# Patient Record
Sex: Male | Born: 1995 | Race: Black or African American | Hispanic: No | Marital: Single | Smoking: Current every day smoker
Health system: Southern US, Community
[De-identification: ages and names within clinical notes are randomized; demographics above are authoritative.]

## PROBLEM LIST (undated history)

## (undated) DIAGNOSIS — F419 Anxiety disorder, unspecified: Secondary | ICD-10-CM

## (undated) DIAGNOSIS — F209 Schizophrenia, unspecified: Secondary | ICD-10-CM

## (undated) HISTORY — PX: WRIST SURGERY: SHX841

## (undated) HISTORY — PX: MANDIBLE SURGERY: SHX707

---
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
## 2013-12-01 ENCOUNTER — Encounter (HOSPITAL_COMMUNITY): Payer: Self-pay | Admitting: Emergency Medicine

## 2013-12-01 ENCOUNTER — Emergency Department (HOSPITAL_COMMUNITY)
Admission: EM | Admit: 2013-12-01 | Discharge: 2013-12-02 | Disposition: A | Discharge status: Other Acute Inpatient Hospital | Payer: BC Managed Care – PPO | Attending: Emergency Medicine | Admitting: Emergency Medicine

## 2013-12-01 DIAGNOSIS — R4689 Other symptoms and signs involving appearance and behavior: Secondary | ICD-10-CM

## 2013-12-01 DIAGNOSIS — F919 Conduct disorder, unspecified: Secondary | ICD-10-CM | POA: Insufficient documentation

## 2013-12-01 DIAGNOSIS — F1210 Cannabis abuse, uncomplicated: Secondary | ICD-10-CM | POA: Insufficient documentation

## 2013-12-01 DIAGNOSIS — F1290 Cannabis use, unspecified, uncomplicated: Secondary | ICD-10-CM

## 2013-12-01 DIAGNOSIS — R41840 Attention and concentration deficit: Secondary | ICD-10-CM | POA: Insufficient documentation

## 2013-12-01 LAB — COMPREHENSIVE METABOLIC PANEL
ALBUMIN: 4.9 g/dL (ref 3.5–5.2)
ALT: 16 U/L (ref 0–53)
AST: 18 U/L (ref 0–37)
Alkaline Phosphatase: 80 U/L (ref 39–117)
BUN: 10 mg/dL (ref 6–23)
CO2: 25 meq/L (ref 19–32)
CREATININE: 1 mg/dL (ref 0.50–1.35)
Calcium: 10 mg/dL (ref 8.4–10.5)
Chloride: 98 mEq/L (ref 96–112)
GFR calc Af Amer: >90 mL/min (ref >90)
Glucose, Bld: 122 mg/dL — ABNORMAL HIGH (ref 70–99)
Potassium: 3.3 mEq/L — ABNORMAL LOW (ref 3.7–5.3)
SODIUM: 138 meq/L (ref 137–147)
Total Bilirubin: 0.9 mg/dL (ref 0.3–1.2)
Total Protein: 8.1 g/dL (ref 6.0–8.3)

## 2013-12-01 LAB — CBC
HCT: 44.3 % (ref 39.0–52.0)
Hemoglobin: 16.2 g/dL (ref 13.0–17.0)
MCH: 30.2 pg (ref 26.0–34.0)
MCHC: 36.6 g/dL — ABNORMAL HIGH (ref 30.0–36.0)
MCV: 82.5 fL (ref 78.0–100.0)
PLATELETS: 240 10*3/uL (ref 150–400)
RBC: 5.37 MIL/uL (ref 4.22–5.81)
RDW: 12 % (ref 11.5–15.5)
WBC: 8 10*3/uL (ref 4.0–10.5)

## 2013-12-01 LAB — RAPID URINE DRUG SCREEN, HOSP PERFORMED
AMPHETAMINES: NOT DETECTED
BENZODIAZEPINES: NOT DETECTED
Barbiturates: NOT DETECTED
Cocaine: NOT DETECTED
Opiates: NOT DETECTED
TETRAHYDROCANNABINOL: POSITIVE — AB

## 2013-12-01 LAB — ACETAMINOPHEN LEVEL: Acetaminophen (Tylenol), Serum: <15 ug/mL (ref 10–30)

## 2013-12-01 LAB — SALICYLATE LEVEL

## 2013-12-01 LAB — ETHANOL: Alcohol, Ethyl (B): <11 mg/dL (ref 0–11)

## 2013-12-01 MED ORDER — ZOLPIDEM TARTRATE 5 MG PO TABS
5.0000 mg | ORAL_TABLET | Freq: Every evening | ORAL | Status: DC | PRN
  Administered 2013-12-01: 5 mg via ORAL
  Filled 2013-12-01: qty 1

## 2013-12-01 MED ORDER — LORAZEPAM 1 MG PO TABS
1.0000 mg | ORAL_TABLET | Freq: Three times a day (TID) | ORAL | Status: DC | PRN
  Administered 2013-12-01 – 2013-12-02 (×2): 1 mg via ORAL
  Filled 2013-12-01 (×3): qty 1

## 2013-12-01 MED ORDER — ALUM & MAG HYDROXIDE-SIMETH 200-200-20 MG/5ML PO SUSP: 30.0000 mL | ORAL | Status: DC | PRN

## 2013-12-01 MED ORDER — ONDANSETRON HCL 4 MG PO TABS: 4.0000 mg | ORAL_TABLET | Freq: Three times a day (TID) | ORAL | Status: DC | PRN

## 2013-12-01 MED ORDER — NICOTINE 21 MG/24HR TD PT24: 21.0000 mg | MEDICATED_PATCH | Freq: Every day | TRANSDERMAL | Status: DC

## 2013-12-01 MED ORDER — IBUPROFEN 200 MG PO TABS: 600.0000 mg | ORAL_TABLET | Freq: Three times a day (TID) | ORAL | Status: DC | PRN

## 2013-12-01 MED ORDER — ACETAMINOPHEN 325 MG PO TABS: 650.0000 mg | ORAL_TABLET | ORAL | Status: DC | PRN

## 2013-12-01 MED ORDER — POTASSIUM CHLORIDE CRYS ER 20 MEQ PO TBCR
40.0000 meq | EXTENDED_RELEASE_TABLET | Freq: Once | ORAL | Status: AC
  Administered 2013-12-01: 40 meq via ORAL
  Filled 2013-12-01: qty 2

## 2013-12-01 NOTE — BH Assessment (Signed)
2200 consulted with Vincent SamFran Hobson, NP and the Patient will be observed overnight and re-assessed in the morning.

## 2013-12-01 NOTE — ED Notes (Signed)
[phone] (Mother) [phone] (Father) to be called if pt transferred

## 2013-12-01 NOTE — BH Assessment (Signed)
Tele assessment scheduled for 2135 tonight.

## 2013-12-01 NOTE — ED Provider Notes (Signed)
CSN: [id number]     Arrival date & time 12/01/13  1922 History  This chart was scribed for non-physician practitioner Antony Madura, PA-C working with Richardean Canal, MD by Dorothey Baseman, ED Scribe. This patient was seen in room [facility] and the patient's care was started at 8:24 PM.    Chief Complaint  Patient presents with  . Medical Clearance   The history is provided by the patient and a parent (mother and father). No language interpreter was used.   HPI Comments: Vincent [name] is a 18 y.o. male who presents to the Emergency Department requesting medical clearance for dysphoric mood, including "thinking and doing random things," "like I'm having too many thoughts and have no control" onset earlier today. Patient also states that he will "stare at the sky" and patient is frequently looking around the room with a dazed appearance and often does not remember questions that were just asked. Patient is unable to specify if these symptoms came on suddenly or gradually, but denies any facilitating factors (i.e. physical trauma, extreme stress), although is father reports that the patient has experienced some recent increased stress secondary to adjusting to college life. His father states that his family noticed these behaviors after church and asked if he was okay, then the patient replied that he wanted to go the hospital. Patient states that these symptoms are new for him, which his parents confirm. Patient admits to marijuana use, but denies knowingly consuming alcohol or other drugs. He denies suicidal or homicidal ideations. Patient has no other pertinent medical history.   History reviewed. No pertinent past medical history. Past Surgical History  Procedure Laterality Date  . Mandible surgery     History reviewed. No pertinent family history. History  Substance Use Topics  . Smoking status: Never Smoker   . Smokeless tobacco: Never Used  . Alcohol Use: No    Review of Systems   Psychiatric/Behavioral: Positive for dysphoric mood and decreased concentration.  All other systems reviewed and are negative.    Allergies  Review of patient's allergies indicates no known allergies.  Home Medications   Current Outpatient Rx  Name  Route  Sig  Dispense  Refill  . Pseudoeph-Doxylamine-DM-APAP (NYQUIL PO)   Oral   Take 30 mLs by mouth every 4 (four) hours as needed (to get high).          Triage Vitals: Pulse 103  Temp(Src) 98.2 F (36.8 C) (Oral)  Resp 16  Ht 6\' 2"  (1.88 m)  Wt 180 lb (81.647 kg)  BMI 23.10 kg/m2  SpO2 99%  Physical Exam  Nursing note and vitals reviewed. Constitutional: He is oriented to person, place, and time. He appears well-developed and well-nourished. No distress.  HENT:  Head: Normocephalic and atraumatic.  Eyes: Conjunctivae and EOM are normal. No scleral icterus.  Neck: Normal range of motion.  Pulmonary/Chest: Effort normal. No respiratory distress.  Musculoskeletal: Normal range of motion.  Neurological: He is alert and oriented to person, place, and time.  GCS 15. Patient answers questions appropriately and follows simple commands. Moves extremities without ataxia. Patient is oriented to person, place, and time, but slow to respond.   Skin: Skin is warm and dry. No rash noted. He is not diaphoretic. No erythema. No pallor.  Psychiatric: His speech is delayed and tangential. He is slowed. He expresses no homicidal and no suicidal ideation. He is inattentive.    ED Course  Procedures (including critical care time)  DIAGNOSTIC STUDIES: Oxygen  Saturation is 99% on room air, normal by my interpretation.    COORDINATION OF CARE: 8:37 PM- Ordered blood labs and UA. Patient will consult to TTS. Will order Zofran, Nicoderm, Mylanta, Ambien, Tylenol, ibuprofen, and Ativan. Discussed treatment plan with patient and family at bedside and both verbalized agreement.    Labs Review Labs Reviewed  CBC - Abnormal; Notable for the  following:    MCHC 36.6 (*)    All other components within normal limits  COMPREHENSIVE METABOLIC PANEL - Abnormal; Notable for the following:    Potassium 3.3 (*)    Glucose, Bld 122 (*)    All other components within normal limits  SALICYLATE LEVEL - Abnormal; Notable for the following:    Salicylate Lvl <2.0 (*)    All other components within normal limits  URINE RAPID DRUG SCREEN (HOSP PERFORMED) - Abnormal; Notable for the following:    Tetrahydrocannabinol POSITIVE (*)    All other components within normal limits  ACETAMINOPHEN LEVEL  ETHANOL   Imaging Review No results found.   EKG Interpretation None      MDM   Final diagnoses:  Behavioral change  Marijuana use   18 year old male with a history of marijuana use presents to the emergency department with his parents after he started to behave unusually. Father states that patient is constantly staring into space and complaining of rushing thoughts. Patient denies SI/HI as well as hallucinations. He endorses smoking marijuana this afternoon, but mother states that this is not new for him and he has been smoking for approximately one year or to help with underlying anxiety. Patient evaluated by behavioral health who recommend observation until the morning followed by psychiatric eval in AM. Patient medically cleared. Temp psych hold orders placed.    I personally performed the services described in this documentation, which was scribed in my presence. The recorded information has been reviewed and is accurate.      Antony MaduraKelly Cynthea Zachman, PA-C 12/01/13 2256

## 2013-12-01 NOTE — ED Notes (Signed)
Per pt's mother, pt has been "talking out of his head and began crying when he was asked what was going on." Pt asked questions about SI/HI or AVH and will shake his head no, but stated "I don't feel safe where I am, but I don't know why." Pt states that today is Sunday, but is not able to state the month or year. Pt oriented to person and place. Pt calm and cooperative at this time. Denies any further needs at this time.

## 2013-12-01 NOTE — ED Notes (Signed)
Patient states that he is here because he just could not control his thoughts or his body; "all sorts of weird things were happening."

## 2013-12-01 NOTE — BH Assessment (Signed)
Tele Assessment Note   Vincent [name] is an 18 y.o. male.  The Patient reported he came to the ED because he was experiencing racing thoughts , AH, and VH.  He reported the racing thoughts were about God and the Devil.  The Patient reported hearing familiar voices but was unable to make out what they were saying.  He reported seeing angel wings in the sky and a Renato Gailsastor with spirits around him.  The Patient reported never experiencing an episode like this in the past.  The Patient denied current or past SI or HI.  The Patient reported smoking one Marijuana blunt after 1730 today.  He reported smoking a blunt daily since the age of 14 years.  He denied any other type of drug use.  The Patient reported experiencing sleep difficulty most nights when he is awakened by dreams and have difficulty getting back to sleep.  The Patient is employed and works at night.  He reported planning to enroll in college.  The Patient lives with his Mother and Sister.  The Patient permitted his Father and Mother to be present during the assessment and they provided collateral information.  The Patient's Mother reported he started acting "weird" in 2014.  She reported [place] had [place] but did returned home  in December 2014 because"he did not feel right."  The Mother reported the Patient had told her about difficulty sleeping at night due to anxiety.  The Patient's Father reported he was behaving normally this morning when they went to church and was fine when he dropped him off at the Mother's house around 1730.  The Father reported the Patient is progressively becoming more coherent than when he first assisted the Mother to transport him to the ED around 1800.  The Patient reported he needs to be currently in the hospital because he is not feeling normal.       Axis I: Psychotic Disorder NOS Axis II: Deferred Axis IV: problems with primary support group Axis V: 21-30 behavior considerably  influenced by delusions or hallucinations OR serious impairment in judgment, communication OR inability to function in almost all areas  Past Medical History: History reviewed. No pertinent past medical history.  Past Surgical History  Procedure Laterality Date  . Mandible surgery      Family History: History reviewed. No pertinent family history.  Social History:  reports that he has never smoked. He has never used smokeless tobacco. He reports that he does not drink alcohol or use illicit drugs.  Additional Social History:     CIWA: CIWA-Ar Pulse Rate: 103 COWS:    Allergies: No Known Allergies  Home Medications:  (Not in a hospital admission)  OB/GYN Status:  No LMP for male patient.  General Assessment Data Location of Assessment: [facility] ACT Assessment: Yes Is this a Tele or Face-to-Face Assessment?: Tele Assessment Is this an Initial Assessment or a Re-assessment for this encounter?: Initial Assessment Living Arrangements:  (live with Parents) Can pt return to current living arrangement?: Yes Admission Status: Voluntary Is patient capable of signing voluntary admission?: Yes Transfer from: Home Referral Source: Self/Family/Friend  Medical Screening Exam [facility] Walk-in ONLY) Medical Exam completed: Yes  [facility] Living Arrangements:  (live with Parents) Name of Psychiatrist: None Name of Therapist: None  Education Status Is patient currently in school?: No Current Grade: N/A Highest grade of school patient has completed: N/A Name of school: N/A Contact person: N/A  Risk to self Suicidal Ideation: No Suicidal Intent: No Is patient at risk for suicide?: No Suicidal Plan?: No Access to Means: No What has been your use of drugs/alcohol within the last 12 months?: smoked Marijuana today Previous Attempts/Gestures: No How many times?: 0 Other Self Harm Risks: N/A Triggers for Past Attempts: None known Intentional Self Injurious  Behavior: None Family Suicide History: Unknown Recent stressful life event(s):  (attending college away from home) Persecutory voices/beliefs?: No Depression: No Depression Symptoms:  (None) Substance abuse history and/or treatment for substance abuse?: Yes Suicide prevention information given to non-admitted patients: Not applicable  Risk to Others Homicidal Ideation: No Thoughts of Harm to Others: No Current Homicidal Intent: No Current Homicidal Plan: No Access to Homicidal Means: No Identified Victim: N/A History of harm to others?: No Assessment of Violence: None Noted Violent Behavior Description: N/A Does patient have access to weapons?: No Criminal Charges Pending?: No Does patient have a court date: No  Psychosis Hallucinations: Auditory;Visual Delusions: None noted  Mental Status Report Appear/Hygiene:  (clean and neat) Eye Contact: Poor Motor Activity: Restlessness Speech: Other (Comment) (mostly logical and coherent) Level of Consciousness: Alert Mood: Anxious;Apprehensive Affect: Other (Comment) (periodically distracted) Anxiety Level: Moderate Thought Processes: Coherent (most of the time) Judgement: Impaired Orientation: Person;Place;Time Obsessive Compulsive Thoughts/Behaviors: None  Cognitive Functioning Concentration: Decreased Memory: Recent Impaired IQ: Average Insight: Poor Impulse Control: Poor Appetite: Fair Weight Loss: 0 Weight Gain: 0 Sleep: Decreased Total Hours of Sleep: 6 Vegetative Symptoms: None  ADLScreening [facility] Assessment Services) Patient's cognitive ability adequate to safely complete daily activities?: Yes Patient able to express need for assistance with ADLs?: Yes Independently performs ADLs?: Yes (appropriate for developmental age)  Prior Inpatient Therapy Prior Inpatient Therapy: No Prior Therapy Dates: N/A Prior Therapy Facilty/Provider(s): N/A Reason for Treatment: N/A  Prior Outpatient Therapy Prior  Outpatient Therapy: No Prior Therapy Dates: N/A Prior Therapy Facilty/Provider(s): N/QA Reason for Treatment: N/A  ADL Screening (condition at time of admission) Patient's cognitive ability adequate to safely complete daily activities?: Yes Is the patient deaf or have difficulty hearing?: No Does the patient have difficulty seeing, even when wearing glasses/contacts?: No Does the patient have difficulty concentrating, remembering, or making decisions?: No Patient able to express need for assistance with ADLs?: Yes Does the patient have difficulty dressing or bathing?: No Independently performs ADLs?: Yes (appropriate for developmental age) Does the patient have difficulty walking or climbing stairs?: No Weakness of Legs: None Weakness of Arms/Hands: None  Home Assistive Devices/Equipment Home Assistive Devices/Equipment: None    Abuse/Neglect Assessment (Assessment to be complete while patient is alone) Physical Abuse: Denies Verbal Abuse: Denies Sexual Abuse: Denies Exploitation of patient/patient's resources: Denies Self-Neglect: Denies Values / Beliefs Cultural Requests During Hospitalization: None Spiritual Requests During Hospitalization: None        Additional Information 1:1 In Past 12 Months?: No CIRT Risk: No Elopement Risk: No Does patient have medical clearance?: Yes     Disposition:  Disposition Initial Assessment Completed for this Encounter: Yes Disposition of Patient: Other dispositions (overnight observation and re-assessment in the A.M.) Other disposition(s): Other (Comment) (None)  Dey-Johnson,Happy Begeman 12/01/2013 10:15 PM

## 2013-12-02 ENCOUNTER — Encounter (HOSPITAL_COMMUNITY): Payer: Self-pay | Admitting: *Deleted

## 2013-12-02 ENCOUNTER — Inpatient Hospital Stay (HOSPITAL_COMMUNITY)
Admission: AD | Admit: 2013-12-02 | Discharge: 2013-12-06 | DRG: 885 | Disposition: A | Discharge status: Home / Self Care | Payer: BC Managed Care – PPO | Source: Intra-hospital | Attending: Psychiatry | Admitting: Psychiatry

## 2013-12-02 DIAGNOSIS — F1220 Cannabis dependence, uncomplicated: Secondary | ICD-10-CM | POA: Diagnosis present

## 2013-12-02 DIAGNOSIS — F39 Unspecified mood [affective] disorder: Secondary | ICD-10-CM | POA: Diagnosis present

## 2013-12-02 DIAGNOSIS — F29 Unspecified psychosis not due to a substance or known physiological condition: Secondary | ICD-10-CM

## 2013-12-02 DIAGNOSIS — F12250 Cannabis dependence with psychotic disorder with delusions: Secondary | ICD-10-CM | POA: Diagnosis present

## 2013-12-02 DIAGNOSIS — F22 Delusional disorders: Secondary | ICD-10-CM | POA: Diagnosis present

## 2013-12-02 HISTORY — DX: Anxiety disorder, unspecified: F41.9

## 2013-12-02 MED ORDER — NICOTINE 21 MG/24HR TD PT24
21.0000 mg | MEDICATED_PATCH | Freq: Every day | TRANSDERMAL | Status: DC
  Filled 2013-12-02 (×3): qty 1

## 2013-12-02 MED ORDER — ACETAMINOPHEN 325 MG PO TABS: 650.0000 mg | ORAL_TABLET | ORAL | Status: DC | PRN

## 2013-12-02 MED ORDER — IBUPROFEN 200 MG PO TABS: 600.0000 mg | ORAL_TABLET | Freq: Three times a day (TID) | ORAL | Status: DC | PRN

## 2013-12-02 MED ORDER — POTASSIUM CHLORIDE CRYS ER 20 MEQ PO TBCR
10.0000 meq | EXTENDED_RELEASE_TABLET | Freq: Every day | ORAL | Status: DC
  Administered 2013-12-02: 10 meq via ORAL
  Filled 2013-12-02: qty 0.5

## 2013-12-02 MED ORDER — LORAZEPAM 1 MG PO TABS
1.0000 mg | ORAL_TABLET | Freq: Three times a day (TID) | ORAL | Status: DC | PRN
  Administered 2013-12-03 – 2013-12-06 (×4): 1 mg via ORAL
  Filled 2013-12-02 (×5): qty 1

## 2013-12-02 MED ORDER — ALUM & MAG HYDROXIDE-SIMETH 200-200-20 MG/5ML PO SUSP: 30.0000 mL | ORAL | Status: DC | PRN

## 2013-12-02 MED ORDER — ZOLPIDEM TARTRATE 5 MG PO TABS
5.0000 mg | ORAL_TABLET | Freq: Every evening | ORAL | Status: DC | PRN
  Administered 2013-12-02: 5 mg via ORAL
  Filled 2013-12-02: qty 1

## 2013-12-02 MED ORDER — MAGNESIUM HYDROXIDE 400 MG/5ML PO SUSP: 30.0000 mL | Freq: Every day | ORAL | Status: DC | PRN

## 2013-12-02 MED ORDER — ONDANSETRON HCL 4 MG PO TABS: 4.0000 mg | ORAL_TABLET | Freq: Three times a day (TID) | ORAL | Status: DC | PRN

## 2013-12-02 MED ORDER — POTASSIUM CHLORIDE CRYS ER 10 MEQ PO TBCR
10.0000 meq | EXTENDED_RELEASE_TABLET | Freq: Every day | ORAL | Status: DC
  Administered 2013-12-03 – 2013-12-06 (×4): 10 meq via ORAL
  Filled 2013-12-02 (×7): qty 1

## 2013-12-02 NOTE — Tx Team (Signed)
Initial Interdisciplinary Treatment Plan  PATIENT STRENGTHS: (choose at least two) Average or above average intelligence Capable of independent living Communication skills Supportive family/friends Work skills  PATIENT STRESSORS: Educational concerns Substance abuse   PROBLEM LIST: Problem List/Patient Goals Date to be addressed Date deferred Reason deferred Estimated date of resolution  Substance abuse (THC) 12/02/13     Altered thought processes 12/02/13                                                DISCHARGE CRITERIA:  Ability to meet basic life and health needs Adequate post-discharge living arrangements Reduction of life-threatening or endangering symptoms to within safe limits Verbal commitment to aftercare and medication compliance  PRELIMINARY DISCHARGE PLAN: Return to previous living arrangement Return to previous work or school arrangements  PATIENT/FAMIILY INVOLVEMENT: This treatment plan has been presented to and reviewed with the patient, Vincent [name].  The patient and family have been given the opportunity to ask questions and make suggestions.  Vincent [name], Vincent [name] 12/02/2013, 6:25 PM

## 2013-12-02 NOTE — Progress Notes (Signed)
Patient ID: Vincent [name], male   DOB: 04/26/1996, 18 y.o.   MRN: [id number] Nursing admission note:  This is first psyche admission for this 18 yo male.  When he came into the ED, he reported racing thoughts about God and the Devil.  Patient stated that he was hearing voices recently, however, is negative today.  Patient states, "there are people I can't trust."  He stated, "people think I'm nonchalant at times and I frustrate others."  When asked if he had visual hallucinations, he states, "sometimes I have a breakthrough and I see a small rainbow with the color faces.  I took it as a signal or a clarification."  Patient reports smoking week "about 50 times a week."  He states that he has been smoking since the age of 18.  His last use was 3/15.  Patient is employed and works at [employer].  Patient lives with his mother and sister in [place].  He states that his dad and mom have joint custody.  Patient's mother stated he has been acting "weird."  Patient reports increased anxiety; states his sleep has been well.  He denies any depressive symptoms.  Patient has not prior medical history.  He denies any other drug or alcohol use.  Patient was oriented to room and unit.

## 2013-12-02 NOTE — Consult Note (Signed)
[place] [facility] Consult   Reason for Consult:  Psychosis or confusion Referring Physician:  ED  Vincent [name] is an 18 y.o. male. Total Time spent with patient: 30 minutes  Assessment: AXIS I:  Psychosis unspecified. Rule out substance induced psychosis AXIS II:  Deferred AXIS III:  History reviewed. No pertinent past medical history. AXIS IV:  other psychosocial or environmental problems AXIS V:  31-40 impairment in reality testing  Plan:  Recommend psychiatric Inpatient admission when medically cleared.  Subjective:   Vincent [name] is a 18 y.o. male patient admitted with being dazed and random things are happening to me.  HPI:  Vincent [name] is an 18 y.o. male. Presented initially with the following The Patient reported he came to the ED because he was experiencing racing thoughts , AH, and VH. He reported the racing thoughts were about God and the Devil. The Patient reported hearing familiar voices but was unable to make out what they were saying. He reported seeing angel wings in the sky and a Doristine Bosworth with spirits around him. The Patient reported never experiencing an episode like this in the past. The Patient denied current or past SI or HI. The Patient reported smoking one Marijuana blunt after 1730 today. He reported smoking a blunt daily since the age of 19 years. He denied any other type of drug use. The Patient reported experiencing sleep difficulty most nights when he is awakened by dreams and have difficulty getting back to sleep. The Patient is employed and works at night. He reported planning to enroll in college. On evaluation today still appears vague, endorsed being controlled by things. Endorsed visual and auditory hallucinations but not specific of what they are saying. Still believes TV may send him messages.  Has no prior psych history but endorsed using recent marijuana use.   HPI Elements:   Location:  psychosis. Quality:   moderate. Severity:  new.  Past Psychiatric History: History reviewed. No pertinent past medical history.  reports that he has never smoked. He has never used smokeless tobacco. He reports that he does not drink alcohol or use illicit drugs. History reviewed. No pertinent family history. Family History Substance Abuse: Yes, Describe: (smokes a Marijuana blunt daily) Family Supports: Yes, List: (Father, Mother, Sister) Living Arrangements:  (live with Parents) Can pt return to current living arrangement?: Yes Abuse/Neglect [facility]) Physical Abuse: Denies Verbal Abuse: Denies Sexual Abuse: Denies Allergies:  No Known Allergies  ACT Assessment Complete:  Yes:    Educational Status    Risk to Self: Risk to self Suicidal Ideation: No Suicidal Intent: No Is patient at risk for suicide?: No Suicidal Plan?: No Access to Means: No What has been your use of drugs/alcohol within the last 12 months?: smoked Marijuana today Previous Attempts/Gestures: No How many times?: 0 Other Self Harm Risks: N/A Triggers for Past Attempts: None known Intentional Self Injurious Behavior: None Family Suicide History: Unknown Recent stressful life event(s):  (attending college away from home) Persecutory voices/beliefs?: No Depression: No Depression Symptoms:  (None) Substance abuse history and/or treatment for substance abuse?: Yes Suicide prevention information given to non-admitted patients: Not applicable  Risk to Others: Risk to Others Homicidal Ideation: No Thoughts of Harm to Others: No Current Homicidal Intent: No Current Homicidal Plan: No Access to Homicidal Means: No Identified Victim: N/A History of harm to others?: No Assessment of Violence: None Noted Violent Behavior Description: N/A Does patient have access to weapons?: No Criminal Charges Pending?:  No Does patient have a court date: No  Abuse: Abuse/Neglect Assessment (Assessment to be complete while patient is alone) Physical  Abuse: Denies Verbal Abuse: Denies Sexual Abuse: Denies Exploitation of patient/patient's resources: Denies Self-Neglect: Denies  Prior Inpatient Therapy: Prior Inpatient Therapy Prior Inpatient Therapy: No Prior Therapy Dates: N/A Prior Therapy Facilty/Provider(s): N/A Reason for Treatment: N/A  Prior Outpatient Therapy: Prior Outpatient Therapy Prior Outpatient Therapy: No Prior Therapy Dates: N/A Prior Therapy Facilty/Provider(s): N/QA Reason for Treatment: N/A  Additional Information: Additional Information 1:1 In Past 12 Months?: No CIRT Risk: No Elopement Risk: No Does patient have medical clearance?: Yes                  Objective: Blood pressure 145/87, pulse 58, temperature 98 F (36.7 C), temperature source Oral, resp. rate 20, height 6' 2" (1.88 m), weight 81.647 kg (180 lb), SpO2 100.00%.Body mass index is 23.1 kg/(m^2). Results for orders placed during the hospital encounter of 12/01/13 (from the past 72 hour(s))  ACETAMINOPHEN LEVEL     Status: None   Collection Time    12/01/13  8:19 PM      Result Value Ref Range   Acetaminophen (Tylenol), Serum <15.0  10 - 30 ug/mL   Comment:            THERAPEUTIC CONCENTRATIONS VARY     SIGNIFICANTLY. A RANGE OF 10-30     ug/mL MAY BE AN EFFECTIVE     CONCENTRATION FOR MANY PATIENTS.     HOWEVER, SOME ARE BEST TREATED     AT CONCENTRATIONS OUTSIDE THIS     RANGE.     ACETAMINOPHEN CONCENTRATIONS     >150 ug/mL AT 4 HOURS AFTER     INGESTION AND >50 ug/mL AT 12     HOURS AFTER INGESTION ARE     OFTEN ASSOCIATED WITH TOXIC     REACTIONS.  CBC     Status: Abnormal   Collection Time    12/01/13  8:19 PM      Result Value Ref Range   WBC 8.0  4.0 - 10.5 K/uL   RBC 5.37  4.22 - 5.81 MIL/uL   Hemoglobin 16.2  13.0 - 17.0 g/dL   HCT 44.3  39.0 - 52.0 %   MCV 82.5  78.0 - 100.0 fL   MCH 30.2  26.0 - 34.0 pg   MCHC 36.6 (*) 30.0 - 36.0 g/dL   RDW 12.0  11.5 - 15.5 %   Platelets 240  150 - 400 K/uL   COMPREHENSIVE METABOLIC PANEL     Status: Abnormal   Collection Time    12/01/13  8:19 PM      Result Value Ref Range   Sodium 138  137 - 147 mEq/L   Potassium 3.3 (*) 3.7 - 5.3 mEq/L   Chloride 98  96 - 112 mEq/L   CO2 25  19 - 32 mEq/L   Glucose, Bld 122 (*) 70 - 99 mg/dL   BUN 10  6 - 23 mg/dL   Creatinine, Ser 1.00  0.50 - 1.35 mg/dL   Calcium 10.0  8.4 - 10.5 mg/dL   Total Protein 8.1  6.0 - 8.3 g/dL   Albumin 4.9  3.5 - 5.2 g/dL   AST 18  0 - 37 U/L   ALT 16  0 - 53 U/L   Alkaline Phosphatase 80  39 - 117 U/L   Total Bilirubin 0.9  0.3 - 1.2 mg/dL   GFR calc non  Af Amer >90  >90 mL/min   GFR calc Af Amer >90  >90 mL/min   Comment: (NOTE)     The eGFR has been calculated using the CKD EPI equation.     This calculation has not been validated in all clinical situations.     eGFR's persistently <90 mL/min signify possible Chronic Kidney     Disease.  ETHANOL     Status: None   Collection Time    12/01/13  8:19 PM      Result Value Ref Range   Alcohol, Ethyl (B) <11  0 - 11 mg/dL   Comment:            LOWEST DETECTABLE LIMIT FOR     SERUM ALCOHOL IS 11 mg/dL     FOR MEDICAL PURPOSES ONLY  SALICYLATE LEVEL     Status: Abnormal   Collection Time    12/01/13  8:19 PM      Result Value Ref Range   Salicylate Lvl <5.8 (*) 2.8 - 20.0 mg/dL  URINE RAPID DRUG SCREEN (HOSP PERFORMED)     Status: Abnormal   Collection Time    12/01/13  8:21 PM      Result Value Ref Range   Opiates NONE DETECTED  NONE DETECTED   Cocaine NONE DETECTED  NONE DETECTED   Benzodiazepines NONE DETECTED  NONE DETECTED   Amphetamines NONE DETECTED  NONE DETECTED   Tetrahydrocannabinol POSITIVE (*) NONE DETECTED   Barbiturates NONE DETECTED  NONE DETECTED   Comment:            DRUG SCREEN FOR MEDICAL PURPOSES     ONLY.  IF CONFIRMATION IS NEEDED     FOR ANY PURPOSE, NOTIFY LAB     WITHIN 5 DAYS.                LOWEST DETECTABLE LIMITS     FOR URINE DRUG SCREEN     Drug Class       Cutoff  (ng/mL)     Amphetamine      1000     Barbiturate      200     Benzodiazepine   850     Tricyclics       277     Opiates          300     Cocaine          300     THC              50   Labs are reviewed and are pertinent for marijuana.  Current Facility-Administered Medications  Medication Dose Route Frequency Provider Last Rate Last Dose  . acetaminophen (TYLENOL) tablet 650 mg  650 mg Oral Q4H PRN Antonietta Breach, PA-C      . alum & mag hydroxide-simeth (MAALOX/MYLANTA) 200-200-20 MG/5ML suspension 30 mL  30 mL Oral PRN Antonietta Breach, PA-C      . ibuprofen (ADVIL,MOTRIN) tablet 600 mg  600 mg Oral Q8H PRN Antonietta Breach, PA-C      . LORazepam (ATIVAN) tablet 1 mg  1 mg Oral Q8H PRN Antonietta Breach, PA-C   1 mg at 12/01/13 2236  . nicotine (NICODERM CQ - dosed in mg/24 hours) patch 21 mg  21 mg Transdermal Daily Antonietta Breach, PA-C      . ondansetron [facility]) tablet 4 mg  4 mg Oral Q8H PRN Antonietta Breach, PA-C      . potassium chloride SA (K-DUR,KLOR-CON) CR tablet 10  mEq  10 mEq Oral Daily Waylan Boga, NP   10 mEq at 12/02/13 1100  . zolpidem (AMBIEN) tablet 5 mg  5 mg Oral QHS PRN Antonietta Breach, PA-C   5 mg at 12/01/13 2236   Current Outpatient Prescriptions  Medication Sig Dispense Refill  . Pseudoeph-Doxylamine-DM-APAP (NYQUIL PO) Take 30 mLs by mouth every 4 (four) hours as needed (to get high).        Psychiatric Specialty Exam:     Blood pressure 145/87, pulse 58, temperature 98 F (36.7 C), temperature source Oral, resp. rate 20, height 6' 2" (1.88 m), weight 81.647 kg (180 lb), SpO2 100.00%.Body mass index is 23.1 kg/(m^2).  General Appearance: Guarded  Eye Contact::  Poor  Speech:  Slow  Volume:  Decreased  Mood:  Dysphoric  Affect:  Restricted  Thought Process:  Disorganized  Orientation:  Full (Time, Place, and Person)  Thought Content:  Hallucinations: Auditory and Paranoid Ideation  Suicidal Thoughts:  No  Homicidal Thoughts:  No  Memory:  Recent;   Fair  Judgement:  Poor   Insight:  Shallow  Psychomotor Activity:  Decreased  Concentration:  Fair  Recall:  [place]: Fair  Akathisia:  Negative  Handed:  Right  AIMS (if indicated):     Assets:  Desire for Improvement Financial Resources/Insurance Resilience Social Support Vocational/Educational  Sleep:      Musculoskeletal: Strength & Muscle Tone: within normal limits Gait & Station: normal Patient leans: N/A  Treatment Plan Summary: Daily contact with patient to assess and evaluate symptoms and progress in treatment Medication management Admit to Inpatient unit for stabilization.  Rule out substance induced psychosis. If psychosis doesn't clear consider anti-psychotic. Family history negative for schizophrenia.  De Nurse,  MD 12/02/2013 11:50 AM

## 2013-12-02 NOTE — BH Assessment (Signed)
[facility]22[facility]; [facility]. Carney Bernatherine C Ronney [employer], LCSW

## 2013-12-02 NOTE — ED Notes (Signed)
[facility] here for transport.

## 2013-12-02 NOTE — Progress Notes (Signed)
[facility] Group Notes:  (Nursing/MHT/Case Management/Adjunct)  Date:  12/02/2013  Time:  8:00 p.m.   Type of Therapy:  Psychoeducational Skills  Participation Level:  None  Participation Quality:  Inattentive  Affect:  Depressed  Cognitive:  Lacking  Insight:  None  Engagement in Group:  None  Modes of Intervention:  Education  Summary of Progress/Problems: The patient attended group this evening,but refused to share.   Hazle CocaGOODMAN, Jhada Risk S 12/02/2013, 10:48 PM

## 2013-12-03 ENCOUNTER — Encounter (HOSPITAL_COMMUNITY): Payer: Self-pay | Admitting: Psychiatry

## 2013-12-03 DIAGNOSIS — F12250 Cannabis dependence with psychotic disorder with delusions: Secondary | ICD-10-CM | POA: Diagnosis present

## 2013-12-03 DIAGNOSIS — F1220 Cannabis dependence, uncomplicated: Secondary | ICD-10-CM | POA: Diagnosis present

## 2013-12-03 DIAGNOSIS — F19950 Other psychoactive substance use, unspecified with psychoactive substance-induced psychotic disorder with delusions: Secondary | ICD-10-CM

## 2013-12-03 MED ORDER — OLANZAPINE 10 MG PO TBDP
ORAL_TABLET | ORAL | Status: AC
  Administered 2013-12-03: 10 mg via ORAL
  Filled 2013-12-03: qty 1

## 2013-12-03 MED ORDER — RISPERIDONE 1 MG PO TABS
1.0000 mg | ORAL_TABLET | Freq: Every day | ORAL | Status: DC
  Administered 2013-12-03: 1 mg via ORAL
  Filled 2013-12-03 (×2): qty 1

## 2013-12-03 MED ORDER — TRAZODONE HCL 50 MG PO TABS
50.0000 mg | ORAL_TABLET | Freq: Every evening | ORAL | Status: DC | PRN
  Administered 2013-12-04 – 2013-12-05 (×2): 50 mg via ORAL
  Filled 2013-12-03 (×2): qty 1
  Filled 2013-12-03: qty 14

## 2013-12-03 MED ORDER — OLANZAPINE 10 MG PO TBDP
10.0000 mg | ORAL_TABLET | Freq: Three times a day (TID) | ORAL | Status: DC | PRN
  Administered 2013-12-03 – 2013-12-04 (×3): 10 mg via ORAL
  Filled 2013-12-03: qty 1
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
## 2013-12-03 NOTE — [facility] Group Notes (Signed)
[facility] LCSW Group Therapy  12/03/2013 , 2:45 PM   Type of Therapy:  Group Therapy  Participation Level: Invited  Chose not to attend  Summary of Progress/Problems: Today's group focused on the term Diagnosis.  Participants were asked to define the term, and then pronounce whether it is a negative, positive or neutral term.  Daryel Geraldorth, Kendarrius B 12/03/2013 , 2:45 PM

## 2013-12-03 NOTE — Treatment Plan (Signed)
Pt signed a 72 hour request for discharge on 3/17 at 1830 that urging of his cousin and friend.

## 2013-12-03 NOTE — [facility] Group Notes (Signed)
[facility] Group Notes:  (Nursing/MHT/Case Management/Adjunct)  Date:  12/03/2013  Time: 0900 am  Type of Therapy:  Psychoeducational Skills  Participation Level:  Did Not Attend   Cranford MonBeaudry, Leeza Heiner Evans 12/03/2013, 1:25 PM

## 2013-12-03 NOTE — [facility] Suicide Risk Assessment (Signed)
   Nursing information obtained from:    Demographic factors:    Current Mental Status:    Loss Factors:    Historical Factors:    Risk Reduction Factors:    Total Time spent with patient: 30 minutes  CLINICAL FACTORS:   Severe Anxiety and/or Agitation Alcohol/Substance Abuse/Dependencies Currently Psychotic  Psychiatric Specialty Exam: Physical Exam  Psychiatric: His speech is normal. His mood appears anxious. He is agitated and actively hallucinating. Thought content is paranoid and delusional. Cognition and memory are normal. He expresses impulsivity. He exhibits a depressed mood.    Review of Systems  Constitutional: Negative.   HENT: Negative.   Eyes: Negative.   Respiratory: Negative.   Cardiovascular: Negative.   Gastrointestinal: Negative.   Genitourinary: Negative.   Musculoskeletal: Negative.   Skin: Negative.   Neurological: Negative.   Endo/Heme/Allergies: Negative.   Psychiatric/Behavioral: Positive for depression, hallucinations and substance abuse. The patient is nervous/anxious and has insomnia.     Blood pressure 147/99, pulse 102, temperature 98 F (36.7 C), temperature source Oral, resp. rate 18, height 6\' 2"  (1.88 m), weight 73.029 kg (161 lb).Body mass index is 20.66 kg/(m^2).  General Appearance: Fairly Groomed  Patent attorneyye Contact::  Good  Speech:  Clear and Coherent and Normal Rate  Volume:  Normal  Mood:  Anxious and Irritable  Affect:  Tearful  Thought Process:  Goal Directed  Orientation:  Full (Time, Place, and Person)  Thought Content:  Delusions and Hallucinations: Auditory  Suicidal Thoughts:  No  Homicidal Thoughts:  No  Memory:  Immediate;   Fair Recent;   Fair Remote;   Fair  Judgement:  Impaired  Insight:  Shallow  Psychomotor Activity:  Increased  Concentration:  Fair  Recall:  [employer]  Fund of Knowledge:Good  Language: Good  Akathisia:  No  Handed:  Right  AIMS (if indicated):     Assets:  Communication Skills Desire for  Improvement Physical Health  Sleep:  Number of Hours: 6.25   Musculoskeletal: Strength & Muscle Tone: within normal limits Gait & Station: normal Patient leans: N/A  COGNITIVE FEATURES THAT CONTRIBUTE TO RISK:  Closed-mindedness Polarized thinking    SUICIDE RISK:   Minimal: No identifiable suicidal ideation.  Patients presenting with no risk factors but with morbid ruminations; may be classified as minimal risk based on the severity of the depressive symptoms  PLAN OF CARE:1. Admit for crisis management and stabilization. 2. Medication management to reduce current symptoms to base line and improve the     patient's overall level of functioning 3. Treat health problems as indicated. 4. Develop treatment plan to decrease risk of relapse upon discharge and the need for     readmission. 5. Psycho-social education regarding relapse prevention and self care. 6. Health care follow up as needed for medical problems. 7. Restart home medications where appropriate.   I certify that inpatient services furnished can reasonably be expected to improve the patient's condition.  Shandria Clinch,MD 12/03/2013, 9:57 AM

## 2013-12-03 NOTE — Progress Notes (Signed)
Patient ID: Vincent [name], male   DOB: 04/23/1996, 18 y.o.   MRN: [id number]  D: Writer attempted to assess the pt but found it difficult. Pt appeared to have IS and admitted to hearing voices. Pt was very paranoid, stopping in mid sentence at times to stare down the hall, and would then comment as if he was being spoken to.  Pt eventually looked at the writer and told the Clinical research associatewriter that he "doesn't trust anybody".  Writer informed pt that all staff could be trusted. Pt then asked about "people like him, wearing what he wears" pointing to his scrubs. Writer assured pt that he is safe in the hosp.  A:  Support and encouragement was offered. 15 min checks continued for safety.  R: Pt remains safe.
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
## 2013-12-03 NOTE — Progress Notes (Signed)
Patient ID: Vincent [name], male   DOB: 02/09/1996, 18 y.o.   MRN: [id number] Patient's mother called regarding status of her son.  She expressed her concern over his bizarre behavior.  She states that her son received a "football scholarship to [employer]."  She admits that she was aware of her son's use of marijuana stating, "we have an open relationship."  She told staff that he has never acted like this before.  Mother was assured that she is now on the consent list and she may receive information about her care.  Mother is vested in her son's treatment.  Informed mother that he has received medication for his anxiety/agitation and is resting quietly at the moment.
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
## 2013-12-03 NOTE — Progress Notes (Signed)
Patient ID: Caryl PinaRodney D Roberts Knight, male   DOB: 09/09/1996, 18 y.o.   MRN: [id number] D: patient was observed in the day room exhibiting agitated behavior.  He was actively sobbing and was having a difficult time calming himself.  Patient was given 1 mg ativan and staff talked with him extensively.  Patient then went to his room and MHT came to get nurse because patient was cursing and punching the wall.  He had also been observed wrapping his bedspread around his head and talking to himself.  Patient was extremely agitated.  He is able to redirected at this time.  Patient was given 10 mg zyprexa within minutes of the ativan 1 mg.  Patient is currently resting quietly.

## 2013-12-03 NOTE — Progress Notes (Signed)
Patient ID: Vincent [name], male   DOB: [birth year], 18 y.o.   MRN: [id number] D: patient met with treatment team to discuss his reason for being here.  Patient had a difficult time putting his words together and processing his thoughts.  He admitted to hearing voices and became very tearful during the interview.  He denies any SI/HI.  He did not attend group this morning, electing to stay in bed.  Patient exhibits paranoia and suspiciousness.  He admitted to "not trusting people."  He appears to be responding to internal stimuli.  He has flat, tearful affect. A: continue to monitor medication management and MD orders.  Safety checks completed every 15 minutes per protocol.  R: patient is receptive to staff with minimal interaction.

## 2013-12-03 NOTE — Tx Team (Signed)
  Interdisciplinary Treatment Plan Update   Date Reviewed:  12/03/2013  Time Reviewed:  8:14 AM  Progress in Treatment:   Attending groups: No Participating in groups:No Taking medication as prescribed: Yes  Tolerating medication: Yes Family/Significant other contact made: No  Patient understands diagnosis: Yes AEB asking for help with psychosis, anxiety Discussing patient identified problems/goals with staff: Yes See initial care plan Medical problems stabilized or resolved: Yes Denies suicidal/homicidal ideation: Yes  In tx team Patient has not harmed self or others: Yes  For review of initial/current patient goals, please see plan of care.  Estimated Length of Stay:  4-5 days  Reason for Continuation of Hospitalization: Anxiety Hallucinations Medication stabilization  New Problems/Goals identified:  N/A  Discharge Plan or Barriers:   return home, follow up outp  Additional Comments:  This is first psych admission for this 18 yo male. When he came into the ED, he reported racing thoughts about God and the Devil. Patient stated that he was hearing voices recently, however, is negative today. Patient states, "there are people I can't trust." He stated, "people think I'm nonchalant at times and I frustrate others." When asked if he had visual hallucinations, he states, "sometimes I have a breakthrough and I see a small rainbow with the color faces. I took it as a signal or a clarification." Patient reports smoking week "about 50 times a week." He states that he has been smoking since the age of 18. His last use was 3/15. Patient is employed and works at [employer].   Attendees:  Signature: Thedore MinsMojeed Akintayo, MD 12/03/2013 8:14 AM   Signature: Richelle Itood Jeaneen Cala, LCSW 12/03/2013 8:14 AM  Signature: Fransisca KaufmannLaura Davis, NP 12/03/2013 8:14 AM  Signature: Joslyn Devonaroline Beaudry, RN 12/03/2013 8:14 AM  Signature: Liborio NixonPatrice White, RN 12/03/2013 8:14 AM  Signature:  12/03/2013 8:14 AM  Signature:   12/03/2013 8:14 AM   Signature:    Signature:    Signature:    Signature:    Signature:    Signature:      Scribe for Treatment Team:   Richelle Itood Nelda Luckey, LCSW  12/03/2013 8:14 AM

## 2013-12-04 DIAGNOSIS — F39 Unspecified mood [affective] disorder: Secondary | ICD-10-CM

## 2013-12-04 MED ORDER — RISPERIDONE 2 MG PO TABS
2.0000 mg | ORAL_TABLET | Freq: Every day | ORAL | Status: DC
  Administered 2013-12-04 – 2013-12-05 (×2): 2 mg via ORAL
  Filled 2013-12-04 (×2): qty 1
  Filled 2013-12-04: qty 14
  Filled 2013-12-04: qty 1

## 2013-12-04 MED ORDER — CLONIDINE HCL 0.1 MG PO TABS: 0.1000 mg | ORAL_TABLET | Freq: Three times a day (TID) | ORAL | Status: DC | PRN

## 2013-12-04 NOTE — Progress Notes (Signed)
Patient ID: Caryl PinaRodney D Roberts Knight, male   DOB: 07/19/1996, 18 y.o.   MRN: [id number]   D: Pt stayed in bed during the shift. Writer went into room and woke pt to adm his hs meds. When asked about his day pt stated it was "ok".  After Investment banker, corporateassessment writer asked pt if he had any questions or concerns. Pt denied having either. Writer encouraged pt to go to the dayroom and get his hs snacks. Pt stated he would go, but it was later reported that pt did not leave the room.   A:  Support and encouragement was offered. 15 min checks continued for safety.  R: Pt remains safe.

## 2013-12-04 NOTE — Progress Notes (Signed)
Psychoeducational Group Note  Date:  12/04/2013 Time:  2000  Group Topic/Focus:  Wrap-Up Group:   The focus of this group is to help patients review their daily goal of treatment and discuss progress on daily workbooks.  Participation Level: Did Not Attend  Participation Quality:  Not Applicable  Affect:  Not Applicable  Cognitive:  Not Applicable  Insight:  Not Applicable  Engagement in Group: Not Applicable  Additional Comments:    Flonnie HailstoneCOOKE, Bridget  R 12/04/2013, 10:15 PM

## 2013-12-04 NOTE — Progress Notes (Signed)
Patient ID: Vincent [name], male   DOB: 02/12/1996, 18 y.o.   MRN: [id number] D: patient has been observed in the hallway jumping up and down talking to himself.  He told staff, "I have to talk to my dad."  Patient is restless and is refusing any prn medications.  Patient told staff, "I would like to at least listen to some music."  MD approved the use of the hospital's radio and patient was pleased.  Patient stated, "I'm pretty sure that I've been here as a little kid.  I think I came here when I broke my jaw."  He remains with disorganized thinking and tangental speech.  Patient was exhibiting such agitation that staff kept him back on the unit from recreation.  He was informed that if he remained on the unit and he could show that he could control himself, he would be allowed to go to afternoon recreation. He denies any SI/HI/AVH.  He does appear to be responding to internal stimuli.  He has been observed talking to himself.  Mother called and expressed her concern that patient may have taken a drug that has contributed to his psychosis.  A: continue to monitor medication management and MD orders.  Patient's behavior monitored and medicate patient as needed.  R: patient is able to be redirected.

## 2013-12-04 NOTE — Progress Notes (Signed)
Patient ID: Vincent [name], male   DOB: 06-11-1996, 18 y.o.   MRN: [id number] D: Patient stated his day has been up and down but has stayed tamed with the radio. Pt stayed mostly  in his room but interacts well with his roommate. Pt endorses auditory hullucination without command. Pt denies suicidal /homicidal ideation intent and plan. Pt denies any needs or concerns. Cooperative with assessment. No acute distressed noted at this time.   A: Met with pt 1:1. Medications administered as prescribed. Writer encouraged pt to discuss feelings. Pt encouraged to come to staff with any questions or concerns.   R: Patient is safe on the unit. He is complaint with medications and denies any adverse reaction. Continue current POC.

## 2013-12-04 NOTE — [facility] Group Notes (Signed)
[facility]  12/04/2013  1:26 PM  Type of Therapy:  [facility] [employer] Presentation   Participation Level:  Minimal  Participation Quality:  Inattentive  Affect:  Flat  Cognitive:  Disorganized  Insight:  Distracting  Engagement in Therapy:  Distracting  Modes of Intervention:  Discussion, Education and Socialization   Summary of Progress/Problems:  [name] from [employer] came to present his recovery story and play the guitar.  [name] had a towel wrapped around his head and neck.  He seems distracted.  He gazed at the walls and played with his hair and fingers.  Kept on flicking his fingers near his ears.  Left in the last 20 minutes of group and did not return back.    [name]   12/04/2013  1:26 PM

## 2013-12-04 NOTE — Progress Notes (Signed)
[facility] MD Progress Note  12/04/2013 10:41 AM Vincent [name]  MRN:  [id number] Subjective: "I am still hearing voices and getting really agitated.'' Objective: Patient seen and chart reviewed. He continues to endorse auditory hallucination, hearing strange voices in his head, feeling paranoid, saying he  can't trust anyone. He has been getting easily agitated and irritable. According to the staff, he attempted to elope from the unit yesterday with his friends who paid him a visit. He denies suicidal thoughts, intent or plan. Patient thinks he smoked a bad "weed". 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 None reported    Axis IV: other psychosocial or environmental problems and problems related to social environment  ADL's:  Intact  Sleep: Fair  Appetite:  Fair  Suicidal Ideation: denies  Homicidal Ideation: denies  AEB (as evidenced by):  Psychiatric Specialty Exam: Physical Exam  Psychiatric: His mood appears anxious. His affect is angry and labile. His speech is rapid and/or pressured. He is aggressive. Thought content is paranoid. Cognition and memory are normal. He expresses impulsivity.    Review of Systems  Constitutional: Negative.   HENT: Negative.   Eyes: Negative.   Respiratory: Negative.   Cardiovascular: Negative.   Gastrointestinal: Negative.   Genitourinary: Negative.   Musculoskeletal: Negative.   Skin: Negative.   Neurological:  Negative.   Endo/Heme/Allergies: Negative.   Psychiatric/Behavioral: Positive for hallucinations and substance abuse. The patient is nervous/anxious.     Blood pressure 169/101, pulse 84, temperature 97.5 F (36.4 C), temperature source Oral, resp. rate 20, height 6\' 2"  (1.88 m), weight 73.029 kg (161 lb).Body mass index is 20.66 kg/(m^2).  General Appearance: Fairly Groomed  Patent attorney::  Minimal  Speech:  Clear and Coherent  Volume:  Increased  Mood:  Angry and Irritable  Affect:  Labile  Thought Process:  Disorganized  Orientation:  Full (Time, Place, and Person)  Thought Content:  Delusions and Hallucinations: Auditory  Suicidal Thoughts:  No  Homicidal Thoughts:  No  Memory:  Immediate;   Fair Recent;   Fair Remote;   Fair  Judgement:  Poor  Insight:  Lacking  Psychomotor Activity:  Increased  Concentration:  Fair  Recall:  [employer] of Knowledge:Fair  Language: Good  Akathisia:  No  Handed:  Right  AIMS (if indicated):     Assets:  Communication Skills Desire for Improvement Physical Health  Sleep:  Number of Hours: 6.75   Musculoskeletal: Strength & Muscle Tone: within normal limits Gait & Station: normal Patient leans: N/A  Current Medications: Current Facility-Administered Medications  Medication Dose Route Frequency Provider Last Rate Last Dose  . acetaminophen (TYLENOL) tablet 650 mg  650 mg Oral Q4H PRN Nanine Means, NP      . alum & mag hydroxide-simeth (MAALOX/MYLANTA) 200-200-20 MG/5ML suspension 30 mL  30 mL Oral PRN Nanine Means, NP      . ibuprofen (ADVIL,MOTRIN) tablet 600 mg  600 mg Oral Q8H PRN Nanine Means, NP      .  LORazepam (ATIVAN) tablet 1 mg  1 mg Oral Q8H PRN Nanine MeansJamison Lord, NP   1 mg at 12/03/13 1434  . magnesium hydroxide (MILK OF MAGNESIA) suspension 30 mL  30 mL Oral Daily PRN Nanine MeansJamison Lord, NP      . nicotine (NICODERM CQ - dosed in mg/24 hours) patch 21 mg  21 mg Transdermal Daily Nanine MeansJamison Lord, NP      . OLANZapine zydis (ZYPREXA)  disintegrating tablet 10 mg  10 mg Oral Q8H PRN Fransisca KaufmannLaura Davis, NP   10 mg at 12/03/13 1454  . ondansetron (ZOFRAN) tablet 4 mg  4 mg Oral Q8H PRN Nanine MeansJamison Lord, NP      . potassium chloride (K-DUR,KLOR-CON) CR tablet 10 mEq  10 mEq Oral Daily Nanine MeansJamison Lord, NP   10 mEq at 12/04/13 0820  . risperiDONE (RISPERDAL) tablet 2 mg  2 mg Oral QHS Dene Nazir      . traZODone (DESYREL) tablet 50 mg  50 mg Oral QHS PRN Ericberto Padget        Lab Results: No results found for this or any previous visit (from the past 48 hour(s)).  Physical Findings: AIMS: Facial and Oral Movements Muscles of Facial Expression: None, normal Lips and Perioral Area: None, normal Jaw: None, normal Tongue: None, normal,Extremity Movements Upper (arms, wrists, hands, fingers): None, normal Lower (legs, knees, ankles, toes): None, normal, Trunk Movements Neck, shoulders, hips: None, normal, Overall Severity Severity of abnormal movements (highest score from questions above): None, normal Incapacitation due to abnormal movements: None, normal Patient's awareness of abnormal movements (rate only patient's report): No Awareness, Dental Status Current problems with teeth and/or dentures?: No Does patient usually wear dentures?: No  CIWA:    COWS:     Treatment Plan Summary: Daily contact with patient to assess and evaluate symptoms and progress in treatment Medication management  Plan:1. Admit for crisis management and stabilization. 2. Medication management to reduce current symptoms to base line and improve the patient's overall level of functioning: -Increase Risperdal to 2mg  po Qhs for mood/delusions/psychosis. 3. Treat health problems as indicated. 4. Develop treatment plan to decrease risk of relapse upon discharge and the need for   readmission. 5. Psycho-social education regarding relapse prevention and self care. 6. Health care follow up as needed for medical problems. 7. Restart home medications where  appropriate.   Medical Decision Making Problem Points:  Established problem, worsening (2), Review of last therapy session (1) and Review of psycho-social stressors (1) Data Points:  Order Aims Assessment (2) Review or order clinical lab tests (1) Review of medication regiment & side effects (2) Review of new medications or change in dosage (2)  I certify that inpatient services furnished can reasonably be expected to improve the patient's condition.   Thedore MinsAkintayo, [place] Mokry, MD 12/04/2013, 10:41 AM

## 2013-12-04 NOTE — ED Provider Notes (Signed)
Medical screening examination/treatment/procedure(s) were performed by non-physician practitioner and as supervising physician I was immediately available for consultation/collaboration.   EKG Interpretation None        David H Yao, MD 12/04/13 1624 

## 2013-12-04 NOTE — [facility] Group Notes (Signed)
Adult Psychoeducational Group Note  Date:  12/04/2013 Time:  1:41 PM  Group Topic/Focus:  Managing Feelings:   The focus of this group is to identify what feelings patients have difficulty handling and develop a plan to handle them in a healthier way upon discharge.  Participation Level:  Did Not Attend   Stephani PoliceBlackmon, Kayliah Tindol P 12/04/2013, 1:41 PM

## 2013-12-04 NOTE — [facility] Group Notes (Signed)
[facility]   12/04/2013 11:40 AM  Participation Quality:  Did not attend    Cook [name], Vincent B

## 2013-12-04 NOTE — Progress Notes (Signed)
NUTRITION ASSESSMENT  Pt identified as at risk on the Malnutrition Screen Tool  INTERVENTION: 1. Educated patient on the importance of nutrition and encouraged intake of food and beverages. 2. Discussed weight goals. 3. Supplements: none at this time.  NUTRITION DIAGNOSIS: Unintentional weight loss related to sub-optimal intake as evidenced by pt report.   Goal: Pt to meet >/= 90% of their estimated nutrition needs.  Monitor:  PO intake  Assessment:  Patient admitted with psychosis.  Per RN, patient is eating well but not appropriate to see based on paranoia. Thin but weight WNL based on BMI.  18 y.o. male  Height: Ht Readings from Last 1 Encounters:  12/02/13 6\' 2"  (1.88 m) (94%*, Z = 1.60)   * Growth percentiles are based on CDC 2-20 Years data.    Weight: Wt Readings from Last 1 Encounters:  12/02/13 161 lb (73.029 kg) (62%*, Z = 0.29)   * Growth percentiles are based on CDC 2-20 Years data.    Weight Hx: Wt Readings from Last 10 Encounters:  12/02/13 161 lb (73.029 kg) (62%*, Z = 0.29)  12/01/13 180 lb (81.647 kg) (82%*, Z = 0.92)   * Growth percentiles are based on CDC 2-20 Years data.    BMI:  Body mass index is 20.66 kg/(m^2). Pt meets criteria for normal weight based on current BMI.  Estimated Nutritional Needs: Kcal: 25-30 kcal/kg Protein: > 1 gram protein/kg Fluid: 1 ml/kcal  Diet Order: General Pt is also offered choice of unit snacks mid-morning and mid-afternoon.  Pt is eating as desired.   Lab results and medications reviewed.   Oran ReinLaura Duante Arocho, RD, LDN Clinical Inpatient Dietitian Pager:  [phone] Weekend and after hours pager:  [phone]

## 2013-12-05 LAB — BASIC METABOLIC PANEL
BUN: 14 mg/dL (ref 6–23)
CALCIUM: 9.6 mg/dL (ref 8.4–10.5)
CO2: 29 mEq/L (ref 19–32)
Chloride: 100 mEq/L (ref 96–112)
Creatinine, Ser: 1.27 mg/dL (ref 0.50–1.35)
GFR calc Af Amer: >90 mL/min (ref >90)
GFR, EST NON AFRICAN AMERICAN: 81 mL/min — AB (ref >90)
GLUCOSE: 89 mg/dL (ref 70–99)
POTASSIUM: 3.9 meq/L (ref 3.7–5.3)
Sodium: 140 mEq/L (ref 137–147)

## 2013-12-05 NOTE — [facility] Suicide Risk Assessment (Signed)
[facility] INPATIENT:  Family/Significant Other Suicide Prevention Education  Suicide Prevention Education:  Education Completed; No one has been identified by the patient as the family member/significant other with whom the patient will be residing, and identified as the person(s) who will aid the patient in the event of a mental health crisis (suicidal ideations/suicide attempt).  With written consent from the patient, the family member/significant other has been provided the following suicide prevention education, prior to the and/or following the discharge of the patient.  The suicide prevention education provided includes the following:  Suicide risk factors  Suicide prevention and interventions  National Suicide Hotline telephone number  [facility] assessment telephone number  [facility] Emergency Assistance 911  [facility] and/or Residential Mobile Crisis Unit telephone number  Request made of family/significant other to:  Remove weapons (e.g., guns, rifles, knives), all items previously/currently identified as safety concern.    Remove drugs/medications (over-the-counter, prescriptions, illicit drugs), all items previously/currently identified as a safety concern.  The family member/significant other verbalizes understanding of the suicide prevention education information provided.  The family member/significant other agrees to remove the items of safety concern listed above. The patient did not endorse SI at the time of admission, nor did the patient c/o SI during the stay here.  SPE not required.   [name] Geraldorth, Vincent B 12/05/2013, 3:22 PM

## 2013-12-05 NOTE — Progress Notes (Signed)
Patient ID: Vincent [name], male   DOB: 12-Jan-1996, 18 y.o.   MRN: [id number] D: Patient stated his day was good. Pt was observed tapping his chest and when asked why he said that was a way to keep his anxiety down. Pt has been out of his room tonight engaging with peers in the dayroom. Pt endorses auditory hullucination without command. Pt denies suicidal /homicidal ideation intent and plan. Pt denies any needs or concerns. Cooperative with assessment. No acute distressed noted at this time.   A: Met with pt 1:1. Medications administered as prescribed. Writer encouraged pt to discuss feelings. Pt encouraged to come to staff with any questions or concerns.   R: Patient is safe on the unit. He is complaint with medications and denies any adverse reaction. Continue current POC.

## 2013-12-05 NOTE — [facility] Group Notes (Signed)
[facility] Group Notes:  (Counselor/Nursing/MHT/Case Management/Adjunct)  12/05/2013 1:15PM  Type of Therapy:  Group Therapy  Participation Level:  Did not attend   Summary of Progress/Problems: The topic for group was balance in life.  Pt participated in the discussion about when their life was in balance and out of balance and how this feels.  Pt discussed ways to get back in balance and short term goals they can work on to get where they want to be.    Daryel Geraldorth, Jese B 12/05/2013 1:31 PM

## 2013-12-05 NOTE — Tx Team (Signed)
  Interdisciplinary Treatment Plan Update   Date Reviewed:  12/05/2013  Time Reviewed:  3:25 PM  Progress in Treatment:   Attending groups: Yes Participating in groups: Yes Taking medication as prescribed: Yes  Tolerating medication: Yes Family/Significant other contact made: Yes  Patient understands diagnosis: Yes  Discussing patient identified problems/goals with staff: Yes Medical problems stabilized or resolved: Yes Denies suicidal/homicidal ideation: Yes Patient has not harmed self or others: Yes  For review of initial/current patient goals, please see plan of care.  Estimated Length of Stay:  Likely d/c tomorrow  Reason for Continuation of Hospitalization:   New Problems/Goals identified:  N/A  Discharge Plan or Barriers:   return home, follow up outpt  Additional Comments:  Attendees:  Signature: Thedore MinsMojeed Akintayo, MD 12/05/2013 3:25 PM   Signature: Richelle Itood Latasia Silberstein, LCSW 12/05/2013 3:25 PM  Signature: Fransisca KaufmannLaura Davis, NP 12/05/2013 3:25 PM  Signature: Joslyn Devonaroline Beaudry, RN 12/05/2013 3:25 PM  Signature: Liborio NixonPatrice White, RN 12/05/2013 3:25 PM  Signature:  12/05/2013 3:25 PM  Signature:   12/05/2013 3:25 PM  Signature:    Signature:    Signature:    Signature:    Signature:    Signature:      Scribe for Treatment Team:   Richelle Itood Latresa Gasser, LCSW  12/05/2013 3:25 PM

## 2013-12-05 NOTE — Progress Notes (Signed)
D:  Per pt self inventory pt reports sleeping fair, appetite improving, energy level normal, ability to pay attention good, rates depression at a 5 out of 10 and hopelessness at a 7 out of 10, denies SI/HI/AVH, calm and cooperative, flat/irritable during interaction, pt wants to be discharged, signed a 72 hour request for discharge on 3/17, pt doesn't appear to be responding to any internal stimuli at this time.      A:  Emotional support provided, Encouraged pt to continue with treatment plan and attend all group activities, q15 min checks maintained for safety.  R:  Pt is calm and cooperative, not going to groups, isolating in his room, during conversation with Clinical research associatewriter, pt showed good insight stating that being here has given him time to think about his life, states that he wants to get back into school and start working again, get his life back on track, states that he has a very good relationship with his mother and that he is able to talk with her about anything going on in his life, mother called and was concerned about the patient being discharged tomorrow because she said that on 3/17 the patient said that he "wouldn't be living much longer" and seemed to be fidgety and not acting himself, pt was not fidgety during interaction with Clinical research associatewriter today and was able to carry on a logical coherent conversation with the writer, pt was insistent that he did not feel as though he needed to be here and was hoping to leave today.

## 2013-12-05 NOTE — Progress Notes (Signed)
Patient ID: Vincent [name], male   DOB: 1995/12/19, 18 y.o.   MRN: [id number] [facility] MD Progress Note  12/05/2013 10:41 AM Vincent [name]  MRN:  [id number] Subjective: Patient states "I still feel restless. I am not hearing voices as much. Listening to the radio has helped me chill out."   Objective:  Patient reports decreased symptoms of auditory hallucinations, anxiety, and agitation. The patient has been making slow but steady progress. [name] has been listening to the radio to help him calm down. He has continued to have episodes of agitation according to nursing staff. Yesterday patient was expressing delusional in the hallway as he was speaking about coming to this hospital when he broke his jaw as a little kid. Patient stayed back from recreation due to concern for elopement. Vincent [name] has been observed talking to himself on the unit. He appears to minimize his symptoms to Provider. He continues to feel that the marijuana he was smoking could be contributing to his symptoms. Patient is complaint with his scheduled medications.   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 None reported    Axis IV: other psychosocial or environmental problems and problems related to social environment  ADL's:  Intact  Sleep: Fair  Appetite:  Fair  Suicidal Ideation: denies  Homicidal Ideation: denies  AEB (as evidenced by):  Psychiatric Specialty Exam: Physical  Exam  Psychiatric: His speech is normal. His mood appears anxious. His affect is labile. Thought content is paranoid. Cognition and memory are normal. He expresses impulsivity. He is inattentive.    Review of Systems  Constitutional: Negative.   HENT: Negative.   Eyes: Negative.   Respiratory: Negative.   Cardiovascular: Negative.   Gastrointestinal: Negative.   Genitourinary: Negative.   Musculoskeletal: Negative.   Skin: Negative.   Neurological: Negative.   Endo/Heme/Allergies: Negative.   Psychiatric/Behavioral: Positive for hallucinations and substance abuse. Negative for depression, suicidal ideas and memory loss. The patient is nervous/anxious. The patient does not have insomnia.     Blood pressure 117/80, pulse 130, temperature 98.4 F (36.9 C), temperature source Oral, resp. rate 20, height _0  (1.88 m), weight 73.029 kg (161 lb).Body mass index is 20.66 kg/(m^2).  General Appearance: Fairly Groomed  Engineer, water::  Fair  Speech:  Clear and Coherent  Volume:  Normal  Mood:  Irritable  Affect:  Labile  Thought Process:  Coherent  Orientation:  Full (Time, Place, and Person)  Thought Content:  Delusions and Hallucinations: Auditory  Suicidal Thoughts:  No  Homicidal Thoughts:  No  Memory:  Immediate;   Fair Recent;   Fair Remote;   Fair  Judgement:  Poor  Insight:  Lacking  Psychomotor Activity:  Increased  Concentration:  Fair  Recall:  [employer] of Knowledge:Fair  Language: Good  Akathisia:  No  Handed:  Right  AIMS (if indicated):     Assets:  Communication Skills Desire for Improvement Physical Health  Sleep:  Number of  Hours: 5.5   Musculoskeletal: Strength & Muscle Tone: within normal limits Gait & Station: normal Patient leans: N/A  Current Medications: Current Facility-Administered Medications  Medication Dose Route Frequency Provider Last Rate Last Dose  . acetaminophen (TYLENOL) tablet 650 mg  650 mg Oral Q4H PRN Waylan Boga, NP      . alum  & mag hydroxide-simeth (MAALOX/MYLANTA) 200-200-20 MG/5ML suspension 30 mL  30 mL Oral PRN Waylan Boga, NP      . cloNIDine (CATAPRES) tablet 0.1 mg  0.1 mg Oral Q8H PRN Elmarie Shiley, NP      . ibuprofen (ADVIL,MOTRIN) tablet 600 mg  600 mg Oral Q8H PRN Waylan Boga, NP      . LORazepam (ATIVAN) tablet 1 mg  1 mg Oral Q8H PRN Waylan Boga, NP   1 mg at 12/05/13 0758  . magnesium hydroxide (MILK OF MAGNESIA) suspension 30 mL  30 mL Oral Daily PRN Waylan Boga, NP      . OLANZapine zydis (ZYPREXA) disintegrating tablet 10 mg  10 mg Oral Q8H PRN Elmarie Shiley, NP   10 mg at 12/04/13 1325  . ondansetron (ZOFRAN) tablet 4 mg  4 mg Oral Q8H PRN Waylan Boga, NP      . potassium chloride (K-DUR,KLOR-CON) CR tablet 10 mEq  10 mEq Oral Daily Waylan Boga, NP   10 mEq at 12/05/13 0757  . risperiDONE (RISPERDAL) tablet 2 mg  2 mg Oral QHS Berdine Rasmusson   2 mg at 12/04/13 2133  . traZODone (DESYREL) tablet 50 mg  50 mg Oral QHS PRN Gurjot Brisco   50 mg at 12/04/13 2134    Lab Results:  Results for orders placed during the hospital encounter of 12/02/13 (from the past 48 hour(s))  BASIC METABOLIC PANEL     Status: Abnormal   Collection Time    12/05/13  6:10 AM      Result Value Ref Range   Sodium 140  137 - 147 mEq/L   Potassium 3.9  3.7 - 5.3 mEq/L   Chloride 100  96 - 112 mEq/L   CO2 29  19 - 32 mEq/L   Glucose, Bld 89  70 - 99 mg/dL   BUN 14  6 - 23 mg/dL   Creatinine, Ser 1.27  0.50 - 1.35 mg/dL   Calcium 9.6  8.4 - 10.5 mg/dL   GFR calc non Af Amer 81 (*) >90 mL/min   GFR calc Af Amer >90  >90 mL/min   Comment: (NOTE)     The eGFR has been calculated using the CKD EPI equation.     This calculation has not been validated in all clinical situations.     eGFR's persistently <90 mL/min signify possible Chronic Kidney     Disease.     Performed at [facility]    Physical Findings: AIMS: Facial and Oral Movements Muscles of Facial Expression: None, normal Lips and  Perioral Area: None, normal Jaw: None, normal Tongue: None, normal,Extremity Movements Upper (arms, wrists, hands, fingers): None, normal Lower (legs, knees, ankles, toes): None, normal, Trunk Movements Neck, shoulders, hips: None, normal, Overall Severity Severity of abnormal movements (highest score from questions above): None, normal Incapacitation due to abnormal movements: None, normal Patient's awareness of abnormal movements (rate only patient's report): No Awareness, Dental Status Current problems with teeth and/or dentures?: No Does patient usually wear dentures?: No  CIWA:    COWS:     Treatment Plan Summary: Daily contact with patient to assess and  evaluate symptoms and progress in treatment Medication management  Plan:1. Continue crisis management and stabilization. 2. Medication management to reduce current symptoms to base line and improve the patient's overall level of functioning: -Continue Risperdal to 67m po Qhs for mood/delusions/psychosis. 3. Treat health problems as indicated. 4. Develop treatment plan to decrease risk of relapse upon discharge and the need for readmission. 5. Psycho-social education regarding relapse prevention and self care. 6. Health care follow up as needed for medical problems. Follow of hypokalemia on chemistry panel WNL of 3.9. Monitoring elevated blood pressures. Continue clonidine 0.1 mg every eight hours prn blood pressure greater than 140/90.  7. Restart home medications where appropriate. 8. Anticipate discharge home tomorrow.   Medical Decision Making Problem Points:  Established problem, stable/improving (2), Review of last therapy session (1) and Review of psycho-social stressors (1) Data Points:  Order Aims Assessment (2) Review or order clinical lab tests (1) Review of medication regiment & side effects (2) Review of new medications or change in dosage (2)  I certify that inpatient services furnished can reasonably be expected  to improve the patient's condition.   DElmarie Shiley NP-C 12/05/2013, 10:41 AM Patient seen, evaluated and I agree with notes by Nurse Practitioner. MCorena Pilgrim MD

## 2013-12-05 NOTE — [facility] Group Notes (Signed)
Adult Psychoeducational Group Note  Date:  12/05/2013 Time:  0900am  Group Topic/Focus:  Orientation:   The focus of this group is to educate the patient on the purpose and policies of crisis stabilization and provide a format to answer questions about their admission.  The group details unit policies and expectations of patients while admitted.  Participation Level:  Did Not Attend  Alfonse Sprucehorne, Elyshia Kumagai Brooke 12/05/2013, 12:55 PM

## 2013-12-06 DIAGNOSIS — F39 Unspecified mood [affective] disorder: Secondary | ICD-10-CM | POA: Diagnosis present

## 2013-12-06 MED ORDER — RISPERIDONE 2 MG PO TABS: 2.0000 mg | ORAL_TABLET | Freq: Every day | ORAL | Status: DC

## 2013-12-06 MED ORDER — TRAZODONE HCL 50 MG PO TABS: 50.0000 mg | ORAL_TABLET | Freq: Every evening | ORAL | Status: DC | PRN

## 2013-12-06 NOTE — Progress Notes (Signed)
Patient ID: [name], male   DOB: 01/06/1996, 18 y.o.   MRN: [id number] Patient denies si/hi/avh. Pt verbalizes understanding of discharge instructions, prescriptions and follow up appts. Pt had no belongings in a locker and got his sample medications. Pt received letters to give to his employer and to his school. Pt was walked to the lobby and was transported by family (mother).

## 2013-12-06 NOTE — [facility] Suicide Risk Assessment (Addendum)
   Demographic Factors:  Male, Adolescent or young adult and college student  Total Time spent with patient: 20 minutes  Psychiatric Specialty Exam: Physical Exam  Psychiatric: He has a normal mood and affect. His speech is normal and behavior is normal. Thought content normal. Cognition and memory are normal. He expresses impulsivity.    Review of Systems  Constitutional: Negative.   HENT: Negative.   Eyes: Negative.   Respiratory: Negative.   Cardiovascular: Negative.   Gastrointestinal: Negative.   Genitourinary: Negative.   Musculoskeletal: Negative.   Skin: Negative.   Neurological: Negative.   Endo/Heme/Allergies: Negative.   Psychiatric/Behavioral: Negative.     Blood pressure 126/85, pulse 108, temperature 97.7 F (36.5 C), temperature source Oral, resp. rate 16, height 6\' 2"  (1.88 m), weight 73.029 kg (161 lb).Body mass index is 20.66 kg/(m^2).  General Appearance: Fairly Groomed  Patent attorneyye Contact::  Fair  Speech:  Clear and Coherent and Normal Rate  Volume:  Normal  Mood:  Euthymic  Affect:  Appropriate and Congruent  Thought Process:  Negative  Orientation:  Full (Time, Place, and Person)  Thought Content:  Negative  Suicidal Thoughts:  No  Homicidal Thoughts:  No  Memory:  Immediate;   Fair Recent;   Fair Remote;   Fair  Judgement:  Fair  Insight:  Fair  Psychomotor Activity:  Normal  Concentration:  Fair  Recall:  [employer]Fair  Fund of Knowledge:Fair  Language: Good  Akathisia:  No  Handed:  Right  AIMS (if indicated):     Assets:  Communication Skills Desire for Improvement Physical Health  Sleep:  Number of Hours: 6.75    Musculoskeletal: Strength & Muscle Tone: within normal limits Gait & Station: normal Patient leans: N/A   Mental Status Per Nursing Assessment::   On Admission:     Current Mental Status by Physician: patient denies suicide ideation, intent or plan  Loss Factors: Financial problems/change in socioeconomic status  Historical  Factors: Impulsivity  Risk Reduction Factors:   Sense of responsibility to family and Positive social support  Continued Clinical Symptoms:  Alcohol/Substance Abuse/Dependencies  Cognitive Features That Contribute To Risk:  Closed-mindedness    Suicide Risk:  Minimal: No identifiable suicidal ideation.  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 None reported    AXIS IV:  other psychosocial or environmental problems and problems related to social environment AXIS V:  61-70 mild symptoms  Plan Of Care/Follow-up recommendations:  Activity:  as tolerated Diet:  healthy Tests:  routine Other:  patient to keep his after care appointment  Is patient on multiple antipsychotic therapies at discharge:  No   Has Patient had three or more failed trials of antipsychotic monotherapy by history:  No  Recommended Plan for Multiple Antipsychotic Therapies: NA    Thedore MinsAkintayo, Tanai Bouler, MD 12/06/2013, 9:40 AM

## 2013-12-06 NOTE — Discharge Summary (Signed)
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 His speech is normal. Thought content normal. Cognition and memory are normal. He expresses impulsivity.    Review of Systems  Constitutional: Negative.   HENT: Negative.   Eyes: Negative.   Respiratory: Negative.   Cardiovascular: Negative.   Gastrointestinal: Negative.   Genitourinary: Negative.   Musculoskeletal: Negative.   Skin: Negative.   Neurological: Negative.   Endo/Heme/Allergies: Negative.   Psychiatric/Behavioral: Negative for depression, suicidal ideas, hallucinations, memory loss and substance abuse. The patient is not nervous/anxious and does not have insomnia.     Blood pressure 126/85, pulse 108, temperature 98.4 F (36.9 C), temperature source Oral, resp. rate 16, height $RemoveBe'6\' 2"'uPmICWwxj$  (1.88 m), weight 73.029 kg (161 lb).Body mass index is 20.66 kg/(m^2).  General Appearance: Fairly Groomed  Engineer, water::  Fair  Speech:  Clear and Coherent and Normal Rate  Volume:  Normal  Mood:  Euthymic  Affect:  Appropriate and Congruent  Thought Process:  Negative  Orientation:  Full (Time, Place, and Person)  Thought Content:  Negative  Suicidal Thoughts:  No  Homicidal Thoughts:  No  Memory:  Immediate;   Fair Recent;   Fair Remote;   Fair  Judgement:  Fair  Insight:  Fair  Psychomotor Activity:  Normal  Concentration:   Fair  Recall:  [employer] of Knowledge:Fair  Language: Good  Akathisia:  No  Handed:  Right  AIMS (if indicated):     Assets:  Communication Skills Desire for Improvement Physical Health  Sleep:  Number of Hours: 6.75    Past Psychiatric History: Diagnosis: Denies  Hospitalizations: Denies  Outpatient Care: Denies  Substance Abuse Care: Denies  Self-Mutilation: Denies  Suicidal Attempts: Denies  Violent Behaviors: Potential    Musculoskeletal: Strength & Muscle Tone: within normal limits Gait & Station: normal Patient leans: N/A  DSM5:  AXIS I: 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  None reported    AXIS IV: other psychosocial or environmental problems and problems related to social environment  AXIS V: 61-70 mild symptoms   Level of Care:  OP  Hospital Course:  Vincent [name] is a 18 year old who presented voluntarily to [facility] because of racing thoughts of a religious nature. The patient reported hearing voices and seeing angel wings in the sky. Vincent [name] denies of having a psychiatric history or any similar episodes in the past. Patient states today during his psychiatric assessment "I think on Sunday I lost control of myself. My mind went where my eyes followed. I was having strange movements. I don't feel like I trust anyone. I have overwhelming anxiety. I am over-thinking success and failure. I feel something great or beautiful will happen. I hear voices that I have  never heard before." The patient was very tearful during his psychiatric assessment. He appeared to be responding to internal stimuli. The patient became agitated later punching the walls and holding his head in his hands. Nursing staff report that the patient seems extremely distressed by his psychosis. Vincent [name] was given ativan and zyprexa zydis orally to  calm him down. The patient is also presenting as very paranoid on the unit and mistrustful of others. The patient's thoughts appeared very disorganized at times during the interview and he had a hard time expressing himself. His mother reported after she visited last night that the patient mentioned harming his roommate. Vincent [name] has not been aggressive to anyone so far in his admission.   Patient was admitted to the 400 hall for further assessment and medication management. The patient was not taking any psychiatric medication prior to admission. The only medication on his prior to admission list was Nyquil, which patient was abusing to get high. Patient admitted to heavy marijuana use for a long time. Vincent [name] was started on Risperdal 2 mg at hs for psychosis, Ativan 1 mg every eight hours prn anxiety, and Zyprexa Zydis 10 mg every eight hours prn agitation. Patient continued to report hearing voices and feelings of agitated. On one occasion his friends came to visit who tried to encourage the patient to leave the hospital. The visitors were not allowed to come back to visit. Patient admitted that it was possible that the marijuana could have been laced. Vincent [name] reported that he had not had similar symptoms prior to this admission. Patient was compliant with his medications and reported that he would take them after discharge. His mother was supportive calling each day to check in on him. Vincent [name] was observed talking to himself in the hall and expressed some delusional thoughts. Patient appeared to be responding to internal stimuli. His Risperdal at that time was increased to 2 mg at hs to address his psychosis. He received ativan 1 mg every eight hours prn for symptoms of anxiety and agitation. Patient's psychiatric symptoms slowly began to stabilize. Patient will follow up with [facility]. Patient was provided with prescriptions and sample medications. Vincent [name] left [facility] in no acute distress with all belongings returned.    Consults:  psychiatry  Significant Diagnostic Studies:    Discharge Vitals:   Blood pressure 126/85, pulse 108, temperature 98.4 F (36.9 C), temperature source Oral, resp. rate 16, height $RemoveBe'6\' 2"'vOoOpGgFv$  (1.88 m), weight 73.029 kg (161 lb). Body mass index is 20.66 kg/(m^2). Lab Results:   Results for orders placed during the hospital encounter of 12/02/13 (from the past 72 hour(s))  BASIC METABOLIC PANEL     Status: Abnormal   Collection Time    12/05/13  6:10 AM      Result Value Ref Range   Sodium 140  137 - 147 mEq/L   Potassium 3.9  3.7 - 5.3 mEq/L   Chloride 100  96 - 112 mEq/L   CO2 29  19 - 32 mEq/L   Glucose, Bld 89  70 - 99 mg/dL   BUN 14  6 - 23 mg/dL   Creatinine, Ser 1.27  0.50 - 1.35 mg/dL   Calcium 9.6  8.4 - 10.5 mg/dL   GFR calc non Af Amer 81 (*) >90 mL/min   GFR calc Af Amer >90  >90 mL/min   Comment: (NOTE)     The eGFR has been calculated using the CKD EPI equation.     This calculation has not been  validated in all clinical situations.     eGFR's persistently <90 mL/min signify possible Chronic Kidney     Disease.     Performed at [facility]    Physical Findings: AIMS: Facial and Oral Movements Muscles of Facial Expression: None, normal Lips and Perioral Area: None, normal Jaw: None, normal Tongue: None, normal,Extremity Movements Upper (arms, wrists, hands, fingers): None, normal Lower (legs, knees, ankles, toes): None, normal, Trunk Movements Neck, shoulders, hips: None, normal, Overall Severity Severity of abnormal movements (highest score from questions above): None, normal Incapacitation due to abnormal movements: None, normal Patient's awareness of abnormal movements (rate only patient's report): No Awareness, Dental Status Current problems with teeth and/or dentures?: No Does patient usually wear dentures?: No  CIWA:    COWS:     Psychiatric Specialty Exam: See Psychiatric Specialty Exam and Suicide Risk Assessment  completed by Attending Physician prior to discharge.  Discharge destination:  Home  Is patient on multiple antipsychotic therapies at discharge:  No   Has Patient had three or more failed trials of antipsychotic monotherapy by history:  No  Recommended Plan for Multiple Antipsychotic Therapies: NA     Medication List    STOP taking these medications       NYQUIL PO      TAKE these medications     Indication   risperiDONE 2 MG tablet  Commonly known as:  RISPERDAL  Take 1 tablet (2 mg total) by mouth at bedtime.   Indication:  psychosis and delusion     traZODone 50 MG tablet  Commonly known as:  DESYREL  Take 1 tablet (50 mg total) by mouth at bedtime as needed for sleep.   Indication:  Trouble Sleeping       Follow-up Information   Follow up with [facility] . (Go to walk-in clinic M-F between 8 and 9am for your hospital follow-up appointment.  )    Contact information:   [address] [phone]      Follow-up recommendations:   Activity: as tolerated  Diet: healthy  Tests: routine  Other: patient to keep his after care appointment   Comments:  Take all your medications as prescribed by your mental healthcare provider.  Report any adverse effects and or reactions from your medicines to your outpatient provider promptly.  Patient is instructed and cautioned to not engage in alcohol and or illegal drug use while on prescription medicines.  In the event of worsening symptoms, patient is instructed to call the crisis hotline, 911 and or go to the nearest ED for appropriate evaluation and treatment of symptoms.  Follow-up with your primary care provider for your other medical issues, concerns and or health care needs.   Total Discharge Time:  Greater than 30 minutes.  SignedElmarie Shiley NP-C 12/06/2013, 11:56 AM  Patient seen, evaluated and I agree with notes by Nurse Practitioner. Corena Pilgrim, MD

## 2013-12-11 NOTE — Progress Notes (Signed)
Patient Discharge Instructions:  After Visit Summary (AVS):   Faxed to:  12/11/13 Discharge Summary Note:   Faxed to:  12/11/13 Psychiatric Admission Assessment Note:   Faxed to:  12/11/13 Suicide Risk Assessment - Discharge Assessment:   Faxed to:  12/11/13 Faxed/Sent to the Next Level Care provider:  12/11/13 [facility] @ [phone]  Jerelene ReddenSheena E [place], 12/11/2013, 3:53 PM

## 2014-07-24 ENCOUNTER — Inpatient Hospital Stay (HOSPITAL_COMMUNITY)
Admission: AD | Admit: 2014-07-24 | Discharge: 2014-07-29 | DRG: 885 | Disposition: A | Discharge status: Home / Self Care | Payer: BC Managed Care – PPO | Source: Intra-hospital | Attending: Psychiatry | Admitting: Psychiatry

## 2014-07-24 ENCOUNTER — Encounter (HOSPITAL_COMMUNITY): Payer: Self-pay | Admitting: General Practice

## 2014-07-24 ENCOUNTER — Emergency Department (HOSPITAL_COMMUNITY)
Admission: EM | Admit: 2014-07-24 | Discharge: 2014-07-24 | Disposition: A | Discharge status: Other Acute Inpatient Hospital | Payer: BC Managed Care – PPO | Attending: Emergency Medicine | Admitting: Emergency Medicine

## 2014-07-24 DIAGNOSIS — Z559 Problems related to education and literacy, unspecified: Secondary | ICD-10-CM | POA: Diagnosis present

## 2014-07-24 DIAGNOSIS — F19929 Other psychoactive substance use, unspecified with intoxication, unspecified: Secondary | ICD-10-CM | POA: Insufficient documentation

## 2014-07-24 DIAGNOSIS — F419 Anxiety disorder, unspecified: Secondary | ICD-10-CM | POA: Insufficient documentation

## 2014-07-24 DIAGNOSIS — F17210 Nicotine dependence, cigarettes, uncomplicated: Secondary | ICD-10-CM | POA: Diagnosis present

## 2014-07-24 DIAGNOSIS — F1220 Cannabis dependence, uncomplicated: Secondary | ICD-10-CM | POA: Insufficient documentation

## 2014-07-24 DIAGNOSIS — Z9114 Patient's other noncompliance with medication regimen: Secondary | ICD-10-CM | POA: Diagnosis present

## 2014-07-24 DIAGNOSIS — Z599 Problem related to housing and economic circumstances, unspecified: Secondary | ICD-10-CM | POA: Diagnosis not present

## 2014-07-24 DIAGNOSIS — F200 Paranoid schizophrenia: Principal | ICD-10-CM | POA: Diagnosis present

## 2014-07-24 DIAGNOSIS — G4710 Hypersomnia, unspecified: Secondary | ICD-10-CM | POA: Diagnosis present

## 2014-07-24 DIAGNOSIS — F1994 Other psychoactive substance use, unspecified with psychoactive substance-induced mood disorder: Secondary | ICD-10-CM | POA: Insufficient documentation

## 2014-07-24 DIAGNOSIS — F19959 Other psychoactive substance use, unspecified with psychoactive substance-induced psychotic disorder, unspecified: Secondary | ICD-10-CM

## 2014-07-24 DIAGNOSIS — F1210 Cannabis abuse, uncomplicated: Secondary | ICD-10-CM | POA: Diagnosis present

## 2014-07-24 DIAGNOSIS — G4700 Insomnia, unspecified: Secondary | ICD-10-CM | POA: Diagnosis present

## 2014-07-24 LAB — COMPREHENSIVE METABOLIC PANEL
ALK PHOS: 76 U/L (ref 39–117)
ALT: 27 U/L (ref 0–53)
AST: 34 U/L (ref 0–37)
Albumin: 4.4 g/dL (ref 3.5–5.2)
Anion gap: 11 (ref 5–15)
BUN: 16 mg/dL (ref 6–23)
CALCIUM: 9.8 mg/dL (ref 8.4–10.5)
CHLORIDE: 101 meq/L (ref 96–112)
CO2: 27 meq/L (ref 19–32)
Creatinine, Ser: 0.97 mg/dL (ref 0.50–1.35)
GFR calc Af Amer: >90 mL/min (ref >90)
Glucose, Bld: 104 mg/dL — ABNORMAL HIGH (ref 70–99)
Potassium: 3.7 mEq/L (ref 3.7–5.3)
SODIUM: 139 meq/L (ref 137–147)
Total Bilirubin: 0.6 mg/dL (ref 0.3–1.2)
Total Protein: 7.5 g/dL (ref 6.0–8.3)

## 2014-07-24 LAB — CBC
HCT: 44.4 % (ref 39.0–52.0)
Hemoglobin: 15.4 g/dL (ref 13.0–17.0)
MCH: 29.6 pg (ref 26.0–34.0)
MCHC: 34.7 g/dL (ref 30.0–36.0)
MCV: 85.4 fL (ref 78.0–100.0)
Platelets: 245 10*3/uL (ref 150–400)
RBC: 5.2 MIL/uL (ref 4.22–5.81)
RDW: 12.7 % (ref 11.5–15.5)
WBC: 9.1 10*3/uL (ref 4.0–10.5)

## 2014-07-24 LAB — ETHANOL: Alcohol, Ethyl (B): <11 mg/dL (ref 0–11)

## 2014-07-24 LAB — RAPID URINE DRUG SCREEN, HOSP PERFORMED
AMPHETAMINES: NOT DETECTED
Barbiturates: NOT DETECTED
Benzodiazepines: NOT DETECTED
Cocaine: NOT DETECTED
OPIATES: NOT DETECTED
TETRAHYDROCANNABINOL: POSITIVE — AB

## 2014-07-24 LAB — ACETAMINOPHEN LEVEL: Acetaminophen (Tylenol), Serum: <15 ug/mL (ref 10–30)

## 2014-07-24 LAB — SALICYLATE LEVEL

## 2014-07-24 MED ORDER — TRAZODONE HCL 50 MG PO TABS: 50.0000 mg | ORAL_TABLET | Freq: Every evening | ORAL | Status: DC | PRN

## 2014-07-24 MED ORDER — NICOTINE 14 MG/24HR TD PT24
14.0000 mg | MEDICATED_PATCH | Freq: Every day | TRANSDERMAL | Status: DC
  Administered 2014-07-25 – 2014-07-27 (×2): 14 mg via TRANSDERMAL
  Filled 2014-07-24 (×9): qty 1

## 2014-07-24 MED ORDER — MAGNESIUM HYDROXIDE 400 MG/5ML PO SUSP: 30.0000 mL | Freq: Every day | ORAL | Status: DC | PRN

## 2014-07-24 MED ORDER — RISPERIDONE 2 MG PO TABS
2.0000 mg | ORAL_TABLET | Freq: Every day | ORAL | Status: DC
  Administered 2014-07-24 – 2014-07-27 (×4): 2 mg via ORAL
  Filled 2014-07-24 (×7): qty 1

## 2014-07-24 MED ORDER — TRAZODONE HCL 50 MG PO TABS
50.0000 mg | ORAL_TABLET | Freq: Every evening | ORAL | Status: DC | PRN
  Administered 2014-07-26 – 2014-07-27 (×2): 50 mg via ORAL
  Filled 2014-07-24: qty 1
  Filled 2014-07-24: qty 3
  Filled 2014-07-24 (×3): qty 1

## 2014-07-24 MED ORDER — ACETAMINOPHEN 325 MG PO TABS: 650.0000 mg | ORAL_TABLET | Freq: Four times a day (QID) | ORAL | Status: DC | PRN

## 2014-07-24 MED ORDER — RISPERIDONE 2 MG PO TABS: 2.0000 mg | ORAL_TABLET | Freq: Every day | ORAL | Status: DC

## 2014-07-24 MED ORDER — ALUM & MAG HYDROXIDE-SIMETH 200-200-20 MG/5ML PO SUSP: 30.0000 mL | ORAL | Status: DC | PRN

## 2014-07-24 NOTE — ED Notes (Signed)
Bed: WA22 Expected date:  Expected time:  Means of arrival:  Comments: 

## 2014-07-24 NOTE — ED Notes (Signed)
[facility]  Patient declines to talk with this writer at this time, yelling and cursing.  Patient is in forensic restraints at this time and [employer] remain with patient.  IVC states prior hospitalizations for drug abuse issues, drug abuse psychosis, not sleeping, not taking care of his personal hygiene, hallucinating and talking to people who are not there, has been walking the streets, Mother states patient's feet are torn and bloody from walking.  IVC states he is a danger to himself and others.

## 2014-07-24 NOTE — Progress Notes (Signed)
[facility] [place],   Provided pt with [employer] Orange Card application, highlighting [facility] to help patient with primary care and outpatient mental health services.

## 2014-07-24 NOTE — Progress Notes (Signed)
Patient ID: Vincent [name], male   DOB: 03/22/1996, [age] y.o.   MRN: [id number]  Vincent [name] is a 18 year old male IVC'd for psychosis and substance abuse. Patient reports he does not need to be here and that he was brought in against his will. Patient denies SI/HI and A/V hallucinations. Patient reports that he was here before (pt. Was here in March) for an "intense reason." However, patient reports this was a mistake. Patient denies using THC and denies the need for medications. Patient does not have prior medical history other than cannabis use and paranoid schizophrenia. Patient denies pain at this time. Patient was oriented to the unit and verbalized understanding of the admission process. Patient received dinner and was taken to his room. Q15 minute safety checks initiated and maintained. No distress noted at this time.

## 2014-07-24 NOTE — Progress Notes (Signed)
This is a 18 years old [place] American male admitted to the unit at about 0530 am . He was involuntarily committed by his mother. Mother reported in IVC paper work that patient has history of substance abuse and psychosis. Per report patient has not been taking care of himself and has been walking the street, not taking his med and not sleeping. Received report from ER nurse that patient was agitated and verbally aggressive  And was tazzed by the police. Patient appeared drowsy and unwilling to answer assessment questions. Q 15 minute check initiated.

## 2014-07-24 NOTE — ED Provider Notes (Signed)
CSN: [id number]     Arrival date & time 07/24/14  [id number] History   First MD Initiated Contact with Patient 07/24/14 [phone]     Chief Complaint  Patient presents with  . Psychotic     (Consider location/radiation/quality/duration/timing/severity/associated sxs/prior Treatment) HPI Patient brought in by [employer] under IVC for hallucinations, acute psychosis. Found walking around the streets, disheveled. Police tazed patient when he became combative. He states he is no longer having any hallucinations. He denies any suicidal or homicidal ideation. Admits to intermittently taking psychiatric medications. Past Medical History  Diagnosis Date  . Anxiety    Past Surgical History  Procedure Laterality Date  . Mandible surgery     No family history on file. History  Substance Use Topics  . Smoking status: Never Smoker   . Smokeless tobacco: Never Used  . Alcohol Use: No    Review of Systems  Constitutional: Negative for fever and chills.  Respiratory: Negative for shortness of breath.   Cardiovascular: Negative for chest pain.  Gastrointestinal: Negative for nausea, vomiting and abdominal pain.  Musculoskeletal: Negative for back pain, neck pain and neck stiffness.  Neurological: Negative for dizziness, seizures, syncope, weakness, numbness and headaches.  Psychiatric/Behavioral: Positive for hallucinations, behavioral problems and agitation. Negative for suicidal ideas.  All other systems reviewed and are negative.     Allergies  Review of patient's allergies indicates no known allergies.  Home Medications   Prior to Admission medications   Medication Sig Start Date End Date Taking? Authorizing Provider  risperiDONE (RISPERDAL) 2 MG tablet Take 1 tablet (2 mg total) by mouth at bedtime. 12/06/13   Fransisca KaufmannLaura Davis, NP  traZODone (DESYREL) 50 MG tablet Take 1 tablet (50 mg total) by mouth at bedtime as needed for sleep. 12/06/13   Fransisca KaufmannLaura Davis, NP   BP 134/93 mmHg  Pulse 87  Temp(Src) 98  F (36.7 C) (Oral)  Resp 18  SpO2 100% Physical Exam  Constitutional: He is oriented to person, place, and time. He appears well-developed and well-nourished. No distress.  Calm and cooperative  HENT:  Head: Normocephalic and atraumatic.  Mouth/Throat: Oropharynx is clear and moist. No oropharyngeal exudate.  No obvious head trauma  Eyes: EOM are normal. Pupils are equal, round, and reactive to light.  Neck: Normal range of motion. Neck supple.  No meningismus. No posterior midline cervical tenderness to palpation.   Cardiovascular: Normal rate and regular rhythm.   Pulmonary/Chest: Effort normal and breath sounds normal. No respiratory distress. He has no wheezes. He has no rales. He exhibits no tenderness.  Abdominal: Soft. Bowel sounds are normal. He exhibits no distension and no mass. There is no tenderness. There is no rebound and no guarding.  Musculoskeletal: Normal range of motion. He exhibits no edema or tenderness.  Neurological: He is alert and oriented to person, place, and time.  5/5 motor in all extremities. Sensation is grossly intact.  Skin: Skin is warm and dry. No rash noted. No erythema.  Psychiatric:  Patient denies hallucinations, suicidal or homicidal ideation. He has poor insight. Poor eye contact.  Nursing note and vitals reviewed.   ED Course  Procedures (including critical care time) Labs Review Labs Reviewed  COMPREHENSIVE METABOLIC PANEL - Abnormal; Notable for the following:    Glucose, Bld 104 (*)    All other components within normal limits  URINE RAPID DRUG SCREEN (HOSP PERFORMED) - Abnormal; Notable for the following:    Tetrahydrocannabinol POSITIVE (*)    All other components within normal limits  SALICYLATE LEVEL - Abnormal; Notable for the following:    Salicylate Lvl <2.0 (*)    All other components within normal limits  CBC  ETHANOL  ACETAMINOPHEN LEVEL    Imaging Review No results found.   EKG Interpretation None      MDM    Final diagnoses:  None    Patient is medically cleared for psychiatric evaluation.    Loren Raceravid Lisette Mancebo, MD 07/25/14 [phone]

## 2014-07-24 NOTE — Progress Notes (Signed)
Patient transferred per MD orders. Patient given education regarding transfer and ordered medications. Patient denies any concerns about these instructions. Patient ambulatory and pleasant on discharge. Report called earlier to [place], [facility]. Patient to go to 507-2.

## 2014-07-24 NOTE — BH Assessment (Signed)
[facility] Dr. Jannifer FranklinAkintayo and Assunta FoundShuvon Rankin, NP. The room assignment is 507-2. Nursing report [phone]

## 2014-07-24 NOTE — ED Notes (Signed)
Went in and talked to pt  Pt calm and cooperative with me  Pt states he does not know why he is here  Pt upset because he was tazed by police  Pt changed into scrubs, blood and urine obtained, and wanded by security  Pt given sandwich and sprite

## 2014-07-24 NOTE — Progress Notes (Signed)
Did not attend group 

## 2014-07-24 NOTE — Tx Team (Signed)
Initial Interdisciplinary Treatment Plan   PATIENT STRESSORS: Medication change or noncompliance Substance abuse   PROBLEM LIST: Problem List/Patient Goals Date to be addressed Date deferred Reason deferred Estimated date of resolution  Psychosis 07/24/2014           Substance Abuse 07/24/2014                                          DISCHARGE CRITERIA:  Improved stabilization in mood, thinking, and/or behavior Motivation to continue treatment in a less acute level of care Verbal commitment to aftercare and medication compliance  PRELIMINARY DISCHARGE PLAN: Attend PHP/IOP Outpatient therapy  PATIENT/FAMIILY INVOLVEMENT: This treatment plan has been presented to and reviewed with the patient, Caryl PinaRodney D Roberts Knight.  The patient and family have been given the opportunity to ask questions and make suggestions.  Marzetta BoardDopson, Khiyan Crace E 07/24/2014, 6:09 PM

## 2014-07-24 NOTE — Consult Note (Signed)
Vincent Psychiatry [name]   Reason for [name]:  Agitation aggressive behavior Referring Physician:  EDP  Vincent [name] is an 18 y.o. male. Total Time spent with patient: 45 minutes  Assessment: AXIS I:  Paranoid Schizophrenia   AXIS II:  Deferred AXIS III:   Past Medical History  Diagnosis Date  . Anxiety    AXIS IV:  other psychosocial or environmental problems, problems related to social environment and problems with primary support group AXIS V:  11-20 some danger of hurting self or others possible OR occasionally fails to maintain minimal personal hygiene OR gross impairment in communication  Plan:  Recommend psychiatric Inpatient admission when medically cleared.  Subjective:   Vincent [name] is a 18 y.o. male patient presents [facility] under [facility].  Patient states that he is here for no reason.  "Make me want to hurt my [place]; fuck them up with their lying ass.  I was about to walk out the door and they didn't want me to leave; they had called the police.  The police tazed me.  Their ass is worrisome."  Patient states that he has been on psychiatric medications "for psychosis" before and in psychotic hospital; but is not taking any medication at this time or outpatient services.  Patient denies suicidal/homicidal ideation. Patient did now answer about hearing voice or paranoia.     HPI Elements:   Location:  Mood disorder. Quality:  aggression. Severity:  Not taking medications. Timing:  several months. Review of Systems  Psychiatric/Behavioral: Positive for memory loss and substance abuse. Negative for depression and suicidal ideas. The patient is nervous/anxious and has insomnia.   All other systems reviewed and are negative.   Past Psychiatric History: Past Medical History  Diagnosis Date  . Anxiety     reports that he has never smoked. He has never used smokeless tobacco. He reports that he uses illicit drugs (Marijuana). He reports  that he does not drink alcohol. No family history on file.         Allergies:  No Known Allergies  ACT Assessment Complete:  Yes:    Educational Status    Risk to Self: Risk to self with the past 6 months Is patient at risk for suicide?:  (Patient is under IVC) Substance abuse history and/or treatment for substance abuse?: Yes  Risk to Others:    Abuse:    Prior Inpatient Therapy:    Prior Outpatient Therapy:    Additional Information:                    Objective: Blood pressure 125/49, pulse 62, temperature 98 F (36.7 C), temperature source Oral, resp. rate 14, SpO2 100 %.There is no weight on file to calculate BMI. Results for orders placed or performed during the hospital encounter of 07/24/14 (from the past 72 hour(s))  CBC     Status: None   Collection Time: 07/24/14  3:53 AM  Result Value Ref Range   WBC 9.1 4.0 - 10.5 K/uL   RBC 5.20 4.22 - 5.81 MIL/uL   Hemoglobin 15.4 13.0 - 17.0 g/dL   HCT 44.4 39.0 - 52.0 %   MCV 85.4 78.0 - 100.0 fL   MCH 29.6 26.0 - 34.0 pg   MCHC 34.7 30.0 - 36.0 g/dL   RDW 12.7 11.5 - 15.5 %   Platelets 245 150 - 400 K/uL  Comprehensive metabolic panel     Status: Abnormal   Collection Time:  07/24/14  3:53 AM  Result Value Ref Range   Sodium 139 137 - 147 mEq/L   Potassium 3.7 3.7 - 5.3 mEq/L   Chloride 101 96 - 112 mEq/L   CO2 27 19 - 32 mEq/L   Glucose, Bld 104 (H) 70 - 99 mg/dL   BUN 16 6 - 23 mg/dL   Creatinine, Ser 0.97 0.50 - 1.35 mg/dL   Calcium 9.8 8.4 - 10.5 mg/dL   Total Protein 7.5 6.0 - 8.3 g/dL   Albumin 4.4 3.5 - 5.2 g/dL   AST 34 0 - 37 U/L   ALT 27 0 - 53 U/L   Alkaline Phosphatase 76 39 - 117 U/L   Total Bilirubin 0.6 0.3 - 1.2 mg/dL   GFR calc non Af Amer >90 >90 mL/min   GFR calc Af Amer >90 >90 mL/min    Comment: (NOTE) The eGFR has been calculated using the CKD EPI equation. This calculation has not been validated in all clinical situations. eGFR's persistently <90 mL/min signify possible  Chronic Kidney Disease.    Anion gap 11 5 - 15  Ethanol (ETOH)     Status: None   Collection Time: 07/24/14  3:53 AM  Result Value Ref Range   Alcohol, Ethyl (B) <11 0 - 11 mg/dL    Comment:        LOWEST DETECTABLE LIMIT FOR SERUM ALCOHOL IS 11 mg/dL FOR MEDICAL PURPOSES ONLY   Acetaminophen level     Status: None   Collection Time: 07/24/14  3:53 AM  Result Value Ref Range   Acetaminophen (Tylenol), Serum <15.0 10 - 30 ug/mL    Comment:        THERAPEUTIC CONCENTRATIONS VARY SIGNIFICANTLY. A RANGE OF 10-30 ug/mL MAY BE AN EFFECTIVE CONCENTRATION FOR MANY PATIENTS. HOWEVER, SOME ARE BEST TREATED AT CONCENTRATIONS OUTSIDE THIS RANGE. ACETAMINOPHEN CONCENTRATIONS >150 ug/mL AT 4 HOURS AFTER INGESTION AND >50 ug/mL AT 12 HOURS AFTER INGESTION ARE OFTEN ASSOCIATED WITH TOXIC REACTIONS.   Salicylate level     Status: Abnormal   Collection Time: 07/24/14  3:53 AM  Result Value Ref Range   Salicylate Lvl <8.9 (L) 2.8 - 20.0 mg/dL  Urine rapid drug screen (hosp performed)     Status: Abnormal   Collection Time: 07/24/14  4:01 AM  Result Value Ref Range   Opiates NONE DETECTED NONE DETECTED   Cocaine NONE DETECTED NONE DETECTED   Benzodiazepines NONE DETECTED NONE DETECTED   Amphetamines NONE DETECTED NONE DETECTED   Tetrahydrocannabinol POSITIVE (A) NONE DETECTED   Barbiturates NONE DETECTED NONE DETECTED    Comment:        DRUG SCREEN FOR MEDICAL PURPOSES ONLY.  IF CONFIRMATION IS NEEDED FOR ANY PURPOSE, NOTIFY LAB WITHIN 5 DAYS.        LOWEST DETECTABLE LIMITS FOR URINE DRUG SCREEN Drug Class       Cutoff (ng/mL) Amphetamine      1000 Barbiturate      200 Benzodiazepine   211 Tricyclics       941 Opiates          300 Cocaine          300 THC              50    Labs are reviewed see above values.  Medications reviewed no changes made  No current facility-administered medications for this encounter.   Current Outpatient Prescriptions  Medication Sig  Dispense Refill  . risperiDONE (RISPERDAL) 2 MG tablet Take  1 tablet (2 mg total) by mouth at bedtime. 30 tablet 0  . traZODone (DESYREL) 50 MG tablet Take 1 tablet (50 mg total) by mouth at bedtime as needed for sleep. 30 tablet 0    Psychiatric Specialty Exam:     Blood pressure 125/49, pulse 62, temperature 98 F (36.7 C), temperature source Oral, resp. rate 14, SpO2 100 %.There is no weight on file to calculate BMI.  General Appearance: Casual  Eye Contact::  Fair  Speech:  Clear and Coherent  Volume:  Normal  Mood:  Angry and Irritable  Affect:  Blunt and Congruent  Thought Process:  Circumstantial  Orientation:  Full (Time, Place, and Person)  Thought Content:  Paranoid Ideation  Suicidal Thoughts:  No  Homicidal Thoughts:  No  Memory:  Immediate;   Fair Recent;   Fair Remote;   Fair  Judgement:  Impaired  Insight:  Fair  Psychomotor Activity:  Normal  Concentration:  Fair  Recall:  [employer] [place]: Fair  Akathisia:  No  Handed:  Right  AIMS (if indicated):     Assets:  Desire for Improvement Housing Social Support  Sleep:      Musculoskeletal: Strength & Muscle Tone: within normal limits Gait & Station: normal Patient leans: N/A  Treatment Plan Summary: Daily contact with patient to assess and evaluate symptoms and progress in treatment Medication management Inpatient treatment recommended   Patient accepted to [facility] 507/02  Rankin, Shuvon, FNP-BC 07/24/2014 1:20 PM  Patient seen, evaluated and I agree with notes by Nurse Practitioner. Corena Pilgrim, MD

## 2014-07-25 DIAGNOSIS — F29 Unspecified psychosis not due to a substance or known physiological condition: Secondary | ICD-10-CM

## 2014-07-25 DIAGNOSIS — F1919 Other psychoactive substance abuse with unspecified psychoactive substance-induced disorder: Secondary | ICD-10-CM

## 2014-07-25 DIAGNOSIS — F39 Unspecified mood [affective] disorder: Secondary | ICD-10-CM

## 2014-07-25 MED ORDER — RISPERIDONE 2 MG PO TBDP
2.0000 mg | ORAL_TABLET | Freq: Once | ORAL | Status: AC
  Administered 2014-07-25: 2 mg via ORAL
  Filled 2014-07-25: qty 1
  Filled 2014-07-25: qty 2

## 2014-07-25 MED ORDER — LORAZEPAM 1 MG PO TABS
2.0000 mg | ORAL_TABLET | Freq: Once | ORAL | Status: AC
  Administered 2014-07-25: 2 mg via ORAL
  Filled 2014-07-25: qty 2

## 2014-07-25 MED ORDER — RISPERIDONE 2 MG PO TBDP
2.0000 mg | ORAL_TABLET | Freq: Three times a day (TID) | ORAL | Status: DC | PRN
  Administered 2014-07-25 – 2014-07-28 (×5): 2 mg via ORAL
  Filled 2014-07-25 (×7): qty 2

## 2014-07-25 MED ORDER — LORAZEPAM 1 MG PO TABS
1.0000 mg | ORAL_TABLET | ORAL | Status: DC | PRN
  Administered 2014-07-28: 1 mg via ORAL
  Filled 2014-07-25: qty 1

## 2014-07-25 NOTE — Progress Notes (Signed)
D: Pt has guarded affect, irritable mood.  Pt frequently paces hallway.  When writer asked to speak with pt, pt stated "no."  Pt is loud at times and uses profanity frequently.  Pt denies SI/HI, stating "I've never felt that way."  Pt denies hallucinations, denies pain.  He reports "I need to get out of here.  I don't even need to fucking be here, man." A: Medications administered per order.  PRN medication administered for agitation, see flowsheet.  Encouraged pt to report needs and concerns.  Safety maintained. R: Pt is compliant with medications.  Pt left during evening group.  He verbally contracts for safety and is in no distress.  Will continue to monitor and assess for safety.

## 2014-07-25 NOTE — H&P (Addendum)
Psychiatric Admission Assessment Adult  Patient Identification:  Vincent [name] Date of Evaluation:  07/25/2014 Chief Complaint: " I don't need to be here" History of Present Illness:  Per ED HPI:  Patient brought in by [employer] under IVC for hallucinations, acute psychosis. Found walking around the streets, disheveled. Police tazed patient when he became combative. He states he is no longer having any hallucinations. He denies any suicidal or homicidal ideation. Admits to intermittently taking psychiatric medications Per Psych consult:  Vincent [name] is a 18 y.o. male patient presents [facility] under [facility]. Patient states that he is here for no reason. "Make me want to hurt my Mama and Grandma; fuck them up with their lying ass. I was about to walk out the door and they didn't want me to leave; they had called the police. The police tazed me. Their ass is worrisome." Patient states that he has been on psychiatric medications "for psychosis" before and in psychotic hospital; but is not taking any medication at this time or outpatient services. Patient denies suicidal/homicidal ideation. Patient did now answer about hearing voice or paranoia.   On admission interview, clearly Francesco was angry and used profane language throughout conversation.  He denied any wrong doing that lead him being petitioned here.  He verbalized anger and resentment toward parents and his grandmother.    Elements:  Location: Mood disorder. Quality: aggression. Severity: Not taking medications. Timing: several months. Review of Systems  Psychiatric/Behavioral: Positive for memory loss and substance abuse. Negative for depression and suicidal ideas. The patient is nervous/anxious and has insomnia.  All other systems reviewed and are negative.  Associated Signs/Synptoms: Depression Symptoms:  depressed mood, anhedonia, (Hypo) Manic Symptoms:  Irritable Mood, Anxiety Symptoms:  NA Psychotic Symptoms:   NA PTSD Symptoms: NA Total Time spent with patient: 45 minutes  Psychiatric Specialty Exam: Physical Exam  Vitals reviewed. Skin: Rash (states that small pock marks are from mosquito bites, throughout bilateral arms) noted.  Psychiatric: Judgment and thought content normal. His affect is angry and blunt. His speech is slurred. He is agitated. Cognition and memory are normal.    Review of Systems  Constitutional: Negative.   HENT: Negative.   Eyes: Negative.   Respiratory: Negative.   Cardiovascular: Negative.   Gastrointestinal: Negative.   Genitourinary: Negative.   Musculoskeletal: Negative.   Skin: Negative.   Neurological: Negative.   Endo/Heme/Allergies: Negative.   Psychiatric/Behavioral: Positive for depression. Negative for suicidal ideas, hallucinations, memory loss and substance abuse. The patient is nervous/anxious. The patient does not have insomnia.     Blood pressure 137/86, pulse 105, temperature 97.7 F (36.5 C), temperature source Oral, resp. rate 18, height 6' (1.829 m), weight 80.287 kg (177 lb), SpO2 100 %.Body mass index is 24 kg/(m^2).  General Appearance: Guarded  Eye Contact::  Poor  Speech:  Garbled  Volume:  Normal  Mood:  Angry  Affect:  Blunt and Labile  Thought Process:  Disorganized  Orientation:  Full (Time, Place, and Person)  Thought Content:  Rumination  Suicidal Thoughts:  No  Homicidal Thoughts:  No  Memory:  Immediate;   Fair Recent;   Fair Remote;   Fair  Judgement:  Poor  Insight:  Shallow  Psychomotor Activity:  Normal  Concentration:  Fair  Recall:  Fair  Fund of Knowledge:Fair  Language: Fair  Akathisia:  Negative  Handed:  Right  AIMS (if indicated):     Assets:  Resilience  Sleep:  Number of Hours: 6.75      Musculoskeletal: Strength & Muscle Tone: within normal limits Gait & Station: normal Patient leans: N/A  Past Psychiatric History: Diagnosis:  Marijuana abuse, Psychosis   Hospitalizations:  [facility]  Outpatient  Care:  [facility]  Substance Abuse Care:  [facility]  Self-Mutilation:  Denied  Suicidal Attempts:  Denied  Violent Behaviors:  Denied   Past Medical History:   Past Medical History  Diagnosis Date  . Anxiety    None. Allergies:   Allergies  Allergen Reactions  . Shrimp [Shellfish Allergy] Anaphylaxis   PTA Medications: Prescriptions prior to admission  Medication Sig Dispense Refill Last Dose  . risperiDONE (RISPERDAL) 2 MG tablet Take 1 tablet (2 mg total) by mouth at bedtime. 30 tablet 0 Past Week at Unknown time  . traZODone (DESYREL) 50 MG tablet Take 1 tablet (50 mg total) by mouth at bedtime as needed for sleep. 30 tablet 0  at unknown    Previous Psychotropic Medications:  Medication/Dose  Doesn't recall meds taken and what they were for.               Substance Abuse History in the last 12 months:  No.  Consequences of Substance Abuse: NA  Social History:  reports that he has been smoking.  He has never used smokeless tobacco. He reports that he uses illicit drugs (Marijuana). He reports that he does not drink alcohol. Additional Social History:  Current Place of Residence:  [place] Place of Birth:  [place] Family Members: Marital Status:  Single Children:  Sons:  Daughters: Relationships: Education:  [employer] Problems/Performance: Religious Beliefs/Practices: History of Abuse (Emotional/Phsycial/Sexual) Ship broker History:  None. Legal History: Hobbies/Interests:  Family History:  History reviewed. No pertinent family history.  Results for orders placed or performed during the hospital encounter of 07/24/14 (from the past 72 hour(s))  CBC     Status: None   Collection Time: 07/24/14  3:53 AM  Result Value Ref Range   WBC 9.1 4.0 - 10.5 K/uL   RBC 5.20 4.22 - 5.81 MIL/uL   Hemoglobin 15.4 13.0 - 17.0 g/dL   HCT 44.4 39.0 - 52.0 %   MCV 85.4 78.0 - 100.0 fL   MCH 29.6 26.0 - 34.0 pg   MCHC 34.7  30.0 - 36.0 g/dL   RDW 12.7 11.5 - 15.5 %   Platelets 245 150 - 400 K/uL  Comprehensive metabolic panel     Status: Abnormal   Collection Time: 07/24/14  3:53 AM  Result Value Ref Range   Sodium 139 137 - 147 mEq/L   Potassium 3.7 3.7 - 5.3 mEq/L   Chloride 101 96 - 112 mEq/L   CO2 27 19 - 32 mEq/L   Glucose, Bld 104 (H) 70 - 99 mg/dL   BUN 16 6 - 23 mg/dL   Creatinine, Ser 0.97 0.50 - 1.35 mg/dL   Calcium 9.8 8.4 - 10.5 mg/dL   Total Protein 7.5 6.0 - 8.3 g/dL   Albumin 4.4 3.5 - 5.2 g/dL   AST 34 0 - 37 U/L   ALT 27 0 - 53 U/L   Alkaline Phosphatase 76 39 - 117 U/L   Total Bilirubin 0.6 0.3 - 1.2 mg/dL   GFR calc non Af Amer >90 >90 mL/min   GFR calc Af Amer >90 >90 mL/min    Comment: (NOTE) The eGFR has been calculated using the CKD EPI equation. This calculation has not been validated in all clinical situations. eGFR's persistently <90 mL/min signify possible Chronic Kidney Disease.  Anion gap 11 5 - 15  Ethanol (ETOH)     Status: None   Collection Time: 07/24/14  3:53 AM  Result Value Ref Range   Alcohol, Ethyl (B) <11 0 - 11 mg/dL    Comment:        LOWEST DETECTABLE LIMIT FOR SERUM ALCOHOL IS 11 mg/dL FOR MEDICAL PURPOSES ONLY   Acetaminophen level     Status: None   Collection Time: 07/24/14  3:53 AM  Result Value Ref Range   Acetaminophen (Tylenol), Serum <15.0 10 - 30 ug/mL    Comment:        THERAPEUTIC CONCENTRATIONS VARY SIGNIFICANTLY. A RANGE OF 10-30 ug/mL MAY BE AN EFFECTIVE CONCENTRATION FOR MANY PATIENTS. HOWEVER, SOME ARE BEST TREATED AT CONCENTRATIONS OUTSIDE THIS RANGE. ACETAMINOPHEN CONCENTRATIONS >150 ug/mL AT 4 HOURS AFTER INGESTION AND >50 ug/mL AT 12 HOURS AFTER INGESTION ARE OFTEN ASSOCIATED WITH TOXIC REACTIONS.   Salicylate level     Status: Abnormal   Collection Time: 07/24/14  3:53 AM  Result Value Ref Range   Salicylate Lvl <6.8 (L) 2.8 - 20.0 mg/dL  Urine rapid drug screen (hosp performed)     Status: Abnormal    Collection Time: 07/24/14  4:01 AM  Result Value Ref Range   Opiates NONE DETECTED NONE DETECTED   Cocaine NONE DETECTED NONE DETECTED   Benzodiazepines NONE DETECTED NONE DETECTED   Amphetamines NONE DETECTED NONE DETECTED   Tetrahydrocannabinol POSITIVE (A) NONE DETECTED   Barbiturates NONE DETECTED NONE DETECTED    Comment:        DRUG SCREEN FOR MEDICAL PURPOSES ONLY.  IF CONFIRMATION IS NEEDED FOR ANY PURPOSE, NOTIFY LAB WITHIN 5 DAYS.        LOWEST DETECTABLE LIMITS FOR URINE DRUG SCREEN Drug Class       Cutoff (ng/mL) Amphetamine      1000 Barbiturate      200 Benzodiazepine   127 Tricyclics       517 Opiates          300 Cocaine          300 THC              50    Psychological Evaluations:  Assessment:   DSM5:  Schizophrenia Disorders:  NA Obsessive-Compulsive Disorders:  NA Trauma-Stressor Disorders:  NA Substance/Addictive Disorders:  NA Depressive Disorders:  NA  AXIS I:  Mood Disorder NOS, Psychotic Disorder NOS and Substance Abuse AXIS II:  Deferred AXIS III:   Past Medical History  Diagnosis Date  . Anxiety    AXIS IV:  economic problems, educational problems, housing problems, occupational problems and other psychosocial or environmental problems AXIS V:  51-60 moderate symptoms  Treatment Plan/Recommendations:   Observation Level/Precautions:  15 minute checks  Laboratory:  Labs resulted, reviewed, and stable at this time.   Psychotherapy:  Group therapy, individual therapy, psychoeducation  Medications:  See MAR above  Consultations: None    Discharge Concerns: None    Estimated LOS: 5-7 days  Other:  N/A    Treatment Plan Summary: Daily contact with patient to assess and evaluate symptoms and progress in treatment Medication management Current Medications:  Current Facility-Administered Medications  Medication Dose Route Frequency Provider Last Rate Last Dose  . acetaminophen (TYLENOL) tablet 650 mg  650 mg Oral Q6H PRN Shuvon Rankin,  NP      . alum & mag hydroxide-simeth (MAALOX/MYLANTA) 200-200-20 MG/5ML suspension 30 mL  30 mL Oral Q4H PRN Shuvon Rankin, NP      .  magnesium hydroxide (MILK OF MAGNESIA) suspension 30 mL  30 mL Oral Daily PRN Shuvon Rankin, NP      . nicotine (NICODERM CQ - dosed in mg/24 hours) patch 14 mg  14 mg Transdermal Daily Ursula Alert, MD   14 mg at 07/25/14 0805  . risperiDONE (RISPERDAL) tablet 2 mg  2 mg Oral QHS Shuvon Rankin, NP   2 mg at 07/24/14 2106  . traZODone (DESYREL) tablet 50 mg  50 mg Oral QHS PRN Shuvon Rankin, NP        Observation Level/Precautions:  15 minute checks  Laboratory:  Per ED  Psychotherapy:  Group milieu  Medications:  Per med list  Consultations:  As needed  Discharge Concerns:  safety  Estimated LOS:  2-5 days  Other:     I certify that inpatient services furnished can reasonably be expected to improve the patient's condition.   Kerrie Buffalo MAY, AGNP-BC 11/6/20152:36 PM   Patient Case discussed with NP as above, and I have met with patient. Agree with NP's Note, Assessment and Plan Patient is a 18 year old man, he is a poor historian, and is quite irritable and angry. He uses profanity throughout session. He repeatedly states that he sees no reason why he should be here. As per notes, he was disorganized, hallucinating, dishevelled and became combative when police approached him to where he was reportedly tazed in order to subdue him. He makes strange statements that he was being told to hurt his mother and grandmother, but does not elaborate, does not currently endorse active HI or violent ideations, and denies hallucinations. He had a prior admission here in March of 2015 for psychosis, at the time felt to possibly substance/drug induced. Patient's UDS is positive for cannabis. At that time he was stabilized with Risperidone. Overall he is quite irritable and difficult to assess at the present time. Will manage with Risperidone and Ativan PRNS for  agitation.

## 2014-07-25 NOTE — Progress Notes (Signed)
D. Pt was up and visible this evening, unable to attend or participate in evening group session. Pt seen pacing with blanket around him, looking around suspiciously and appeared agitated this evening. Pt had minimal interaction with staff of peers and did receive risperdal without issue but refused other PRN medication to assist with sleep or agitation. A. Support and encouragement provided. R. Safety maintained, will continue to monitor.

## 2014-07-25 NOTE — Clinical Social Work Note (Signed)
Attempted to reach pt's mother Karis Juba([place]) for collateral info. Number disconnected. [phone]  Irlene Crudup Smart, LCSWA 07/25/2014 2:35 PM

## 2014-07-25 NOTE — [facility] Group Notes (Signed)
[facility] LCSW Group Therapy  07/25/2014 1:17 PM  Type of Therapy:  Group Therapy  Participation Level:  Did Not Attend   Hyatt,Candace 07/25/2014, 1:17 PM

## 2014-07-25 NOTE — [facility] Group Notes (Signed)
[name]   07/25/2014 10:49 AM  Participation Quality:  Invited-did not attend (pt sleeping in room)   Smart, Herbert SetaHeather LCSWA

## 2014-07-25 NOTE — Tx Team (Signed)
Interdisciplinary Treatment Plan Update (Adult)   Date: 07/25/2014   Time Reviewed: 11:37 AM  Progress in Treatment:  Attending groups: No  Participating in groups: No   Taking medication as prescribed: Yes  Tolerating medication: Yes  Family/Significant othe contact made: Not yet. SPE and collateral info needed.  Patient understands diagnosis: No insight at this time.  Discussing patient identified problems/goals with staff: Yes  Medical problems stabilized or resolved: Yes  Denies suicidal/homicidal ideation: Yes  Patient has not harmed self or Others: Yes  New problem(s) identified:  Discharge Plan or Barriers: Pt refusing to leave room/has been sleeping most of the morning. Unable to complete PSA at this time. CSW continuing to assess for appropriate referrals.  Additional comments: Vincent [name] Knight is a 18 y.o. male patient presents [facility] under IVC. Patient states that he is here for no reason. "Make me want to hurt my Mama and Grandma; fuck them up with their lying ass. I was about to walk out the door and they didn't want me to leave; they had called the police. The police tazed me. Their ass is worrisome." Patient states that he has been on psychiatric medications "for psychosis" before and in psychotic hospital; but is not taking any medication at this time or outpatient services. Patient denies suicidal/homicidal ideation. Patient did now answer about hearing voice or paranoia.  Reason for Continuation of Hospitalization: HI-mom/grandma Pschosis-possible AV[facility] Mood stabilization Med management Estimated length of stay: 5-7 days  For review of initial/current patient goals, please see plan of care.  Attendees:  Patient:    Family:    Physician: Dr. Elna BreslowEappen MD 07/25/2014 11:33 AM   Nursing: Lucretia FieldLinsey, Patty RN 07/25/2014 11:33 AM   Clinical Social Worker Reigan Tolliver Smart, LCSWA  07/25/2014 11:33 AM   Other: Daryel Geraldodney North, LCSW 07/25/2014 11:33 AM   Other: Darden DatesJennifer C. Nurse CM  07/25/2014 11:33 AM   Other: Massie Kluverelores Sutton, Community Care Coordinator  07/25/2014 11:33 AM   Other: Charleston Ropesandace Hyatt, CSW Intern 07/25/2014 11:33AM  Scribe for Treatment Team:  The Sherwin-WilliamsHeather Smart LCSWA 07/25/2014 11:33AM

## 2014-07-25 NOTE — [facility] Suicide Risk Assessment (Signed)
   Nursing information obtained from:  Patient Demographic factors:  Male, Adolescent or young adult, Unemployed Current Mental Status:  NA Loss Factors:  Loss of significant relationship Historical Factors:  Impulsivity Risk Reduction Factors:  Living with another person, especially a relative Total Time spent with patient: 45 minutes  CLINICAL FACTORS:  Disorganized Behavior, Anger, Irritability  Psychiatric Specialty Exam: Physical Exam  ROS  Blood pressure 137/86, pulse 105, temperature 97.7 F (36.5 C), temperature source Oral, resp. rate 18, height 6' (1.829 m), weight 80.287 kg (177 lb), SpO2 100 %.Body mass index is 24 kg/(m^2).  General Appearance: Fairly Groomed  Patent attorneyye Contact::  Fair  Speech:  Normal Rate  Volume:  variable , loud at times, a lot of profanity used  Mood:  Irritable  Affect:  Congruent and Labile  Thought Process:  Disorganized  Orientation:  Other:  fully alert and attentive  Thought Content:  Rumination and currenlty denies hallucinations,no delusions are expressed. Repeatedly expressed frustration about being in the hospital.  Suicidal Thoughts:  No- at this time denies any suicidal ideations  Homicidal Thoughts:  No- at this time denies any homicidal ideations, but made statement that " they wanted me to hurt my mother and grandmother". He denies any thoughts of violence towards these family members at this time  Memory:  recent and remote grossly intact  Judgement:  Impaired  Insight:  Lacking  Psychomotor Activity:  Normal  Concentration:  Fair  Recall:  Good  Fund of Knowledge:Good  Language: Fair  Akathisia:  Negative  Handed:  Right  AIMS (if indicated):     Assets:  Desire for Improvement Physical Health Resilience  Sleep:  Number of Hours: 6.75   Musculoskeletal: Strength & Muscle Tone: within normal limits Gait & Station: normal Patient leans: N/A  COGNITIVE FEATURES THAT CONTRIBUTE TO RISK:  Closed-mindedness    SUICIDE RISK:    Moderate:  Frequent suicidal ideation with limited intensity, and duration, some specificity in terms of plans, no associated intent, good self-control, limited dysphoria/symptomatology, some risk factors present, and identifiable protective factors, including available and accessible social support.  PLAN OF CARE:  I certify that inpatient services furnished can reasonably be expected to improve the patient's condition.  Landrum Carbonell 07/25/2014, 3:57 PM

## 2014-07-25 NOTE — [facility] Group Notes (Signed)
[facility] LCSW Group Therapy  07/25/2014  1:05 PM  Type of Therapy:  Group therapy  Participation Level: Was not invited, and did not attend.  Summary of Progress/Problems:  Chaplain was here to lead a group on themes of hope and courage.  Vincent [name], Lasean B 07/25/2014 2:42 PM
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
## 2014-07-25 NOTE — Progress Notes (Signed)
Patient ID: Vincent [name], male   DOB: 09/09/1996, 18 y.o.   MRN: [id number] D. Patient presents with irritable mood, affect blunted. Vincent [name] presents with disheveled appearance, malodorous on the unit. He remains guarded, paranoid and not forthcoming with Clinical research associatewriter. He has been noted to stare bizarrely and has been isolative throughout shift. When writer approached pt this morning he states '' Oh because of some bitches ! '' Patient became irritable and cursing under breath. He states '' I don't need to be in here! '' A. Medications given as ordered. Support and encouragement provided. Discussed above information with treatment team. R. Patient is safe. In no acute distress at this time. Will continue to monitor q 15 minutes for safety.

## 2014-07-25 NOTE — Progress Notes (Signed)
Psychoeducational Group Note  Date:  07/25/2014 Time:  2249  Group Topic/Focus:  Wrap-Up Group:   The focus of this group is to help patients review their daily goal of treatment and discuss progress on daily workbooks.  Participation Level: Did Not Attend  Participation Quality:  Not Applicable  Affect:  Not Applicable  Cognitive:  Not Applicable  Insight:  Not Applicable  Engagement in Group: Not Applicable  Additional Comments:  The patient did not attend group this evening.   Hazle CocaGOODMAN, Zetta Stoneman S 07/25/2014, 10:48 PM

## 2014-07-26 MED ORDER — HALOPERIDOL LACTATE 5 MG/ML IJ SOLN
5.0000 mg | Freq: Once | INTRAMUSCULAR | Status: DC
  Filled 2014-07-26: qty 1

## 2014-07-26 MED ORDER — LORAZEPAM 2 MG/ML IJ SOLN: 2.0000 mg | Freq: Once | INTRAMUSCULAR | Status: DC

## 2014-07-26 MED ORDER — LORAZEPAM 1 MG PO TABS
ORAL_TABLET | ORAL | Status: AC
  Filled 2014-07-26: qty 2

## 2014-07-26 MED ORDER — LORAZEPAM 2 MG/ML IJ SOLN
INTRAMUSCULAR | Status: AC
  Filled 2014-07-26: qty 1

## 2014-07-26 MED ORDER — HALOPERIDOL LACTATE 5 MG/ML IJ SOLN
INTRAMUSCULAR | Status: AC
  Filled 2014-07-26: qty 1

## 2014-07-26 NOTE — Progress Notes (Signed)
The focus of this group is to help patients review their daily goal of treatment and discuss progress on daily workbooks. Pt did not attend group. 

## 2014-07-26 NOTE — [facility] Group Notes (Signed)
[facility]  07/26/2014 1:04 PM  Type of Therapy:  Group Therapy  Participation Level:  Did Not Attend   Summary of Progress/Problems:  Beverly Sessionsywan J Lindsey MSW, LCSW  Clide DalesHarrill, Roshanda Balazs Campbell 07/26/2014, 1:04 PM

## 2014-07-26 NOTE — Progress Notes (Signed)
Patient ID: [name], male   DOB: 09/21/1995, 18 y.o.   MRN: [id number] D. Patient presents with irritable mood, affect congruent. Vincent [name] continues to have very poor hygiene, he is malodorous and disheveled. Vincent [name] refused any po medications this am from Clinical research associatewriter, and was noted to be shaking his head in the hallway, dancing and posturing, screaming and grunting towards staff. He was disruptive to the unit and milieu, and was unable to be verbally redirected. Show of support was called, and he did accept po medication - Risperdal M Tabs given prn with ativan prn  However patient cheeked ativan and spit out. Pt walked to his room and did respond to medications given. He remains paranoid stating '' I know that they set me up, don't want any fucking phone calls from my mother! '' A. Medications given as ordered. Support and encouragement provided. Discussed above information with Dr. Ander GasterLugo/Conrad NP. R. Patient is safe. Will continue to monitor q 15 minutes for safety.

## 2014-07-26 NOTE — Progress Notes (Signed)
[facility]  07/26/2014 1:32 PM Vincent [name]  MRN:  [id number] Subjective:  "I'm fine" Diagnosis:  Mood swings DSM5: Schizophrenia Disorders:  Paranoid schizophrenia Obsessive-Compulsive Disorders:  NA Trauma-Stressor Disorders:  NA Substance/Addictive Disorders:  NA Depressive Disorders:  NA Total Time spent with patient: 15 minutes  Axis I: Chronic Paranoid Schizophrenia  ADL's:  Impaired  Sleep: Good, hypersomnia  Appetite:  Fair  Suicidal Ideation:  Not endorsing/minimizing Homicidal Ideation:  Not endorsing  Psychiatric Specialty Exam: Physical Exam  Vitals reviewed. Psychiatric: Judgment and thought content normal. His affect is angry. He is withdrawn. Cognition and memory are normal. He is noncommunicative.    Review of Systems  Constitutional: Negative.   HENT: Negative.   Eyes: Negative.   Respiratory: Negative.   Cardiovascular: Negative.   Gastrointestinal: Negative.   Genitourinary: Negative.   Musculoskeletal: Negative.   Skin: Negative.   Neurological: Negative.   Endo/Heme/Allergies: Negative.     Blood pressure 137/86, pulse 105, temperature 97.7 F (36.5 C), temperature source Oral, resp. rate 18, height 6' (1.829 m), weight 80.287 kg (177 lb), SpO2 100 %.Body mass index is 24 kg/(m^2).  General Appearance: Disheveled  Eye Contact::  Poor  Speech:  Garbled and Slurred, not inclined to speak.  Quietly dismissive  Volume:  Decreased  Mood:  non communicative  Affect:  Flat  Thought Process:  Negative  Orientation:  Full (Time, Place, and Person)  Thought Content:  Rumination  Suicidal Thoughts:  No  Homicidal Thoughts:  No  Memory:  NA  Judgement:  Poor  Insight:  Lacking  Psychomotor Activity:  Normal  Concentration:  Poor  Recall:  Poor  Fund of Knowledge:Poor  Language: Poor  Akathisia:  Negative  Handed:  Right  AIMS (if indicated):     Assets:  Resilience  Sleep:  Number of Hours: 6.25    Musculoskeletal: Strength & Muscle Tone: within normal limits Gait & Station: normal Patient leans: N/A  Current Medications: Current Facility-Administered Medications  Medication Dose Route Frequency Provider Last Rate Last Dose  . acetaminophen (TYLENOL) tablet 650 mg  650 mg Oral Q6H PRN Vincent Rankin, NP      . alum & mag hydroxide-simeth (MAALOX/MYLANTA) 200-200-20 MG/5ML suspension 30 mL  30 mL Oral Q4H PRN Vincent Rankin, NP      . haloperidol lactate (HALDOL) 5 MG/ML injection           . haloperidol lactate (HALDOL) injection 5 mg  5 mg Intramuscular Once Vincent FannyJohn C Withrow, FNP       And  . LORazepam (ATIVAN) injection 2 mg  2 mg Intramuscular Once Vincent FannyJohn C Withrow, FNP      . LORazepam (ATIVAN) 1 MG tablet           . LORazepam (ATIVAN) 2 MG/ML injection           . LORazepam (ATIVAN) tablet 1 mg  1 mg Oral Q4H PRN Vincent SituFernando A Cobos, MD      . magnesium hydroxide (MILK OF MAGNESIA) suspension 30 mL  30 mL Oral Daily PRN Vincent Rankin, NP      . nicotine (NICODERM CQ - dosed in mg/24 hours) patch 14 mg  14 mg Transdermal Daily Vincent LongsSaramma Eappen, MD   14 mg at 07/25/14 0805  . risperiDONE (RISPERDAL M-TABS) disintegrating tablet 2 mg  2 mg Oral Q8H PRN Vincent CottaFernando A Cobos, MD   2 mg at 07/26/14 1015  . risperiDONE (RISPERDAL) tablet 2 mg  2 mg Oral  QHS Vincent Rankin, NP   2 mg at 07/25/14 2041  . traZODone (DESYREL) tablet 50 mg  50 mg Oral QHS PRN Vincent Rankin, NP        Lab Results: No results found for this or any previous visit (from the past 48 hour(s)).  Physical Findings: AIMS: Facial and Oral Movements Muscles of Facial Expression: None, normal Lips and Perioral Area: None, normal Jaw: None, normal Tongue: None, normal,Extremity Movements Upper (arms, wrists, hands, fingers): None, normal Lower (legs, knees, ankles, toes): None, normal, Trunk Movements Neck, shoulders, hips: None, normal, Overall Severity Severity of abnormal movements (highest score from questions above):  None, normal Incapacitation due to abnormal movements: None, normal Patient's awareness of abnormal movements (rate only patient's report): No Awareness, Dental Status Current problems with teeth and/or dentures?: No Does patient usually wear dentures?: No  CIWA:    COWS:     Treatment Plan Summary: Daily contact with patient to assess and evaluate symptoms and progress in treatment Medication management  Plan: Review of chart, vital signs, medications, and notes.  1-Individual and group therapy  2-Medication management for depression and anxiety: Medications reviewed with the patient and she stated no untoward effects, unchanged.  3-Coping skills for depression, anxiety  4-Continue crisis stabilization and management  5-Address health issues--monitoring vital signs, stable  6-Treatment plan in progress to prevent relapse of depression and anxiety  Medical Decision Making Problem Points:  Established problem, stable/improving (1) Data Points:  Review of medication regiment & side effects (2)  I certify that inpatient services furnished can reasonably be expected to improve the patient's condition.   Vincent [name], AGNP-BC 07/26/2014, 1:32 PM

## 2014-07-26 NOTE — Progress Notes (Signed)
Psychoeducational Group Note  Date:  07/26/2014 Time:  1015  Group Topic/Focus:  Identifying Needs:   The focus of this group is to help patients identify their personal needs that have been historically problematic and identify healthy behaviors to address their needs.  Participation Level: Did Not Attend  Participation Quality:  Not Applicable  Affect:  Not Applicable  Cognitive:  Not Applicable  Insight:  Not Applicable  Engagement in Group: Not Applicable  Additional Comments:    Breandan People Lynn 07/26/2014, 12:28 PM 

## 2014-07-26 NOTE — [facility] Group Notes (Signed)
Adult Psychoeducational Group Note  Date:  07/26/2014 Time:  5:01 PM  Group Topic/Focus:  Coping With Mental Health Crisis:   The purpose of this group is to help patients identify strategies for coping with mental health crisis.  Group discusses possible causes of crisis and ways to manage them effectively.  Participation Level:  Minimal  Participation Quality:  Attentive  Affect:  Flat  Cognitive:  Alert  Insight: Limited  Engagement in Group:  Lacking  Modes of Intervention:  Discussion, Education, Exploration, Problem-solving and Socialization  Additional Comments:  Pt was attentive in group but did not participate. Pt made a few remarks and agreed with a pt after that pt shared during group.  Tania Adedams, [place] Trawick C 07/26/2014, 5:01 PM

## 2014-07-26 NOTE — Plan of Care (Signed)
Problem: Alteration in mood & ability to function due to Goal: STG-Patient will attend groups Outcome: Not Progressing Pt did not attend evening group on 07/25/14. Goal: STG-Patient will comply with prescribed medication regimen (Patient will comply with prescribed medication regimen)  Outcome: Progressing Pt has been compliant with medications this shift.

## 2014-07-26 NOTE — Progress Notes (Signed)
D: Pt has blunted affect and irritable mood.  Pt states "I just want to know the duration of my stay.  Am I going to be here a year, 2 years, 7 days?"  Pt denies SI/HI, denies hallucinations, denies pain.  Pt paces hallway frequently, talking when nobody else is there.  Pt did not attend evening group.   A: Medications administered per order.  Support and encouragement offered.  Safety maintained.  PRN medication administered for insomnia, see flowsheet.  Pt was informed that length of stay varies based on progress in treatment and that patients are generally not here for a prolonged period of time because this is an acute unit.   R: Pt attempted to cheek medications this evening.  When a mouth check was done, pt still had tabs of medication in his mouth.  Pt then grunted and took medication.  Another mouth check was done and no tabs were in pt's mouth.  Pt is in no distress and verbally contracts for safety.  Will continue to monitor and assess for safety.

## 2014-07-27 DIAGNOSIS — F200 Paranoid schizophrenia: Principal | ICD-10-CM

## 2014-07-27 NOTE — [facility] Group Notes (Signed)
[facility] Group Notes:  (Nursing/MHT/Case Management/Adjunct)  Date:  07/27/2014  Time:  3:00 PM  Type of Therapy:  Psychoeducational Skills-Pt self inventory group.   Participation Level:  Did Not Attend  Jacquelyne BalintForrest, Jachelle Fluty [facility] 07/27/2014, 3:00 PM

## 2014-07-27 NOTE — [facility] Group Notes (Signed)
[facility] LCSW Group Therapy  07/27/2014 3:09 PM  Type of Therapy:  Group Therapy  Participation Level:  Did Not Attend  Participation Quality:  N/A  Affect:  N/A  Cognitive:  N/A  Insight:  N/A  Engagement in Therapy:  N/A  Modes of Intervention:  Discussion . Summary of Progress/Problems: Pt did not attend  Seabron SpatesVaughn, Christianjames Soule Anne 07/27/2014, 3:09 PM

## 2014-07-27 NOTE — Plan of Care (Signed)
Problem: Alteration in mood & ability to function due to Goal: LTG-Pt reports reduction in suicidal thoughts (Patient reports reduction in suicidal thoughts and is able to verbalize a safety plan for whenever patient is feeling suicidal)  Outcome: Progressing Pt denied SI this shift.    Problem: Ineffective individual coping Goal: STG: Patient will remain free from self harm Outcome: Progressing Pt has remained free from self harm this shift.

## 2014-07-27 NOTE — Progress Notes (Signed)
Patient ID: Vincent [name], male   DOB: 04/25/1996, 18 y.o.   MRN: [id number] D. Patient presents with irritable mood, affect congruent again today. [name] continues to have poor hygiene, he is malodorous. [name] does show improvement with cognition as he is able to respond appropriately to questions today, However he continues to be disruptive at times to the milieu. Earlier in shift while peer was in dayroom upset he stated ''what's that bitch crying for?'' cursing towards peer. He was able to be verbally redirected. [name]  Did accept prn medication for agitation - with good results. A. Medications given as ordered. Support and encouragement provided. Discussed above information with Renata Capriceonrad NP. R. Patient is safe. Will continue to monitor q 15 minutes for safety.

## 2014-07-27 NOTE — Progress Notes (Signed)
[facility]  07/27/2014 11:38 AM Vincent PinaRodney D [name] [name]  MRN:  [id number] Subjective:  "I'm doing a lot better. I'm not hearing any voices, seeing anything and I don't want to hurt anyone or myself. These people think I'm crazy, but there is a difference between being pissed off and crazy. When I came in here, I was really pissed off and yeah I was talking a lot and cussing and everything, but you don't see me punching anybody or slamming anybody. The police just came into my house and tazed me because there were 4 of them and they did that when I tried to walk out. I don't even know why I'm here. My family is just hating on me for no reason. I didn't even do anything. I was just smoking a cigarette and then they called the police after I went to lay down in my room".  Objective: Pt seen and chart reviewed. Pt is calm, cooperative, alert/oriented x4, answers questions appropriately. Pt reports good sleep and appetite and denies SI, HI, and AVH. Pt does not appear to be responding to any internal stimuli at this time. Pt is pacing the halls and has been doing so, but this appears to be self-directed rather than based within confusion. Pt affirms full agreement to cooperate with staff and affirms understanding that intimidating behaviors toward staff may make him appear to be psychotic when he is not actually psychotic. Pt provides good insight and was apologetic about this behavior with a goal of improving and being patient while we evaluate him properly. Pt also states understanding that we are all operating under a legal order of IVC and that we all must participate to get through this together. Assessment completed without incident and pt was very open with this provider.   Diagnosis:  Mood swings DSM5: Schizophrenia Disorders:  Paranoid schizophrenia Obsessive-Compulsive Disorders:  NA Trauma-Stressor Disorders:  NA Substance/Addictive Disorders:  NA Depressive Disorders:  NA Total Time  spent with patient: 25 minutes  Axis I: Chronic Paranoid Schizophrenia  ADL's:  Intact  Sleep: Good, hypersomnia  Appetite:  Good  Suicidal Ideation:  Not endorsing/minimizing Homicidal Ideation:  Not endorsing  Psychiatric Specialty Exam: Physical Exam  Vitals reviewed. Psychiatric: Judgment and thought content normal. His affect is angry. He is withdrawn. Cognition and memory are normal. He is noncommunicative.    Review of Systems  Constitutional: Negative.   HENT: Negative.   Eyes: Negative.   Respiratory: Negative.   Cardiovascular: Negative.   Gastrointestinal: Negative.   Genitourinary: Negative.   Musculoskeletal: Negative.   Skin: Negative.   Neurological: Negative.   Endo/Heme/Allergies: Negative.   Psychiatric/Behavioral: Positive for depression. The patient is nervous/anxious.     Blood pressure 137/86, pulse 105, temperature 97.7 F (36.5 C), temperature source Oral, resp. rate 18, height 6' (1.829 m), weight 80.287 kg (177 lb), SpO2 100 %.Body mass index is 24 kg/(m^2).  General Appearance: Casual and Fairly Groomed  Patent attorneyye Contact::  Fair  Speech:  Clear and Coherent and Normal Rate  Volume:  Decreased  Mood:  Euthymic  Affect:  Flat  Thought Process:  Coherent and Goal Directed  Orientation:  Full (Time, Place, and Person)  Thought Content:  Rumination about the police, his mother, and his grandmother "taking out papers on me for no reason"  Suicidal Thoughts:  No  Homicidal Thoughts:  No  Memory:  NA  Judgement:  Poor  Insight:  Lacking  Psychomotor Activity:  Normal  Concentration:  Good  Recall:  Poor  Fund of Knowledge:Poor  Language: Poor  Akathisia:  Negative  Handed:  Right  AIMS (if indicated):     Assets:  Resilience  Sleep:  Number of Hours: 6.25   Musculoskeletal: Strength & Muscle Tone: within normal limits Gait & Station: normal Patient leans: N/A  Current Medications: Current Facility-Administered Medications  Medication  Dose Route Frequency Provider Last Rate Last Dose  . acetaminophen (TYLENOL) tablet 650 mg  650 mg Oral Q6H PRN Shuvon Rankin, NP      . alum & mag hydroxide-simeth (MAALOX/MYLANTA) 200-200-20 MG/5ML suspension 30 mL  30 mL Oral Q4H PRN Shuvon Rankin, NP      . haloperidol lactate (HALDOL) injection 5 mg  5 mg Intramuscular Once Beau FannyJohn C Withrow, FNP       And  . LORazepam (ATIVAN) injection 2 mg  2 mg Intramuscular Once Beau FannyJohn C Withrow, FNP      . LORazepam (ATIVAN) tablet 1 mg  1 mg Oral Q4H PRN Rockey SituFernando A Cobos, MD      . magnesium hydroxide (MILK OF MAGNESIA) suspension 30 mL  30 mL Oral Daily PRN Shuvon Rankin, NP      . nicotine (NICODERM CQ - dosed in mg/24 hours) patch 14 mg  14 mg Transdermal Daily Saramma Eappen, MD   14 mg at 07/27/14 1112  . risperiDONE (RISPERDAL M-TABS) disintegrating tablet 2 mg  2 mg Oral Q8H PRN Craige CottaFernando A Cobos, MD   2 mg at 07/27/14 1110  . risperiDONE (RISPERDAL) tablet 2 mg  2 mg Oral QHS Shuvon Rankin, NP   2 mg at 07/26/14 2100  . traZODone (DESYREL) tablet 50 mg  50 mg Oral QHS PRN Shuvon Rankin, NP   50 mg at 07/26/14 2100    Lab Results: No results found for this or any previous visit (from the past 48 hour(s)).  Physical Findings: AIMS: Facial and Oral Movements Muscles of Facial Expression: None, normal Lips and Perioral Area: None, normal Jaw: None, normal Tongue: None, normal,Extremity Movements Upper (arms, wrists, hands, fingers): None, normal Lower (legs, knees, ankles, toes): None, normal, Trunk Movements Neck, shoulders, hips: None, normal, Overall Severity Severity of abnormal movements (highest score from questions above): None, normal Incapacitation due to abnormal movements: None, normal Patient's awareness of abnormal movements (rate only patient's report): No Awareness, Dental Status Current problems with teeth and/or dentures?: No Does patient usually wear dentures?: No  CIWA:    COWS:     Treatment Plan Summary: Daily contact  with patient to assess and evaluate symptoms and progress in treatment Medication management  Plan: Review of chart, vital signs, medications, and notes.  1-Individual and group therapy  2-Medication management for depression and anxiety: Medications reviewed with the patient and she stated no untoward effects, unchanged.  3-Coping skills for depression, anxiety  4-Continue crisis stabilization and management  5-Address health issues--monitoring vital signs, stable  6-Treatment plan in progress to prevent relapse of depression and anxiety  Medical Decision Making Problem Points:  Established problem, stable/improving (1) Data Points:  Review of medication regiment & side effects (2)  I certify that inpatient services furnished can reasonably be expected to improve the patient's condition.   Beau FannyWithrow, John C, FNP-BC 07/27/2014, 11:38 AM I agree with assessment and plan Madie Renorving A. Dub MikesLugo, M.D.

## 2014-07-27 NOTE — [facility] Group Notes (Signed)
[facility] Group Notes:  (Nursing/MHT/Case Management/Adjunct)  Date:  07/27/2014  Time:  3:04 PM  Type of Therapy:  Psychoeducational Skills-Healthy Support Systems.   Participation Level:  Did Not Attend Sheree Lalla Shanta 07/27/2014, 3:04 PM 

## 2014-07-28 DIAGNOSIS — F1994 Other psychoactive substance use, unspecified with psychoactive substance-induced mood disorder: Secondary | ICD-10-CM

## 2014-07-28 DIAGNOSIS — F19951 Other psychoactive substance use, unspecified with psychoactive substance-induced psychotic disorder with hallucinations: Secondary | ICD-10-CM

## 2014-07-28 DIAGNOSIS — F1290 Cannabis use, unspecified, uncomplicated: Secondary | ICD-10-CM

## 2014-07-28 MED ORDER — OLANZAPINE 10 MG PO TBDP: 10.0000 mg | ORAL_TABLET | Freq: Three times a day (TID) | ORAL | Status: DC | PRN

## 2014-07-28 MED ORDER — BENZTROPINE MESYLATE 0.5 MG PO TABS
0.5000 mg | ORAL_TABLET | Freq: Every day | ORAL | Status: DC
  Administered 2014-07-28: 0.5 mg via ORAL
  Filled 2014-07-28 (×3): qty 1
  Filled 2014-07-28: qty 3

## 2014-07-28 MED ORDER — DIVALPROEX SODIUM 250 MG PO DR TAB
250.0000 mg | DELAYED_RELEASE_TABLET | Freq: Two times a day (BID) | ORAL | Status: DC
  Administered 2014-07-28 (×2): 250 mg via ORAL
  Filled 2014-07-28: qty 6
  Filled 2014-07-28 (×4): qty 1
  Filled 2014-07-28: qty 6
  Filled 2014-07-28 (×2): qty 1

## 2014-07-28 MED ORDER — RISPERIDONE 0.5 MG PO TABS
2.5000 mg | ORAL_TABLET | Freq: Every day | ORAL | Status: DC
  Administered 2014-07-28: 2.5 mg via ORAL
  Filled 2014-07-28 (×3): qty 1

## 2014-07-28 NOTE — Progress Notes (Addendum)
[facility]  07/28/2014 2:56 PM Vincent [name]  MRN:  [id number] Subjective:  Pt states, 'you are sexy ", making inappropriate gestures with his lips and smile inappropriately.  Objective: Pt seen and chart reviewed. Pt is anxious ,seen pacing on the hallway several times ,needs a lot of redirection. Pt denies any AH today ,but seems to have irritability ,has been disruptive on the unit ,has altercation with other patients. Pt has no insight in to his substance use do , and is fixed on discharge. Pt has required prn medications today since he has been very hyperactive as well as aggressive on the unit. Per staff ,pt requires a lot of redirection.   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 Psychiatric: Thought content normal. His affect is labile. His speech is tangential. He is hyperactive. Cognition and memory are normal. He expresses impulsivity. He is inattentive.    Review of Systems  Constitutional: Negative.   HENT: Negative.   Eyes: Negative.   Respiratory: Negative.   Cardiovascular: Negative.   Gastrointestinal: Negative.   Genitourinary: Negative.   Musculoskeletal: Negative.   Skin: Negative.   Neurological: Negative.   Endo/Heme/Allergies: Negative.   Psychiatric/Behavioral: Positive for substance abuse. The patient is nervous/anxious.     Blood pressure 137/86, pulse 105, temperature 97.7 F (36.5 C), temperature source Oral, resp. rate 18, height 6'  (1.829 m), weight 80.287 kg (177 lb), SpO2 100 %.Body mass index is 24 kg/(m^2).  General Appearance: Fairly Groomed  Patent attorneyye Contact::  Fair  Speech:  Normal Rate  Volume:  Decreased  Mood:  Anxious and Irritable  Affect:  Labile  Thought Process:  Disorganized, Loose and Tangential  Orientation:  Full (Time, Place, and Person)  Thought Content:  Rumination and and is hypersexual,makes inappropriate gestures with his lips and make inappropriate sexual comments to staff as well as Clinical research associatewriter   Suicidal Thoughts:  No  Homicidal Thoughts:  No  Memory:  Immediate;   grossly intact Recent;   grossly intact Remote;   grossly intact  Judgement:  Poor  Insight:  Lacking  Psychomotor Activity:  Increased  Concentration:  Poor  Recall:  Poor  Fund of Knowledge:Fair  Language: Fair  Akathisia:  Negative  Handed:  Right  AIMS (if indicated):     Assets:  Physical Health  Sleep:  Number of Hours: 6.25   Musculoskeletal: Strength & Muscle Tone: within normal limits Gait & Station: normal Patient leans: N/A  Current Medications: Current Facility-Administered Medications  Medication Dose Route Frequency Provider Last Rate Last Dose  . acetaminophen (TYLENOL) tablet 650 mg  650 mg Oral Q6H PRN Shuvon Rankin, NP      . alum & mag hydroxide-simeth (MAALOX/MYLANTA) 200-200-20 MG/5ML suspension 30 mL  30 mL Oral Q4H PRN Shuvon Rankin, NP      . benztropine (COGENTIN) tablet 0.5 mg  0.5 mg Oral QHS Idrees Quam, MD      . divalproex (DEPAKOTE) DR tablet 250 mg  250 mg Oral Q12H Courtney Fenlon, MD   250 mg at  07/28/14 1125  . haloperidol lactate (HALDOL) injection 5 mg  5 mg Intramuscular Once Beau FannyJohn C Withrow, FNP       And  . LORazepam (ATIVAN) injection 2 mg  2 mg Intramuscular Once Beau FannyJohn C Withrow, FNP      . LORazepam (ATIVAN) tablet 1 mg  1 mg Oral Q4H PRN Craige CottaFernando A Cobos, MD   1 mg at 07/28/14 0936  . magnesium hydroxide (MILK OF MAGNESIA) suspension 30 mL  30 mL Oral Daily PRN Shuvon Rankin, NP       . nicotine (NICODERM CQ - dosed in mg/24 hours) patch 14 mg  14 mg Transdermal Daily Madeline Pho, MD   14 mg at 07/27/14 1112  . OLANZapine zydis (ZYPREXA) disintegrating tablet 10 mg  10 mg Oral Q8H PRN Kentrail Shew, MD      . risperiDONE (RISPERDAL) tablet 2.5 mg  2.5 mg Oral QHS Cheyanna Strick, MD      . traZODone (DESYREL) tablet 50 mg  50 mg Oral QHS PRN Shuvon Rankin, NP   50 mg at 07/27/14 2135    Lab Results: No results found for this or any previous visit (from the past 48 hour(s)).  Physical Findings: AIMS: Facial and Oral Movements Muscles of Facial Expression: None, normal Lips and Perioral Area: None, normal Jaw: None, normal Tongue: None, normal,Extremity Movements Upper (arms, wrists, hands, fingers): None, normal Lower (legs, knees, ankles, toes): None, normal, Trunk Movements Neck, shoulders, hips: None, normal, Overall Severity Severity of abnormal movements (highest score from questions above): None, normal Incapacitation due to abnormal movements: None, normal Patient's awareness of abnormal movements (rate only patient's report): No Awareness, Dental Status Current problems with teeth and/or dentures?: No Does patient usually wear dentures?: No  CIWA:    COWS:     Treatment Plan Summary: Daily contact with patient to assess and evaluate symptoms and progress in treatment Medication management  Assessment /plan: Pt is a 18 year old AAM ,with hx of cannabis abuse as well as nyquil abuse and psychosis as well as mood sx secondary to his substance abuse, presents with psychosis. Pt has been very anxious ,hyperactive ,inappropriate ,making sexual statements to staff on the unit.Pt also has been having altercation with other patients on the unit. Review of chart, vital signs, medications, and notes.  Will start a trial of Depakote DR 250 mg po bid for mood sx as well as agitation. Will continue Risperdal ,but will increase to 2.5 mg po qhs for psychosis. Pt  denies any today ,but the increased does could also help with mood lability. Will continue Cogentin for EPS. Will add Zyprexa zydis 10 mg q8h prn for agitation as well as severe anxiety sx. Will continue Trazodone 50 mg po qhs for sleep. Will also get TSH,lipid panel,Hb a1c as well as EKG . Pt to continue attending groups activities. CSW will work on disposition. Pt to be referred to a substance abuse program once stable.      Medical Decision Making Problem Points:  Established problem, stable/improving (1), Review of last therapy session (1) and Review of psycho-social stressors (1) Data Points:  Review and summation of old records (2) Review of medication regiment & side effects (2) Review of new medications or change in dosage (2)  I certify that inpatient services furnished can reasonably be expected to improve the patient's condition.   Kristia Jupiter, MD 07/28/2014, 2:56 PM

## 2014-07-28 NOTE — [facility] Group Notes (Signed)
[facility]   07/28/2014 10:17 AM  Participation Quality:  Invited-did not attend-pt sleeping in room   Smart, Vincent [name] LCSWA

## 2014-07-28 NOTE — Progress Notes (Signed)
Psychoeducational Group Note  Date:  07/28/2014 Time:  0105  Group Topic/Focus:  Wrap-Up Group:   The focus of this group is to help patients review their daily goal of treatment and discuss progress on daily workbooks.  Participation Level: Did Not Attend  Participation Quality:  Not Applicable  Affect:  Not Applicable  Cognitive:  Not Applicable  Insight:  Not Applicable  Engagement in Group: Not Applicable  Additional Comments:  The patient did not attend group last evening.   Hertha Gergen S 07/28/2014, 1:05 AM

## 2014-07-28 NOTE — [facility] Group Notes (Signed)
[facility] LCSW Group Therapy  07/28/2014 1:53 PM  Type of Therapy:  Group Therapy  Participation Level: Invited- Did Not Attend  Summary of Progress/Problems: Today's Topic: Overcoming Obstacles. Group members identified obstacles faced currently and processed barriers involved in overcoming these obstacles. Group members identified steps necessary for overcoming these obstacles and explored motivation (internal and external) for facing these difficulties head on. Group members further identified one area of concern in their lives and chose a skill of focus pulled from their "toolbox."   Smart, East AllianceHeather LCSWA 07/28/2014, 1:53 PM

## 2014-07-28 NOTE — Progress Notes (Signed)
The focus of this group is to help patients review their daily goal of treatment and discuss progress on daily workbooks. Pt attended the last ten minutes of the evening group session but responded minimally to discussion prompts from the Writer. Pt reported having had "a chill day. I just drank coffee and ate food. Nothing else to do here." Pt said that upon discharge he planned to keep well by staying away from people and their drama. Pt's affect was flat and he did not appear engaged in the discussion.

## 2014-07-28 NOTE — Progress Notes (Signed)
Pt came out of room yelling "you cut my hair!  Did you cut my hair?"  Pt thought peer cut his hair in the middle of the night.  Peer has been on 1:1.  Pt tried to go into peer's room and was blocked by staff.  Pt was informed that nobody cut his hair.  Pt was looking in mirror saying "he cut my hair.  He cut my hair."  Pt refused PRN medication stating "I'm going to bed.  I don't need any medication."  Pt then asked "are you sure he didn't cut my hair?  Nobody came in my room and cut my hair?"  Pt informed that he has been the only person in his room.  Pt stated "okay then.  I'm going to bed.  I have no point."  Pt informed that if he becomes aggressive, he will be getting medication so that he can regain control of his behavior.  Staff was asked to stay at end of hallway near pt's room in between checks.  Will continue to monitor and assess for safety.

## 2014-07-28 NOTE — Progress Notes (Signed)
D: Pt has blunted affect and labile, irritable mood.  Pt reports his goal for the day was "to get out, leave, nobody told me when I'm leaving.  Same as yesterday."  Pt denies SI/HI, denies hallucinations.  Pt did not attend evening group.  Pt paced the hallway periodically.   A: Met with pt 1:1 and offered support and encouragement.  Safety maintained.  Medications administered per order and mouth check performed after medication administration.  PRN medication administered for insomnia, see flowsheet.   R: Pt is in no distress and verbally contracts for safety.  He has been compliant with scheduled medications.  Will continue to monitor and assess for safety.

## 2014-07-28 NOTE — Plan of Care (Signed)
Problem: Alteration in thought process Goal: STG-Patient is able to follow short directions Outcome: Progressing Pt has been able to follow short directions this shift.       

## 2014-07-29 DIAGNOSIS — F1994 Other psychoactive substance use, unspecified with psychoactive substance-induced mood disorder: Secondary | ICD-10-CM | POA: Insufficient documentation

## 2014-07-29 DIAGNOSIS — F19929 Other psychoactive substance use, unspecified with intoxication, unspecified: Secondary | ICD-10-CM | POA: Insufficient documentation

## 2014-07-29 DIAGNOSIS — F19959 Other psychoactive substance use, unspecified with psychoactive substance-induced psychotic disorder, unspecified: Secondary | ICD-10-CM

## 2014-07-29 DIAGNOSIS — F1220 Cannabis dependence, uncomplicated: Secondary | ICD-10-CM | POA: Insufficient documentation

## 2014-07-29 MED ORDER — RISPERIDONE 0.5 MG PO TABS: 2.5000 mg | ORAL_TABLET | Freq: Every day | ORAL | Status: DC

## 2014-07-29 MED ORDER — DIPHENHYDRAMINE HCL 25 MG PO CAPS
ORAL_CAPSULE | ORAL | Status: AC
  Filled 2014-07-29: qty 2

## 2014-07-29 MED ORDER — LORAZEPAM 2 MG/ML IJ SOLN: 2.0000 mg | Freq: Once | INTRAMUSCULAR | Status: DC

## 2014-07-29 MED ORDER — LORAZEPAM 1 MG PO TABS
ORAL_TABLET | ORAL | Status: AC
  Filled 2014-07-29: qty 2

## 2014-07-29 MED ORDER — DIPHENHYDRAMINE HCL 25 MG PO CAPS
25.0000 mg | ORAL_CAPSULE | Freq: Once | ORAL | Status: DC
  Filled 2014-07-29: qty 1

## 2014-07-29 MED ORDER — DIPHENHYDRAMINE HCL 50 MG/ML IJ SOLN
INTRAMUSCULAR | Status: AC
  Filled 2014-07-29: qty 1

## 2014-07-29 MED ORDER — BENZTROPINE MESYLATE 0.5 MG PO TABS: 0.5000 mg | ORAL_TABLET | Freq: Every day | ORAL | Status: DC

## 2014-07-29 MED ORDER — TRAZODONE HCL 50 MG PO TABS: 50.0000 mg | ORAL_TABLET | Freq: Every evening | ORAL | Status: DC | PRN

## 2014-07-29 MED ORDER — LORAZEPAM 2 MG/ML IJ SOLN
INTRAMUSCULAR | Status: AC
  Filled 2014-07-29: qty 1

## 2014-07-29 MED ORDER — HALOPERIDOL LACTATE 5 MG/ML IJ SOLN
INTRAMUSCULAR | Status: AC
  Filled 2014-07-29: qty 1

## 2014-07-29 MED ORDER — HALOPERIDOL 5 MG PO TABS
5.0000 mg | ORAL_TABLET | Freq: Once | ORAL | Status: DC
  Filled 2014-07-29: qty 1

## 2014-07-29 MED ORDER — HALOPERIDOL LACTATE 5 MG/ML IJ SOLN
5.0000 mg | Freq: Once | INTRAMUSCULAR | Status: DC
  Filled 2014-07-29: qty 1

## 2014-07-29 MED ORDER — DIPHENHYDRAMINE HCL 50 MG/ML IJ SOLN
25.0000 mg | Freq: Once | INTRAMUSCULAR | Status: DC
  Filled 2014-07-29: qty 0.5

## 2014-07-29 MED ORDER — HALOPERIDOL 5 MG PO TABS
ORAL_TABLET | ORAL | Status: AC
  Filled 2014-07-29: qty 1

## 2014-07-29 MED ORDER — RISPERIDONE 0.5 MG PO TABS
2.5000 mg | ORAL_TABLET | Freq: Every day | ORAL | Status: DC
  Filled 2014-07-29: qty 15

## 2014-07-29 MED ORDER — DIVALPROEX SODIUM 250 MG PO DR TAB: 250.0000 mg | DELAYED_RELEASE_TABLET | Freq: Two times a day (BID) | ORAL | Status: DC

## 2014-07-29 MED ORDER — LORAZEPAM 1 MG PO TABS: 2.0000 mg | ORAL_TABLET | Freq: Once | ORAL | Status: DC

## 2014-07-29 NOTE — Plan of Care (Signed)
Problem: Alteration in mood & ability to function due to Goal: STG-Patient will comply with prescribed medication regimen (Patient will comply with prescribed medication regimen)  Outcome: Progressing Pt complainant with medication regimen
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
## 2014-07-29 NOTE — Progress Notes (Signed)
[facility] :  Will you be returning to the same living situation after discharge: Yes,  with friend At discharge, do you have transportation home?:Yes,  bus pass Do you have the ability to pay for your medications:Yes,  BCBS private insurance  Release of information consent forms completed and submitted to Medical Records by CSW.  Patient to Follow up at: Patient Refusing all follow-up.   Patient denies SI/HI:   Yes,  during self report    Safety Planning and Suicide Prevention discussed:  Yes,  SPE completed with pt's mother. SPI pamphlet provided to pt and he was encouraged to share information with support network, ask questions, and talk about any concerns relating to SPE.  Smart, Vincent [name] LCSWA  07/29/2014, 10:22 AM

## 2014-07-29 NOTE — [facility] Group Notes (Signed)
[facility] LCSW Group Therapy  07/29/2014 12:33 PM  Type of Therapy:  Group Therapy  Participation Level:  Did Not Attend  Participation Quality:  na  Affect:  na  Cognitive:  na  Insight:  na  Engagement in Therapy:  na  Modes of Intervention:  Discussion, Education and Exploration  Summary of Progress/Problems:  Emotion Regulation: This group focused on both positive and negative emotion identification and allowed group members to process ways to identify feelings, regulate negative emotions, and find healthy ways to manage internal/external emotions. Group members were asked to reflect on a time when their reaction to an emotion led to a negative outcome and explored how alternative responses using emotion regulation would have benefited them. Group members were also asked to discuss a time when emotion regulation was utilized when a negative emotion was experienced.  Patient invited but did not attend group.    Sallee LangeCunningham, Lawyer Washabaugh C 07/29/2014, 12:33 PM
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
## 2014-07-29 NOTE — Progress Notes (Signed)
Patient states he is ready for discharge as our "piss ass services" have not helped him. Continues to pace hall and curse others. Did allow this RN to review discharge paperwork with him and verbalized understanding. Rx's given and belongings returned. Patient states he will walk to friend's house which was confirmed with Herbert SetaHeather, LCSW. Pt denies SI/HI and is discharged in stable condition. Lawrence MarseillesFriedman, Djimon Lundstrom Eakes

## 2014-07-29 NOTE — [facility] Suicide Risk Assessment (Signed)
[facility] INPATIENT:  Family/Significant Other Suicide Prevention Education  Suicide Prevention Education:  Education Completed; Karis Jubaakisha Mills (pt's mother) [phone] has been identified by the patient as the family member/significant other with whom the patient will be residing, and identified as the person(s) who will aid the patient in the event of a mental health crisis (suicidal ideations/suicide attempt).  With written consent from the patient, the family member/significant other has been provided the following suicide prevention education, prior to the and/or following the discharge of the patient.  The suicide prevention education provided includes the following:  Suicide risk factors  Suicide prevention and interventions  National Suicide Hotline telephone number  [facility] assessment telephone number  [facility] Emergency Assistance 911  [facility] and/or Residential Mobile Crisis Unit telephone number  Request made of family/significant other to:  Remove weapons (e.g., guns, rifles, knives), all items previously/currently identified as safety concern.    Remove drugs/medications (over-the-counter, prescriptions, illicit drugs), all items previously/currently identified as a safety concern.  The family member/significant other verbalizes understanding of the suicide prevention education information provided.  The family member/significant other agrees to remove the items of safety concern listed above.  Smart, Nesiah Jump LCSWA 07/29/2014, 10:22 AM

## 2014-07-29 NOTE — Plan of Care (Signed)
Problem: Ineffective individual coping Goal: STG:Pt. will utilize relaxation techniques to reduce stress STG: Patient will utilize relaxation techniques to reduce stress levels  Outcome: Not Progressing Patient refusing to use appropriate coping skills and will not take meds.  Problem: Alteration in thought process Goal: LTG-Patient verbalizes understanding importance med regimen (Patient verbalizes understanding of importance of medication regimen and need to continue outpatient care.)  Outcome: Not Progressing Patient refusing all meds

## 2014-07-29 NOTE — [facility] Group Notes (Signed)
The focus of this group is to educate the patient on the purpose and policies of crisis stabilization and provide a format to answer questions about their admission.  The group details unit policies and expectations of patients while admitted. Patient did not attend this group. 

## 2014-07-29 NOTE — Clinical Social Work Note (Signed)
Pt consented to allowing CSW to contact his mother at [phone] Karis Juba(Nakisha Mills). CSW spoke with pt's mother at length and informed her that [name] will be d/cing today due to behavioral issues on the unit. Pt's mother is not allowing pt to come to her home. Pt plans to stay with a friend or other family. Pt refusing all follow-up. CSW informed pt's mother that we will be making a crisis plan for [place]-if he returns to ED, a [facility] referral must be made, as [facility] (per Everlene BallsShawn Taylor) due to behavioral issues/physical and verbal aggression toward other patients and staff. Pt's mother verbalized understanding of above information and stated that she does not have a problem with calling police if her son becomes a danger to himself or others in order to get him to the ED/[facility].   The Sherwin-WilliamsHeather Smart, LCSWA 07/29/2014 10:20 AM
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
## 2014-07-30 ENCOUNTER — Emergency Department (HOSPITAL_COMMUNITY)
Admission: EM | Admit: 2014-07-30 | Discharge: 2014-07-30 | Disposition: A | Discharge status: Home / Self Care | Payer: BC Managed Care – PPO | Attending: Emergency Medicine | Admitting: Emergency Medicine

## 2014-07-30 ENCOUNTER — Emergency Department (HOSPITAL_COMMUNITY): Payer: BC Managed Care – PPO

## 2014-07-30 ENCOUNTER — Encounter (HOSPITAL_COMMUNITY): Payer: Self-pay | Admitting: Emergency Medicine

## 2014-07-30 DIAGNOSIS — Y998 Other external cause status: Secondary | ICD-10-CM | POA: Diagnosis not present

## 2014-07-30 DIAGNOSIS — W108XXA Fall (on) (from) other stairs and steps, initial encounter: Secondary | ICD-10-CM | POA: Insufficient documentation

## 2014-07-30 DIAGNOSIS — Y9389 Activity, other specified: Secondary | ICD-10-CM | POA: Insufficient documentation

## 2014-07-30 DIAGNOSIS — S79911A Unspecified injury of right hip, initial encounter: Secondary | ICD-10-CM | POA: Insufficient documentation

## 2014-07-30 DIAGNOSIS — S4992XA Unspecified injury of left shoulder and upper arm, initial encounter: Secondary | ICD-10-CM | POA: Diagnosis present

## 2014-07-30 DIAGNOSIS — Y9289 Other specified places as the place of occurrence of the external cause: Secondary | ICD-10-CM | POA: Insufficient documentation

## 2014-07-30 DIAGNOSIS — F419 Anxiety disorder, unspecified: Secondary | ICD-10-CM | POA: Diagnosis not present

## 2014-07-30 DIAGNOSIS — M25551 Pain in right hip: Secondary | ICD-10-CM

## 2014-07-30 DIAGNOSIS — Z720 Tobacco use: Secondary | ICD-10-CM | POA: Insufficient documentation

## 2014-07-30 DIAGNOSIS — S022XXA Fracture of nasal bones, initial encounter for closed fracture: Secondary | ICD-10-CM | POA: Diagnosis not present

## 2014-07-30 DIAGNOSIS — T1490XA Injury, unspecified, initial encounter: Secondary | ICD-10-CM

## 2014-07-30 DIAGNOSIS — M25512 Pain in left shoulder: Secondary | ICD-10-CM

## 2014-07-30 DIAGNOSIS — Z79899 Other long term (current) drug therapy: Secondary | ICD-10-CM | POA: Insufficient documentation

## 2014-07-30 DIAGNOSIS — W19XXXA Unspecified fall, initial encounter: Secondary | ICD-10-CM

## 2014-07-30 MED ORDER — TETRACAINE HCL 0.5 % OP SOLN
2.0000 [drp] | Freq: Once | OPHTHALMIC | Status: AC
  Administered 2014-07-30: 2 [drp] via OPHTHALMIC
  Filled 2014-07-30: qty 2

## 2014-07-30 MED ORDER — HYDROCODONE-ACETAMINOPHEN 5-325 MG PO TABS
2.0000 | ORAL_TABLET | Freq: Once | ORAL | Status: AC
  Administered 2014-07-30: 2 via ORAL
  Filled 2014-07-30: qty 2

## 2014-07-30 MED ORDER — FLUORESCEIN SODIUM 1 MG OP STRP
1.0000 | ORAL_STRIP | Freq: Once | OPHTHALMIC | Status: AC
  Administered 2014-07-30: 1 via OPHTHALMIC
  Filled 2014-07-30: qty 1

## 2014-07-30 NOTE — ED Notes (Addendum)
Pt's male visitor asked to speak w/ this Charge RN about the Pt's chart being "flagged."  Sts they couldn't understand why he was being discharged, since the chart was "flagged."  This Charge RN asked the visitor to have a seat in the room and I would follow up.  A chart review was done and noted that the "flag" was stating that should the Pt return for a Psych complaint, then he cannot [facility].  This RN spoke to Terrace ParkHeather PA concerning the matter.  She had spoken to the Psych team and sts that she is going to d/c the patient and does not have criteria to IVC him.  Pt denies SI/HI/hallucinations.  This Consulting civil engineerCharge RN spoke w/ two male visitors regarding their concerns.  They were informed that the "flag" was not regarding any reason that he needed to return, the Pt was being discharged because he was medically cleared, and he did not meet criteria to be held for IVC.  Additionally, the visitors were informed that if they feel the Pt is a harm to himself or others, then they should take out IVC paperwork.  One visitor stated that an IVC was taken out on Saturday.  This Consulting civil engineerCharge RN informed her that they are only good for 24hrs and would have to be continued by a physician.  Also, since [facility], it would have been rescinded.  Visitors verbalized understanding.
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
## 2014-08-01 ENCOUNTER — Encounter (HOSPITAL_COMMUNITY): Payer: Self-pay | Admitting: Emergency Medicine

## 2014-08-01 ENCOUNTER — Emergency Department (HOSPITAL_COMMUNITY)
Admission: EM | Admit: 2014-08-01 | Discharge: 2014-08-02 | Disposition: A | Discharge status: Home / Self Care | Payer: BC Managed Care – PPO | Attending: Emergency Medicine | Admitting: Emergency Medicine

## 2014-08-01 DIAGNOSIS — Z720 Tobacco use: Secondary | ICD-10-CM | POA: Insufficient documentation

## 2014-08-01 DIAGNOSIS — F39 Unspecified mood [affective] disorder: Secondary | ICD-10-CM | POA: Insufficient documentation

## 2014-08-01 DIAGNOSIS — F912 Conduct disorder, adolescent-onset type: Secondary | ICD-10-CM | POA: Diagnosis not present

## 2014-08-01 DIAGNOSIS — F419 Anxiety disorder, unspecified: Secondary | ICD-10-CM | POA: Insufficient documentation

## 2014-08-01 DIAGNOSIS — F1210 Cannabis abuse, uncomplicated: Secondary | ICD-10-CM | POA: Diagnosis not present

## 2014-08-01 DIAGNOSIS — F1220 Cannabis dependence, uncomplicated: Secondary | ICD-10-CM | POA: Diagnosis present

## 2014-08-01 DIAGNOSIS — Z79899 Other long term (current) drug therapy: Secondary | ICD-10-CM | POA: Diagnosis not present

## 2014-08-01 DIAGNOSIS — Z046 Encounter for general psychiatric examination, requested by authority: Secondary | ICD-10-CM | POA: Diagnosis present

## 2014-08-01 DIAGNOSIS — F4324 Adjustment disorder with disturbance of conduct: Secondary | ICD-10-CM | POA: Diagnosis present

## 2014-08-01 LAB — COMPREHENSIVE METABOLIC PANEL
ALK PHOS: 69 U/L (ref 39–117)
ALT: 31 U/L (ref 0–53)
AST: 37 U/L (ref 0–37)
Albumin: 3.8 g/dL (ref 3.5–5.2)
Anion gap: 9 (ref 5–15)
BUN: 9 mg/dL (ref 6–23)
CO2: 30 mEq/L (ref 19–32)
Calcium: 9.6 mg/dL (ref 8.4–10.5)
Chloride: 103 mEq/L (ref 96–112)
Creatinine, Ser: 0.88 mg/dL (ref 0.50–1.35)
GFR calc non Af Amer: >90 mL/min (ref >90)
GLUCOSE: 92 mg/dL (ref 70–99)
POTASSIUM: 4.3 meq/L (ref 3.7–5.3)
Sodium: 142 mEq/L (ref 137–147)
TOTAL PROTEIN: 7.1 g/dL (ref 6.0–8.3)
Total Bilirubin: 0.6 mg/dL (ref 0.3–1.2)

## 2014-08-01 LAB — CBC
HCT: 43.9 % (ref 39.0–52.0)
HEMOGLOBIN: 14.7 g/dL (ref 13.0–17.0)
MCH: 29.6 pg (ref 26.0–34.0)
MCHC: 33.5 g/dL (ref 30.0–36.0)
MCV: 88.5 fL (ref 78.0–100.0)
PLATELETS: 224 10*3/uL (ref 150–400)
RBC: 4.96 MIL/uL (ref 4.22–5.81)
RDW: 13 % (ref 11.5–15.5)
WBC: 6.8 10*3/uL (ref 4.0–10.5)

## 2014-08-01 LAB — ACETAMINOPHEN LEVEL

## 2014-08-01 LAB — RAPID URINE DRUG SCREEN, HOSP PERFORMED
AMPHETAMINES: NOT DETECTED
BENZODIAZEPINES: NOT DETECTED
Barbiturates: NOT DETECTED
Cocaine: NOT DETECTED
OPIATES: NOT DETECTED
TETRAHYDROCANNABINOL: POSITIVE — AB

## 2014-08-01 LAB — ETHANOL: Alcohol, Ethyl (B): <11 mg/dL (ref 0–11)

## 2014-08-01 LAB — SALICYLATE LEVEL: Salicylate Lvl: <2 mg/dL — ABNORMAL LOW (ref 2.8–20.0)

## 2014-08-01 MED ORDER — ALUM & MAG HYDROXIDE-SIMETH 200-200-20 MG/5ML PO SUSP: 30.0000 mL | ORAL | Status: DC | PRN

## 2014-08-01 MED ORDER — ZOLPIDEM TARTRATE 5 MG PO TABS: 5.0000 mg | ORAL_TABLET | Freq: Every evening | ORAL | Status: DC | PRN

## 2014-08-01 MED ORDER — DIVALPROEX SODIUM 250 MG PO DR TAB
250.0000 mg | DELAYED_RELEASE_TABLET | Freq: Two times a day (BID) | ORAL | Status: DC
  Administered 2014-08-01 – 2014-08-02 (×3): 250 mg via ORAL
  Filled 2014-08-01 (×3): qty 1

## 2014-08-01 MED ORDER — ACETAMINOPHEN 325 MG PO TABS: 650.0000 mg | ORAL_TABLET | ORAL | Status: DC | PRN

## 2014-08-01 MED ORDER — TRAZODONE HCL 50 MG PO TABS
50.0000 mg | ORAL_TABLET | Freq: Every evening | ORAL | Status: DC | PRN
Start: 2014-08-01 — End: 2014-08-02

## 2014-08-01 MED ORDER — ONDANSETRON HCL 4 MG PO TABS: 4.0000 mg | ORAL_TABLET | Freq: Three times a day (TID) | ORAL | Status: DC | PRN

## 2014-08-01 MED ORDER — LORAZEPAM 1 MG PO TABS: 1.0000 mg | ORAL_TABLET | Freq: Three times a day (TID) | ORAL | Status: DC | PRN

## 2014-08-01 MED ORDER — BENZTROPINE MESYLATE 1 MG PO TABS
0.5000 mg | ORAL_TABLET | Freq: Every day | ORAL | Status: DC
  Administered 2014-08-01: 0.5 mg via ORAL
  Filled 2014-08-01: qty 1

## 2014-08-01 MED ORDER — IBUPROFEN 200 MG PO TABS: 600.0000 mg | ORAL_TABLET | Freq: Three times a day (TID) | ORAL | Status: DC | PRN

## 2014-08-01 MED ORDER — RISPERIDONE 2 MG PO TABS
2.5000 mg | ORAL_TABLET | Freq: Every day | ORAL | Status: DC
  Administered 2014-08-01: 2.5 mg via ORAL
  Filled 2014-08-01 (×2): qty 1

## 2014-08-01 NOTE — Progress Notes (Signed)
Patient Discharge Instructions:  No documentation was faxed for HBIPS.  Per the SW the patient refused follow up.  Vincent ReddenSheena E [place], 08/01/2014, 3:59 PM

## 2014-08-01 NOTE — ED Notes (Signed)
Pt's "community mom", Vincent [name], can be reached at [phone].  She states that pt needs detox from drugs and believes that will resolve his mental/behavioral health.
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
## 2014-08-01 NOTE — ED Notes (Signed)
Report received from Caroline RN. Pt. Alert and oriented in no distress denies SI, HI, AVH and pain. Will continue to monitor for safety. Pt. Instructed to come to me with problems or concerns. Q 15 minute checks continue. 

## 2014-08-01 NOTE — Consult Note (Signed)
[facility] Consult   Reason for Consult:  Odd behaviors Referring Physician:  EDP  Vincent [name] is an 18 y.o. male. 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 Anxiety    AXIS IV:  other psychosocial or environmental problems, problems related to social environment and problems with primary support group AXIS V:  61-70 mild symptoms  Plan:  No evidence of imminent risk to self or others at present.    Subjective:   Vincent [name] is a 18 y.o. male patient does not warrant admission.  HPI:  The patient denied suicidal/homicidal ideations, hallucinations, and alcohol abuse.  He does smoke marijuana and cigarettes daily.  Vincent [name] states his God mother or the lady who wants to be part of his life but does not want her to be.  Upset about being IVC'd but calm and cooperative on assessment.  He does not want any treatment or detox. HPI Elements:   Location:  generalized. Quality:  acute. Severity:  mild. Timing:  intermittent. Duration:  brief. Context:  marijuana use.  Past Psychiatric History: Past Medical History  Diagnosis Date  . Anxiety     reports that he has been smoking.  He has never used smokeless tobacco. He reports that he uses illicit drugs (Marijuana). He reports that he does not drink alcohol. No family history on file.         Allergies:   Allergies  Allergen Reactions  . Shrimp [Shellfish Allergy] Anaphylaxis    ACT Assessment Complete:  Yes:    Educational Status    Risk to Self: Risk to self with the past 6 months Substance abuse history and/or treatment for substance abuse?: Yes  Risk to Others:    Abuse:    Prior Inpatient Therapy:    Prior Outpatient Therapy:    Additional Information:                    Objective: Blood pressure 128/59, pulse 68,  temperature 98.1 F (36.7 C), temperature source Oral, resp. rate 20, SpO2 100 %.There is no weight on file to calculate BMI. Results for orders placed or performed during the hospital encounter of 08/01/14 (from the past 72 hour(s))  Acetaminophen level     Status: None   Collection Time: 08/01/14 11:33 AM  Result Value Ref Range   Acetaminophen (Tylenol), Serum <15.0 10 - 30 ug/mL    Comment:        THERAPEUTIC CONCENTRATIONS VARY SIGNIFICANTLY. A RANGE OF 10-30 ug/mL MAY BE AN EFFECTIVE CONCENTRATION FOR MANY PATIENTS. HOWEVER, SOME ARE BEST TREATED AT CONCENTRATIONS OUTSIDE THIS RANGE. ACETAMINOPHEN CONCENTRATIONS >150 ug/mL AT 4 HOURS AFTER INGESTION AND >50 ug/mL AT 12 HOURS AFTER INGESTION ARE OFTEN ASSOCIATED WITH TOXIC REACTIONS.   CBC     Status: None   Collection Time: 08/01/14 11:33 AM  Result Value Ref Range   WBC 6.8 4.0 - 10.5 K/uL   RBC 4.96 4.22 - 5.81 MIL/uL   Hemoglobin 14.7 13.0 - 17.0 g/dL   HCT 43.9 39.0 - 52.0 %   MCV 88.5 78.0 - 100.0 fL   MCH 29.6 26.0 - 34.0 pg   MCHC 33.5 30.0 - 36.0 g/dL   RDW 13.0 11.5 - 15.5 %   Platelets 224 150 - 400 K/uL  Comprehensive metabolic panel  Status: None   Collection Time: 08/01/14 11:33 AM  Result Value Ref Range   Sodium 142 137 - 147 mEq/L   Potassium 4.3 3.7 - 5.3 mEq/L   Chloride 103 96 - 112 mEq/L   CO2 30 19 - 32 mEq/L   Glucose, Bld 92 70 - 99 mg/dL   BUN 9 6 - 23 mg/dL   Creatinine, Ser 0.88 0.50 - 1.35 mg/dL   Calcium 9.6 8.4 - 10.5 mg/dL   Total Protein 7.1 6.0 - 8.3 g/dL   Albumin 3.8 3.5 - 5.2 g/dL   AST 37 0 - 37 U/L   ALT 31 0 - 53 U/L   Alkaline Phosphatase 69 39 - 117 U/L   Total Bilirubin 0.6 0.3 - 1.2 mg/dL   GFR calc non Af Amer >90 >90 mL/min   GFR calc Af Amer >90 >90 mL/min    Comment: (NOTE) The eGFR has been calculated using the CKD EPI equation. This calculation has not been validated in all clinical situations. eGFR's persistently <90 mL/min signify possible Chronic  Kidney Disease.    Anion gap 9 5 - 15  Ethanol (ETOH)     Status: None   Collection Time: 08/01/14 11:33 AM  Result Value Ref Range   Alcohol, Ethyl (B) <11 0 - 11 mg/dL    Comment:        LOWEST DETECTABLE LIMIT FOR SERUM ALCOHOL IS 11 mg/dL FOR MEDICAL PURPOSES ONLY   Salicylate level     Status: Abnormal   Collection Time: 08/01/14 11:33 AM  Result Value Ref Range   Salicylate Lvl <7.3 (L) 2.8 - 20.0 mg/dL  Urine Drug Screen     Status: Abnormal   Collection Time: 08/01/14 12:03 PM  Result Value Ref Range   Opiates NONE DETECTED NONE DETECTED   Cocaine NONE DETECTED NONE DETECTED   Benzodiazepines NONE DETECTED NONE DETECTED   Amphetamines NONE DETECTED NONE DETECTED   Tetrahydrocannabinol POSITIVE (A) NONE DETECTED   Barbiturates NONE DETECTED NONE DETECTED    Comment:        DRUG SCREEN FOR MEDICAL PURPOSES ONLY.  IF CONFIRMATION IS NEEDED FOR ANY PURPOSE, NOTIFY LAB WITHIN 5 DAYS.        LOWEST DETECTABLE LIMITS FOR URINE DRUG SCREEN Drug Class       Cutoff (ng/mL) Amphetamine      1000 Barbiturate      200 Benzodiazepine   428 Tricyclics       768 Opiates          300 Cocaine          300 THC              50    Labs are reviewed and are pertinent for no medical issues.  Current Facility-Administered Medications  Medication Dose Route Frequency Provider Last Rate Last Dose  . acetaminophen (TYLENOL) tablet 650 mg  650 mg Oral Q4H PRN Domenic Moras, PA-C      . alum & mag hydroxide-simeth (MAALOX/MYLANTA) 200-200-20 MG/5ML suspension 30 mL  30 mL Oral PRN Domenic Moras, PA-C      . benztropine (COGENTIN) tablet 0.5 mg  0.5 mg Oral QHS Domenic Moras, PA-C      . divalproex (DEPAKOTE) DR tablet 250 mg  250 mg Oral Q12H Domenic Moras, PA-C   250 mg at 08/01/14 1246  . ibuprofen (ADVIL,MOTRIN) tablet 600 mg  600 mg Oral Q8H PRN Domenic Moras, PA-C      . ondansetron [facility]) tablet  4 mg  4 mg Oral Q8H PRN Domenic Moras, PA-C      . risperiDONE (RISPERDAL) tablet 2.5 mg  2.5 mg  Oral QHS Domenic Moras, PA-C      . traZODone (DESYREL) tablet 50 mg  50 mg Oral QHS PRN Domenic Moras, PA-C      . zolpidem (AMBIEN) tablet 5 mg  5 mg Oral QHS PRN Domenic Moras, PA-C       Current Outpatient Prescriptions  Medication Sig Dispense Refill  . benztropine (COGENTIN) 0.5 MG tablet Take 1 tablet (0.5 mg total) by mouth at bedtime. 30 tablet 0  . divalproex (DEPAKOTE) 250 MG DR tablet Take 1 tablet (250 mg total) by mouth every 12 (twelve) hours. 60 tablet 0  . risperiDONE (RISPERDAL) 0.5 MG tablet Take 5 tablets (2.5 mg total) by mouth at bedtime. 150 tablet 0  . traZODone (DESYREL) 50 MG tablet Take 1 tablet (50 mg total) by mouth at bedtime as needed for sleep. 30 tablet 0    Psychiatric Specialty Exam:     Blood pressure 128/59, pulse 68, temperature 98.1 F (36.7 C), temperature source Oral, resp. rate 20, SpO2 100 %.There is no weight on file to calculate BMI.  General Appearance: Casual  Eye Contact::  Good  Speech:  Normal Rate  Volume:  Normal  Mood:  Anxious and Irritable  Affect:  Congruent  Thought Process:  Coherent  Orientation:  Full (Time, Place, and Person)  Thought Content:  WDL  Suicidal Thoughts:  No  Homicidal Thoughts:  No  Memory:  Immediate;   Good Recent;   Good Remote;   Good  Judgement:  Fair  Insight:  Fair  Psychomotor Activity:  Normal  Concentration:  Good  Recall:  Good  Fund of Knowledge:Good  Language: Good  Akathisia:  No  Handed:  Right  AIMS (if indicated):     Assets:  Leisure Time Physical Health Resilience Social Support  Sleep:      Musculoskeletal: Strength & Muscle Tone: within normal limits Gait & Station: normal Patient leans: N/A  Treatment Plan Summary: Discharge home with follow-up with [facility] if issues present.  Waylan Boga, [place] 08/01/2014 5:50 PM  Patient seen, evaluated and I agree with notes by Nurse Practitioner. Corena Pilgrim, MD
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
## 2014-08-01 NOTE — ED Notes (Signed)
Patient is irritable, restless and cursing at staff.  When patient first came on the unit, he asked, "how long am I gonna have to stay here?  I'm not supposed to be here.  Fu** ya'll Fu** all of ya'll.  Patient has stayed in room most of the day resting quietly.  He has been compliant with his medication.  He has since got up and has been pacing the hallways.  He has poor insight as to why he is here.  [facility].  Patient denies any SI/HI/AVH.

## 2014-08-01 NOTE — [facility] Suicide Risk Assessment (Signed)
Suicide Risk Assessment  Discharge Assessment     Demographic Factors:  Male and Adolescent or young adult  Total Time spent with patient: 45 minutes  Psychiatric Specialty Exam:     Blood pressure 128/59, pulse 68, temperature 98.1 F (36.7 C), temperature source Oral, resp. rate 20, SpO2 100 %.There is no weight on file to calculate BMI.  General Appearance: Casual  Eye Contact::  Good  Speech:  Normal Rate  Volume:  Normal  Mood:  Anxious and Irritable  Affect:  Congruent  Thought Process:  Coherent  Orientation:  Full (Time, Place, and Person)  Thought Content:  WDL  Suicidal Thoughts:  No  Homicidal Thoughts:  No  Memory:  Immediate;   Good Recent;   Good Remote;   Good  Judgement:  Fair  Insight:  Fair  Psychomotor Activity:  Normal  Concentration:  Good  Recall:  Good  Fund of Knowledge:Good  Language: Good  Akathisia:  No  Handed:  Right  AIMS (if indicated):     Assets:  Leisure Time Physical Health Resilience Social Support  Sleep:      Musculoskeletal: Strength & Muscle Tone: within normal limits Gait & Station: normal Patient leans: N/A  Mental Status Per Nursing Assessment::   On Admission:   irritable about being here  Current Mental Status by Physician: NA  Loss Factors: NA  Historical Factors: NA  Risk Reduction Factors:   Sense of responsibility to family, Living with another person, especially a relative and Positive social support  Continued Clinical Symptoms:  None  Cognitive Features That Contribute To Risk:  None   Suicide Risk:  Minimal: No identifiable suicidal ideation.  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 Anxiety    AXIS IV:  other psychosocial  or environmental problems, problems related to social environment and problems with primary support group AXIS V:  61-70 mild symptoms  Plan Of Care/Follow-up recommendations:  Activity:  as tolerated Diet:  heart healthy diet  Is patient on multiple antipsychotic therapies at discharge:  No   Has Patient had three or more failed trials of antipsychotic monotherapy by history:  No  Recommended Plan for Multiple Antipsychotic Therapies: NA    Kit Brubacher, PMH-NP 08/01/2014, 5:58 PM

## 2014-08-01 NOTE — BH Assessment (Signed)
Informed pt that he will be evaluated in the morning for discharge.

## 2014-08-02 ENCOUNTER — Encounter (HOSPITAL_COMMUNITY): Payer: Self-pay | Admitting: Psychiatry

## 2014-08-02 DIAGNOSIS — F1994 Other psychoactive substance use, unspecified with psychoactive substance-induced mood disorder: Secondary | ICD-10-CM

## 2014-08-02 DIAGNOSIS — F1220 Cannabis dependence, uncomplicated: Secondary | ICD-10-CM

## 2014-08-02 MED ORDER — TRAZODONE HCL 50 MG PO TABS: 50.0000 mg | ORAL_TABLET | Freq: Every evening | ORAL | Status: DC | PRN

## 2014-08-02 MED ORDER — RISPERIDONE 0.5 MG PO TABS: 2.5000 mg | ORAL_TABLET | Freq: Every day | ORAL | Status: DC

## 2014-08-02 MED ORDER — DIVALPROEX SODIUM 250 MG PO DR TAB: 250.0000 mg | DELAYED_RELEASE_TABLET | Freq: Two times a day (BID) | ORAL | Status: DC

## 2014-08-02 NOTE — [facility] Suicide Risk Assessment (Signed)
Suicide Risk Assessment  Discharge Assessment     Demographic Factors:  Male, Low socioeconomic status and Unemployed young adult  Total Time spent with patient: 30 minutes  Psychiatric Specialty Exam:     Blood pressure 127/62, pulse 90, temperature 98.6 F (37 C), temperature source Oral, resp. rate 18, SpO2 100 %.There is no weight on file to calculate BMI.  General Appearance: Casual  Eye Contact::  Fair  Speech:  Clear and Coherent  Volume:  Normal  Mood:  Irritable  Affect:  Congruent  Thought Process:  Coherent, Goal Directed and Logical  Orientation:  Full (Time, Place, and Person)  Thought Content:  WDL  Suicidal Thoughts:  No  Homicidal Thoughts:  No  Memory:  Immediate;   Good Recent;   Good Remote;   Good  Judgement:  Fair makes independent detrimental decisions  Insight:  minimal  Psychomotor Activity:  Normal  Concentration:  Fair  Recall:  Fair  Fund of Knowledge:Good  Language: Good  Akathisia:  No  Handed:  Right  AIMS (if indicated):     Assets:  Communication Skills Physical Health Resilience  Sleep:       Musculoskeletal: Strength & Muscle Tone: within normal limits Gait & Station: normal Patient leans: N/A   Mental Status Per Nursing Assessment::   On Admission:    angry irritable stating that he doesn't know why he as committed.  No evidence of delusions, hallucinations, Suicidal or homicidal ideation  Current Mental Status by Physician: no intention to harm himself or others   Loss Factors: Financial problems/change in socioeconomic status  Historical Factors: Impulsivity  Risk Reduction Factors:   Positive coping skills or problem solving skills  Continued Clinical Symptoms:  none  Cognitive Features That Contribute To Risk:  none  Suicide Risk:  Minimal: No identifiable suicidal ideation.  Patients presenting with no risk factors but with morbid ruminations; may be classified as minimal risk based on the severity of the  depressive symptoms  Discharge Diagnoses:   AXIS I:  Substance Abuse AXIS II:  Deferred AXIS III:   Past Medical History  Diagnosis Date  . Anxiety    AXIS IV:  economic problems, housing problems and problems related to social environment AXIS V:  61-70 mild symptoms  Plan Of Care/Follow-up recommendations:  Activity:  as tolerated Diet:  heart healthy well balanced meals  Is patient on multiple antipsychotic therapies at discharge:  No   Has Patient had three or more failed trials of antipsychotic monotherapy by history:  No  Recommended Plan for Multiple Antipsychotic Therapies: NA    Bonnetta Barryisbach, Shelly  PMHNP-BC 08/02/2014, 11:24 AM   Patient case reviewed with me, and rounded on patient with NP.

## 2014-08-02 NOTE — Consult Note (Signed)
[facility] Face-to-Face Psychiatry Consult   Reason for Consult:  Agitation, r/o psychosis Referring Physician:  EDP Vincent [name] Vincent [name] is an 18 y.o. male. Total Time spent with patient: 30 minutes  Assessment: AXIS I:  Substance Induced Mood DisorderCannabis Dependency  AXIS II:  Deferred AXIS III:   Past Medical History  Diagnosis Date  . Anxiety    AXIS IV:  housing problems, other psychosocial or environmental problems and problems related to legal system/crime AXIS V:  61-70 mild symptoms  Plan:  No evidence of imminent risk to self or others at present.    Subjective:   Vincent [name] is a 18 y.o. male patient admitted under IVC related to psychotic behavior where he was noted to be going up to strangers on the street, getting into the individuals personal space, and doing drugs.Marland Kitchen  HPI:  Vincent [name] is a 18 year old African American Male who presents to the Emergency department under IVC for continued drug use with concerns regarding potential drug induced psychosis.  Patient has been adamant since admission that he does not need to be here, that "godmother"  Is overstepping and getting involved in his life.  Patient relates that he feels fine, denies homicidal or suicidal ideation, states "I'm Good this is just who I am"  Patient admits he could make better choices in terms of smoking Marijuana but feels that he can manage this.  Patient denies auditory or visual hallucinations no evidence of attending to internal stimuli.  No evidence of delusions.  Patient is logical and goal directed in conversation.  Mood is irritable owing to having community member filing committal papers.  Affect is congruent.  Patient denies signs and symptoms of depression.  Denies any legal trouble. HPI Elements:   Location:  generalized. Quality:  acute. Severity:  mild. Timing:  ongoing. Duration:  past week . Context:  stressors.  Past Psychiatric History: Past Medical History  Diagnosis  Date  . Anxiety     reports that he has been smoking.  He has never used smokeless tobacco. He reports that he uses illicit drugs (Marijuana). He reports that he does not drink alcohol. History reviewed. No pertinent family history.         Allergies:   Allergies  Allergen Reactions  . Shrimp [Shellfish Allergy] Anaphylaxis    ACT Assessment Complete:  Yes:    Educational Status    Risk to Self: Risk to self with the past 6 months Substance abuse history and/or treatment for substance abuse?: Yes  Risk to Others:    Abuse:    Prior Inpatient Therapy:    Prior Outpatient Therapy:    Additional Information:                    Objective: Blood pressure 127/62, pulse 90, temperature 98.6 F (37 C), temperature source Oral, resp. rate 18, SpO2 100 %.There is no weight on file to calculate BMI. Results for orders placed or performed during the hospital encounter of 08/01/14 (from the past 72 hour(s))  Acetaminophen level     Status: None   Collection Time: 08/01/14 11:33 AM  Result Value Ref Range   Acetaminophen (Tylenol), Serum <15.0 10 - 30 ug/mL    Comment:        THERAPEUTIC CONCENTRATIONS VARY SIGNIFICANTLY. A RANGE OF 10-30 ug/mL MAY BE AN EFFECTIVE CONCENTRATION FOR MANY PATIENTS. HOWEVER, SOME ARE BEST TREATED AT CONCENTRATIONS OUTSIDE THIS RANGE. ACETAMINOPHEN CONCENTRATIONS >150 ug/mL AT 4 HOURS  AFTER INGESTION AND >50 ug/mL AT 12 HOURS AFTER INGESTION ARE OFTEN ASSOCIATED WITH TOXIC REACTIONS.   CBC     Status: None   Collection Time: 08/01/14 11:33 AM  Result Value Ref Range   WBC 6.8 4.0 - 10.5 K/uL   RBC 4.96 4.22 - 5.81 MIL/uL   Hemoglobin 14.7 13.0 - 17.0 g/dL   HCT 43.9 39.0 - 52.0 %   MCV 88.5 78.0 - 100.0 fL   MCH 29.6 26.0 - 34.0 pg   MCHC 33.5 30.0 - 36.0 g/dL   RDW 13.0 11.5 - 15.5 %   Platelets 224 150 - 400 K/uL  Comprehensive metabolic panel     Status: None   Collection Time: 08/01/14 11:33 AM  Result Value Ref Range    Sodium 142 137 - 147 mEq/L   Potassium 4.3 3.7 - 5.3 mEq/L   Chloride 103 96 - 112 mEq/L   CO2 30 19 - 32 mEq/L   Glucose, Bld 92 70 - 99 mg/dL   BUN 9 6 - 23 mg/dL   Creatinine, Ser 0.88 0.50 - 1.35 mg/dL   Calcium 9.6 8.4 - 10.5 mg/dL   Total Protein 7.1 6.0 - 8.3 g/dL   Albumin 3.8 3.5 - 5.2 g/dL   AST 37 0 - 37 U/L   ALT 31 0 - 53 U/L   Alkaline Phosphatase 69 39 - 117 U/L   Total Bilirubin 0.6 0.3 - 1.2 mg/dL   GFR calc non Af Amer >90 >90 mL/min   GFR calc Af Amer >90 >90 mL/min    Comment: (NOTE) The eGFR has been calculated using the CKD EPI equation. This calculation has not been validated in all clinical situations. eGFR's persistently <90 mL/min signify possible Chronic Kidney Disease.    Anion gap 9 5 - 15  Ethanol (ETOH)     Status: None   Collection Time: 08/01/14 11:33 AM  Result Value Ref Range   Alcohol, Ethyl (B) <11 0 - 11 mg/dL    Comment:        LOWEST DETECTABLE LIMIT FOR SERUM ALCOHOL IS 11 mg/dL FOR MEDICAL PURPOSES ONLY   Salicylate level     Status: Abnormal   Collection Time: 08/01/14 11:33 AM  Result Value Ref Range   Salicylate Lvl <9.4 (L) 2.8 - 20.0 mg/dL  Urine Drug Screen     Status: Abnormal   Collection Time: 08/01/14 12:03 PM  Result Value Ref Range   Opiates NONE DETECTED NONE DETECTED   Cocaine NONE DETECTED NONE DETECTED   Benzodiazepines NONE DETECTED NONE DETECTED   Amphetamines NONE DETECTED NONE DETECTED   Tetrahydrocannabinol POSITIVE (A) NONE DETECTED   Barbiturates NONE DETECTED NONE DETECTED    Comment:        DRUG SCREEN FOR MEDICAL PURPOSES ONLY.  IF CONFIRMATION IS NEEDED FOR ANY PURPOSE, NOTIFY LAB WITHIN 5 DAYS.        LOWEST DETECTABLE LIMITS FOR URINE DRUG SCREEN Drug Class       Cutoff (ng/mL) Amphetamine      1000 Barbiturate      200 Benzodiazepine   854 Tricyclics       627 Opiates          300 Cocaine          300 THC              50    Labs are reviewed and are pertinent for medical issues being  treated  Current Facility-Administered Medications  Medication  Dose Route Frequency Provider Last Rate Last Dose  . acetaminophen (TYLENOL) tablet 650 mg  650 mg Oral Q4H PRN Domenic Moras, PA-C      . alum & mag hydroxide-simeth (MAALOX/MYLANTA) 200-200-20 MG/5ML suspension 30 mL  30 mL Oral PRN Domenic Moras, PA-C      . benztropine (COGENTIN) tablet 0.5 mg  0.5 mg Oral QHS Domenic Moras, PA-C   0.5 mg at 08/01/14 2150  . divalproex (DEPAKOTE) DR tablet 250 mg  250 mg Oral Q12H Domenic Moras, PA-C   250 mg at 08/02/14 1032  . ibuprofen (ADVIL,MOTRIN) tablet 600 mg  600 mg Oral Q8H PRN Domenic Moras, PA-C      . ondansetron [facility]) tablet 4 mg  4 mg Oral Q8H PRN Domenic Moras, PA-C      . risperiDONE (RISPERDAL) tablet 2.5 mg  2.5 mg Oral QHS Domenic Moras, PA-C   2.5 mg at 08/01/14 2151  . traZODone (DESYREL) tablet 50 mg  50 mg Oral QHS PRN Domenic Moras, PA-C      . zolpidem (AMBIEN) tablet 5 mg  5 mg Oral QHS PRN Domenic Moras, PA-C       Current Outpatient Prescriptions  Medication Sig Dispense Refill  . benztropine (COGENTIN) 0.5 MG tablet Take 1 tablet (0.5 mg total) by mouth at bedtime. 30 tablet 0  . divalproex (DEPAKOTE) 250 MG DR tablet Take 1 tablet (250 mg total) by mouth every 12 (twelve) hours. 60 tablet 0  . risperiDONE (RISPERDAL) 0.5 MG tablet Take 5 tablets (2.5 mg total) by mouth at bedtime. 150 tablet 0  . traZODone (DESYREL) 50 MG tablet Take 1 tablet (50 mg total) by mouth at bedtime as needed for sleep. For insomnia 30 tablet 0   Psychiatric Specialty Exam:     Blood pressure 127/62, pulse 90, temperature 98.6 F (37 C), temperature source Oral, resp. rate 18, SpO2 100 %.There is no weight on file to calculate BMI.  General Appearance: Casual  Eye Contact:: Fair  Speech: Clear and Coherent  Volume: Normal  Mood: Irritable  Affect: Congruent  Thought Process: Coherent, Goal Directed and Logical  Orientation: Full (Time, Place, and Person)  Thought Content: WDL   Suicidal Thoughts: No  Homicidal Thoughts: No  Memory: Immediate; Good Recent; Good Remote; Good  Judgement: Fair makes independent detrimental decisions  Insight: minimal  Psychomotor Activity: Normal  Concentration: Fair  Recall: [place] of Knowledge:Good  Language: Good  Akathisia: No  Handed: Right  AIMS (if indicated):    Assets: Communication Skills Physical Health Resilience  Sleep:      Musculoskeletal: Strength & Muscle Tone: within normal limits Gait & Station: normal Patient leans: N/A     Treatment Plan Summary: Patient to discharge home and follow up with outpatient psychiatrist.  Encouraged to a program to work towards sobriety.   Kennedy Bucker PMHNP-BC 08/02/2014 12:03 PM   Patient case reviewed with me as above

## 2014-08-02 NOTE — ED Notes (Signed)
Patient pacing the hallway.  He is agitated and restless.  He states, "I need to get out of here NOW!"  Informed patient that doctor will put in orders for him as soon as he returns from rounds.  Patient has a visitor with him now [name] who is attempting to keep him calm.  His visitor is concerned because she has nowhere to take the patient.  [place]

## 2014-10-02 ENCOUNTER — Emergency Department (INDEPENDENT_AMBULATORY_CARE_PROVIDER_SITE_OTHER)
Admission: EM | Admit: 2014-10-02 | Discharge: 2014-10-02 | Disposition: A | Discharge status: Home / Self Care | Payer: Self-pay | Source: Home / Self Care | Attending: Family Medicine | Admitting: Family Medicine

## 2014-10-02 ENCOUNTER — Encounter (HOSPITAL_COMMUNITY): Payer: Self-pay | Admitting: Emergency Medicine

## 2014-10-02 DIAGNOSIS — S61412A Laceration without foreign body of left hand, initial encounter: Secondary | ICD-10-CM

## 2014-10-02 DIAGNOSIS — M25649 Stiffness of unspecified hand, not elsewhere classified: Secondary | ICD-10-CM

## 2014-10-02 DIAGNOSIS — R29898 Other symptoms and signs involving the musculoskeletal system: Secondary | ICD-10-CM

## 2014-10-02 NOTE — ED Notes (Signed)
Pt is here to have staples removed [facility] [place].  Pt reports no issues and the wound looks clean and intact.

## 2014-10-02 NOTE — Discharge Instructions (Signed)
Thank you for coming in today. Follow-up with the hand surgeon if possible for range of motion of your finger  Scar Minimization You will have a scar anytime you have surgery and a cut is made in the skin or you have something removed from your skin (mole, skin cancer, cyst). Although scars are unavoidable following surgery, there are ways to minimize their appearance. It is important to follow all the instructions you receive from your caregiver about wound care. How your wound heals will influence the appearance of your scar. If you do not follow the wound care instructions as directed, complications such as infection may occur. Wound instructions include keeping the wound clean, moist, and not letting the wound form a scab. Some people form scars that are raised and lumpy (hypertrophic) or larger than the initial wound (keloidal). HOME CARE INSTRUCTIONS   Follow wound care instructions as directed.  Keep the wound clean by washing it with soap and water.  Keep the wound moist with provided antibiotic cream or petroleum jelly until completely healed. Moisten twice a day for about 2 weeks.  Get stitches (sutures) taken out at the scheduled time.  Avoid touching or manipulating your wound unless needed. Wash your hands thoroughly before and after touching your wound.  Follow all restrictions such as limits on exercise or work. This depends on where your scar is located.  Keep the scar protected from sunburn. Cover the scar with sunscreen/sunblock with SPF 30 or higher.  Gently massage the scar using a circular motion to help minimize the appearance of the scar. Do this only after the wound has closed and all the sutures have been removed.  For hypertrophic or keloidal scars, there are several ways to treat and minimize their appearance. Methods include compression therapy, intralesional corticosteroids, laser therapy, or surgery. These methods are performed by your caregiver. Remember that  the scar may appear lighter or darker than your normal skin color. This difference in color should even out with time. SEEK MEDICAL CARE IF:   You have a fever.  You develop signs of infection such as pain, redness, pus, and warmth.  You have questions or concerns. Document Released: 02/23/2010 Document Revised: 11/28/2011 Document Reviewed: 02/23/2010 [facility] Patient Information 2015 [employer] [place]. This information is not intended to replace advice given to you by your health care provider. Make sure you discuss any questions you have with your health care provider.

## 2014-10-02 NOTE — ED Provider Notes (Signed)
Vincent Vincent [name] Vincent [name] is a 19 y.o. male who presents to Urgent Care today for left hand laceration. Patient suffered a laceration to the dorsal aspect of his left hand in early December. [facility]. He is here today for staple removal. He notes some limitations with fully extending his left fourth digit. He has multiple psychiatric comorbidities which led to the delay in stable removal.   Past Medical History  Diagnosis Date  . Anxiety    Past Surgical History  Procedure Laterality Date  . Mandible surgery     History  Substance Use Topics  . Smoking status: Current Some Day Smoker -- 0.50 packs/day  . Smokeless tobacco: Never Used  . Alcohol Use: No   ROS as above Medications: No current facility-administered medications for this encounter.   Current Outpatient Prescriptions  Medication Sig Dispense Refill  . benztropine (COGENTIN) 0.5 MG tablet Take 1 tablet (0.5 mg total) by mouth at bedtime. 30 tablet 0  . divalproex (DEPAKOTE) 250 MG DR tablet Take 1 tablet (250 mg total) by mouth every 12 (twelve) hours. 60 tablet 0  . risperiDONE (RISPERDAL) 0.5 MG tablet Take 5 tablets (2.5 mg total) by mouth at bedtime. 150 tablet 0  . traZODone (DESYREL) 50 MG tablet Take 1 tablet (50 mg total) by mouth at bedtime as needed for sleep. For insomnia 30 tablet 0   Allergies  Allergen Reactions  . Shrimp [Shellfish Allergy] Anaphylaxis     Exam:  BP 146/77 mmHg  Pulse 75  Temp(Src) 98.1 F (36.7 C) (Oral)  Resp 14  SpO2 100% Gen: Well NAD Left hand large well-appearing scar. Lacks full extension of his left ring finger at the MCP by about 5. Strength is intact in all other modalities. Sensation is intact throughout  Multiple staples removed. Patient appeared well.  No results found for this or any previous visit (from the past 24 hour(s)). No results found.  Assessment and Plan: 19 y.o. male with laceration. Appears well.  Follow-up with hand surgery regarding limitation of range of motion. Return as needed. Scar minimization technique reviewed.  Discussed warning signs or symptoms. Please see discharge instructions. Patient expresses understanding.     Rodolph BongEvan S Corey, MD 10/02/14 [phone]

## 2014-10-17 ENCOUNTER — Encounter (HOSPITAL_COMMUNITY): Payer: Self-pay

## 2014-10-17 ENCOUNTER — Encounter (HOSPITAL_COMMUNITY): Payer: Self-pay | Admitting: *Deleted

## 2014-10-17 ENCOUNTER — Emergency Department (HOSPITAL_COMMUNITY)
Admission: EM | Admit: 2014-10-17 | Discharge: 2014-10-17 | Disposition: A | Discharge status: Other Acute Inpatient Hospital | Payer: Self-pay | Attending: Emergency Medicine | Admitting: Emergency Medicine

## 2014-10-17 ENCOUNTER — Inpatient Hospital Stay (HOSPITAL_COMMUNITY)
Admission: AD | Admit: 2014-10-17 | Discharge: 2014-10-22 | DRG: 897 | Disposition: A | Discharge status: Home / Self Care | Payer: 59 | Source: Intra-hospital | Attending: Psychiatry | Admitting: Psychiatry

## 2014-10-17 DIAGNOSIS — Z9114 Patient's other noncompliance with medication regimen: Secondary | ICD-10-CM | POA: Diagnosis present

## 2014-10-17 DIAGNOSIS — F12951 Cannabis use, unspecified with psychotic disorder with hallucinations: Secondary | ICD-10-CM | POA: Diagnosis present

## 2014-10-17 DIAGNOSIS — F209 Schizophrenia, unspecified: Secondary | ICD-10-CM

## 2014-10-17 DIAGNOSIS — Z79899 Other long term (current) drug therapy: Secondary | ICD-10-CM | POA: Insufficient documentation

## 2014-10-17 DIAGNOSIS — F1210 Cannabis abuse, uncomplicated: Secondary | ICD-10-CM | POA: Insufficient documentation

## 2014-10-17 DIAGNOSIS — F29 Unspecified psychosis not due to a substance or known physiological condition: Secondary | ICD-10-CM | POA: Diagnosis present

## 2014-10-17 DIAGNOSIS — G4700 Insomnia, unspecified: Secondary | ICD-10-CM | POA: Diagnosis present

## 2014-10-17 DIAGNOSIS — F17210 Nicotine dependence, cigarettes, uncomplicated: Secondary | ICD-10-CM | POA: Diagnosis present

## 2014-10-17 DIAGNOSIS — F1994 Other psychoactive substance use, unspecified with psychoactive substance-induced mood disorder: Secondary | ICD-10-CM | POA: Diagnosis present

## 2014-10-17 DIAGNOSIS — F12251 Cannabis dependence with psychotic disorder with hallucinations: Secondary | ICD-10-CM | POA: Diagnosis present

## 2014-10-17 DIAGNOSIS — F419 Anxiety disorder, unspecified: Secondary | ICD-10-CM | POA: Insufficient documentation

## 2014-10-17 DIAGNOSIS — F1220 Cannabis dependence, uncomplicated: Secondary | ICD-10-CM | POA: Diagnosis present

## 2014-10-17 LAB — RAPID URINE DRUG SCREEN, HOSP PERFORMED
Amphetamines: NOT DETECTED
BENZODIAZEPINES: NOT DETECTED
Barbiturates: NOT DETECTED
Cocaine: NOT DETECTED
Opiates: NOT DETECTED
Tetrahydrocannabinol: POSITIVE — AB

## 2014-10-17 LAB — ETHANOL: Alcohol, Ethyl (B): <5 mg/dL (ref 0–9)

## 2014-10-17 LAB — COMPREHENSIVE METABOLIC PANEL
ALBUMIN: 4.5 g/dL (ref 3.5–5.2)
ALK PHOS: 77 U/L (ref 39–117)
ALT: 23 U/L (ref 0–53)
ANION GAP: 5 (ref 5–15)
AST: 23 U/L (ref 0–37)
BUN: 13 mg/dL (ref 6–23)
CALCIUM: 9.4 mg/dL (ref 8.4–10.5)
CHLORIDE: 104 mmol/L (ref 96–112)
CO2: 30 mmol/L (ref 19–32)
Creatinine, Ser: 0.91 mg/dL (ref 0.50–1.35)
GFR calc Af Amer: >90 mL/min (ref >90)
GFR calc non Af Amer: >90 mL/min (ref >90)
GLUCOSE: 96 mg/dL (ref 70–99)
Potassium: 3.9 mmol/L (ref 3.5–5.1)
Sodium: 139 mmol/L (ref 135–145)
TOTAL PROTEIN: 7.4 g/dL (ref 6.0–8.3)
Total Bilirubin: 0.9 mg/dL (ref 0.3–1.2)

## 2014-10-17 LAB — ACETAMINOPHEN LEVEL: Acetaminophen (Tylenol), Serum: <10 ug/mL — ABNORMAL LOW (ref 10–30)

## 2014-10-17 LAB — CBC
HEMATOCRIT: 45.2 % (ref 39.0–52.0)
HEMOGLOBIN: 15.7 g/dL (ref 13.0–17.0)
MCH: 30.3 pg (ref 26.0–34.0)
MCHC: 34.7 g/dL (ref 30.0–36.0)
MCV: 87.1 fL (ref 78.0–100.0)
PLATELETS: 246 10*3/uL (ref 150–400)
RBC: 5.19 MIL/uL (ref 4.22–5.81)
RDW: 12.2 % (ref 11.5–15.5)
WBC: 5.1 10*3/uL (ref 4.0–10.5)

## 2014-10-17 LAB — SALICYLATE LEVEL: Salicylate Lvl: <4 mg/dL (ref 2.8–20.0)

## 2014-10-17 MED ORDER — ZOLPIDEM TARTRATE 5 MG PO TABS: 5.0000 mg | ORAL_TABLET | Freq: Every evening | ORAL | Status: DC | PRN

## 2014-10-17 MED ORDER — ACETAMINOPHEN 325 MG PO TABS: 650.0000 mg | ORAL_TABLET | Freq: Four times a day (QID) | ORAL | Status: DC | PRN

## 2014-10-17 MED ORDER — NICOTINE 21 MG/24HR TD PT24
21.0000 mg | MEDICATED_PATCH | Freq: Every day | TRANSDERMAL | Status: DC
  Administered 2014-10-18 – 2014-10-19 (×2): 21 mg via TRANSDERMAL
  Filled 2014-10-17 (×7): qty 1

## 2014-10-17 MED ORDER — NICOTINE 21 MG/24HR TD PT24
21.0000 mg | MEDICATED_PATCH | Freq: Every day | TRANSDERMAL | Status: DC
  Administered 2014-10-17: 21 mg via TRANSDERMAL
  Filled 2014-10-17: qty 1

## 2014-10-17 MED ORDER — ACETAMINOPHEN 325 MG PO TABS: 650.0000 mg | ORAL_TABLET | ORAL | Status: DC | PRN

## 2014-10-17 MED ORDER — ONDANSETRON HCL 4 MG PO TABS: 4.0000 mg | ORAL_TABLET | Freq: Three times a day (TID) | ORAL | Status: DC | PRN

## 2014-10-17 MED ORDER — ALUM & MAG HYDROXIDE-SIMETH 200-200-20 MG/5ML PO SUSP: 30.0000 mL | ORAL | Status: DC | PRN

## 2014-10-17 MED ORDER — LORAZEPAM 1 MG PO TABS: 1.0000 mg | ORAL_TABLET | Freq: Three times a day (TID) | ORAL | Status: DC | PRN

## 2014-10-17 MED ORDER — RISPERIDONE 1 MG PO TABS
1.0000 mg | ORAL_TABLET | Freq: Two times a day (BID) | ORAL | Status: DC
  Administered 2014-10-17 – 2014-10-22 (×10): 1 mg via ORAL
  Filled 2014-10-17 (×4): qty 1
  Filled 2014-10-17: qty 28
  Filled 2014-10-17 (×7): qty 1
  Filled 2014-10-17: qty 28
  Filled 2014-10-17 (×2): qty 1

## 2014-10-17 MED ORDER — IBUPROFEN 200 MG PO TABS: 600.0000 mg | ORAL_TABLET | Freq: Three times a day (TID) | ORAL | Status: DC | PRN

## 2014-10-17 MED ORDER — MAGNESIUM HYDROXIDE 400 MG/5ML PO SUSP: 30.0000 mL | Freq: Every day | ORAL | Status: DC | PRN

## 2014-10-17 MED ORDER — TRAZODONE HCL 50 MG PO TABS
50.0000 mg | ORAL_TABLET | Freq: Every evening | ORAL | Status: DC | PRN
  Administered 2014-10-17 – 2014-10-20 (×2): 50 mg via ORAL
  Filled 2014-10-17: qty 14
  Filled 2014-10-17 (×3): qty 1

## 2014-10-17 MED ORDER — IBUPROFEN 600 MG PO TABS
600.0000 mg | ORAL_TABLET | Freq: Three times a day (TID) | ORAL | Status: DC | PRN
  Filled 2014-10-17: qty 1

## 2014-10-17 MED ORDER — LORAZEPAM 1 MG PO TABS
1.0000 mg | ORAL_TABLET | Freq: Three times a day (TID) | ORAL | Status: DC | PRN
  Administered 2014-10-21: 1 mg via ORAL
  Filled 2014-10-17 (×2): qty 1

## 2014-10-17 NOTE — ED Notes (Signed)
Patient ambulated back to unit from TCU.  Oriented to surroundings.  Offered fluids.  He has been pleasant and cooperative.  States he is in the hospital because his mama wants him to have a mental health evaluation.  He has been up walking in the hallways and watching television in the dayroom since arrival to the unit.

## 2014-10-17 NOTE — ED Notes (Signed)
Patient belongings in locker 31 
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
## 2014-10-17 NOTE — ED Notes (Signed)
TTS at bedside. 

## 2014-10-17 NOTE — ED Notes (Signed)
[facility]. Pt. Alert and oriented in no distress denies SI, HI, AVH and pain.  Pt. Instructed to come to me with problems or concerns.Will continue to monitor for safety via security cameras and Q 15 minute checks.

## 2014-10-17 NOTE — ED Notes (Signed)
Pt reports he is here "to take his medicine." Sts he isn't sure what his Dx is, has never taken meds that have been Rx'd to him. Mother called and stated he is Dx with schizophrenia and told her he is "ready to get help, has been walking the streets and is tired of it, wants to get on his meds." Pt denies SI/HI.
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
## 2014-10-17 NOTE — ED Notes (Signed)
Bed: WA29 Expected date:  Expected time:  Means of arrival:  Comments: 

## 2014-10-17 NOTE — Tx Team (Addendum)
Initial Interdisciplinary Treatment Plan   PATIENT STRESSORS: Financial difficulties Marital or family conflict Medication change or noncompliance   PATIENT STRENGTHS: Communication skills Supportive family/friends   PROBLEM LIST: Problem List/Patient Goals Date to be addressed Date deferred Reason deferred Estimated date of resolution  Pt. Denies SI and denies AH and VH although pt. Was giggling inappropriately during the assessment      Family reports AH and VH      Pt. Noncompliant with his meds            "I need medicine to stop these visions I am having"      "I need help getting rid of these crazy thoughts"                         DISCHARGE CRITERIA:  Adequate post-discharge living arrangements Improved stabilization in mood, thinking, and/or behavior Need for constant or close observation no longer present Verbal commitment to aftercare and medication compliance  PRELIMINARY DISCHARGE PLAN: Participate in family therapy Placement in alternative living arrangements  PATIENT/FAMIILY INVOLVEMENT: This treatment plan has been presented to and reviewed with the patient, Vincent [name], and/or family member, .  The patient and family have been given the opportunity to ask questions and make suggestions.  Cooper RenderSadler, Pamela Jean Horne 10/17/2014, 10:23 PM

## 2014-10-17 NOTE — BH Assessment (Signed)
Per Rosey BathKelly Southard [facility] pt [facility]. Support paperwork signed and [facility]. Originals placed in pt's chart. Pt requested Clinical research associatewriter contact pt's mom re: [facility]. Writer called and informed pt's Mom re: [facility]. Mom reports that she will have to be out of town for a couple of days but Armed forces training and education officerthanks writer for keeping mom informed.  Evette Cristalaroline Paige Oyindamola Key, [place] Assessment Counselor

## 2014-10-17 NOTE — BH Assessment (Signed)
Pt's mom Karis Jubaakisha Mills [phone]. She asks to be kept apprised of pt's disposition. Pt signed consent to release info for Mom.  Evette Cristalaroline Paige Caidyn Blossom, [place] Assessment Counselor

## 2014-10-18 DIAGNOSIS — F209 Schizophrenia, unspecified: Secondary | ICD-10-CM

## 2014-10-18 MED ORDER — DIVALPROEX SODIUM ER 250 MG PO TB24
250.0000 mg | ORAL_TABLET | Freq: Two times a day (BID) | ORAL | Status: DC
  Administered 2014-10-18: 250 mg via ORAL
  Filled 2014-10-18 (×8): qty 1

## 2014-10-18 NOTE — BHH Suicide Risk Assessment (Signed)
[facility] Admission Suicide Risk Assessment   Nursing information obtained from:  Patient Demographic factors:  Adolescent or young adult, Unemployed Current Mental Status:  NA Loss Factors:  NA Historical Factors:  Family history of mental illness or substance abuse, Impulsivity Risk Reduction Factors:  Living with another person, especially a relative, Positive social support Total Time spent with patient: 30 minutes Principal Problem: <principal problem not specified> Diagnosis:   Patient Active Problem List   Diagnosis Date Noted  . Schizophrenia, unspecified type [F20.9] 10/17/2014  . Schizophrenia [F20.9] 10/17/2014  . 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 Cannabis use disorder, severe, dependence [F12.20]   . Episodic mood disorder [F39] 12/06/2013  . Cannabis dependence [F12.20] 12/03/2013  . 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 Psychosis [F29] 12/02/2013     Continued Clinical Symptoms:  Alcohol Use Disorder Identification Test Final Score (AUDIT): 1 The "Alcohol Use Disorders Identification Test", Guidelines for Use in Primary Care, Second Edition.  World Science writer [facility]). Score between 0-7:  no or low risk or alcohol related problems. Score between 8-15:  moderate risk of alcohol related problems. Score between 16-19:  high risk of alcohol related problems. Score 20 or above:  warrants further diagnostic evaluation for alcohol dependence and treatment.   CLINICAL FACTORS:   Schizophrenia:   Less than 67 years old   Musculoskeletal: Strength & Muscle Tone: within normal limits Gait & Station: normal Patient leans: N/A  Psychiatric Specialty Exam: Physical Exam  Review of Systems   Constitutional: Negative.   HENT: Negative.   Eyes: Positive for blurred vision.  Respiratory: Negative.        Varies  Cardiovascular: Negative.   Gastrointestinal: Negative.   Genitourinary: Negative.   Musculoskeletal: Negative.   Skin: Negative.   Neurological: Positive for dizziness.  Endo/Heme/Allergies: Negative.   Psychiatric/Behavioral: Positive for depression, hallucinations and substance abuse. The patient is nervous/anxious.     Blood pressure 129/83, pulse 102, temperature 97.6 F (36.4 C), resp. rate 18, height 6' (1.829 m), weight 76.204 kg (168 lb).Body mass index is 22.78 kg/(m^2).  General Appearance: Disheveled  Eye Solicitor::  Fair  Speech:  Blocked  Volume:  fluctuates  Mood:  Anxious and worried  Affect:  Restricted  Thought Process:  Tangential, very reserved, guarded minimizing  Orientation:  Other:  place, person  Thought Content:  not needing to be here, wanting a job, other content irrelevant  Suicidal Thoughts:  No  Homicidal Thoughts:  No  Memory:  Immediate;   Poor Recent;   Fair Remote;   Fair  Judgement:  Fair  Insight:  Lacking  Psychomotor Activity:  Restlessness  Concentration:  Poor  Recall:  Poor  Fund of Knowledge:Poor  Language: Fair  Akathisia:  No  Handed:  Right  AIMS (if indicated):     Assets:  Housing Social Support  Sleep:  Number of Hours: 6.75  Cognition: WNL  ADL's:  Intact     COGNITIVE FEATURES THAT CONTRIBUTE TO RISK:  Closed-mindedness, Polarized thinking and Thought constriction (tunnel vision)    SUICIDE RISK:   Moderate:   PLAN OF CARE:  Supportive approach/coping skills/relapse prevention  Though disorder: will reassess and optimize use of psychotropics                                Consider use of injectable medication                                Cannabis dependence: will evaluate for active withdrawal and address the                                   symptoms. Will  work a relapse Control and instrumentation engineerprevention plan  Medical Decision Making:  Review or order clinical lab tests (1), Review of Medication Regimen & Side Effects (2) and Review of New Medication or Change in Dosage (2)  I certify that inpatient services furnished can reasonably be expected to improve the patient's condition.   Tamir Wallman A 10/18/2014, 9:34 AM

## 2014-10-18 NOTE — H&P (Signed)
Psychiatric Admission Assessment Adult  Patient Identification: Vincent [name] MRN:  [id number] Date of Evaluation:  10/18/2014 Chief Complaint:  PSYCHOTIC DISORDER Principal Diagnosis: <principal problem not specified> Diagnosis:   Patient Active Problem List   Diagnosis Date Noted  . Schizophrenia, unspecified type [F20.9] 10/17/2014  . Schizophrenia [F20.9] 10/17/2014  . 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 Cannabis use disorder, severe, dependence [F12.20]   . Episodic mood disorder [F39] 12/06/2013  . Cannabis dependence [F12.20] 12/03/2013  . 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 Psychosis [F29] 12/02/2013   History of Present Illness: Vincent [name] is 19 years old, an African-American male. Admitted from the [facility]. Recently discharged from this hospital after a mood stabilization treatments. He reports, "Vincent [name] keep saying I was aggressive, that I would not take my medicines. You know how women are, always try to instigate murder fucking stuff. You know what I mean. This kind of person should not be messing with my people. I still don't know what is wrong with me. Is it speech impediment? Am I retarded or what?  Vincent [name] is alert & oriented to person, disheveled, hair in dreads. He is currently presenting with disorganized/illogical, tangential speech & though blocking. He appears restless and fidgeting throughout this assessment. He seems to be aware that that his thoughts are not right. He appears to be responding to some internal stimuli. At this point, he is a poor historian. His UDS test result is positive for Cannabis. He has a large healed scar to back of left hand/wrist. Vincent [name] says he  punched his left hand through a glass.   Elements:  Location:  Shizophrenis. Quality:  Bizarre behavior, thought blocking, distractions. Severity:  Severe. Timing:  On going & current. Duration:  Chronic. Context:  Bizarre behaviors.  Associated Signs/Symptoms:  Depression Symptoms:  difficulty concentrating, impaired memory,  (Hypo) Manic Symptoms:  Delusions, Distractibility, Flight of Ideas, Hallucinations,  Anxiety Symptoms:  Restless mind  Psychotic Symptoms:  Delusions, Hallucinations: responding to some internal stimuli Paranoia,  PTSD Symptoms: NA  Total Time spent with patient: 1 hour  Past Medical History:  Past Medical History  Diagnosis Date  . Anxiety     Past Surgical History  Procedure Laterality Date  . Mandible surgery    . Wrist surgery      laceration by glass that pt sts was closed with surgery   Family History: History reviewed. No pertinent family history. Social History:  History  Alcohol Use  . 0.6 oz/week  . 1 Shots of liquor per week    Comment: occasionally/ maybe once a month     History  Drug Use  . Yes  . Special: Marijuana    Comment: once a month    History   Social History  . Marital Status: Single    Spouse Name: N/A    Number of Children: N/A  . Years of Education: N/A   Social History Main Topics  . Smoking status: Current Every Day Smoker -- 0.50 packs/day for 2 years  . Smokeless tobacco: Never Used  . Alcohol Use: 0.6 oz/week    1 Shots of liquor per week     Comment: occasionally/ maybe once a month  . Drug Use: Yes    Special: Marijuana  Comment: once a month  . Sexual Activity: Yes    Birth Control/ Protection: None   Other Topics Concern  . None   Social History Narrative   Additional Social History:    Pain Medications: NA Prescriptions: denies abuse /see pTA Over the Counter: see PTA History of alcohol / drug use?: No history of alcohol / drug abuse  Musculoskeletal: Strength &  Muscle Tone: within normal limits Gait & Station: normal Patient leans: N/A  Psychiatric Specialty Exam: Physical Exam  Constitutional: He appears well-developed.  Disheveled  HENT:  Head: Normocephalic.  Eyes: Pupils are equal, round, and reactive to light.  Cardiovascular: Normal rate and regular rhythm.   Respiratory: Effort normal and breath sounds normal.  GI: Soft. Bowel sounds are normal.  Musculoskeletal: Normal range of motion.  Neurological: He is alert.  Skin: Skin is warm and dry.  Psychiatric: His affect is inappropriate. His speech is tangential. Thought content is paranoid and delusional. Cognition and memory are impaired. He expresses inappropriate judgment.    Review of Systems  Constitutional: Negative.   HENT: Negative.   Eyes: Negative.   Respiratory: Negative.   Cardiovascular: Negative.   Gastrointestinal: Negative.   Genitourinary: Negative.   Musculoskeletal: Negative.   Skin: Negative.   Neurological: Negative.   Endo/Heme/Allergies: Negative.   Psychiatric/Behavioral: Positive for depression, hallucinations and substance abuse (Cannabis dependence). Negative for suicidal ideas and memory loss. The patient has insomnia. The patient is not nervous/anxious.     Blood pressure 129/83, pulse 102, temperature 97.6 F (36.4 C), resp. rate 18, height 6' (1.829 m), weight 76.204 kg (168 lb).Body mass index is 22.78 kg/(m^2).  General Appearance: Disheveled and hair in dread locs  Eye Contact::  Fair  Speech:  Disorganized, tangential, illogical  Volume:  Increased  Mood:  Dysphoric  Affect:  Restricted  Thought Process:  Disorganized, Tangential and thought blocking  Orientation:  Other:  oriented to person & place  Thought Content:  Delusions, Paranoid Ideation and responding to some internal stimuli  Suicidal Thoughts:  No  Homicidal Thoughts:  No  Memory:  Immediate;   Fair Recent;   Fair Remote;   Poor  Judgement:  Impaired  Insight:  Present and  patient aware that his thoughts & speach are not right  Psychomotor Activity:  Restless mind  Concentration:  Fair  Recall:  Poor  Fund of Knowledge:Fair  Language: Fair  Akathisia:  No  Handed:  Right  AIMS (if indicated):     Assets:  Desire for Improvement Physical Health  ADL's:  Impaired  Cognition: Impaired,  Moderate  Sleep:  Number of Hours: 6.75   Risk to Self: Is patient at risk for suicide?: No  Risk to Others: No  Prior Inpatient Therapy: Yes  Prior Outpatient Therapy: Yes  Alcohol Screening: 1. How often do you have a drink containing alcohol?: Monthly or less 2. How many drinks containing alcohol do you have on a typical day when you are drinking?: 1 or 2 3. How often do you have six or more drinks on one occasion?: Never Preliminary Score: 0 9. Have you or someone else been injured as a result of your drinking?: No 10. Has a relative or friend or a doctor or another health worker been concerned about your drinking or suggested you cut down?: No Alcohol Use Disorder Identification Test Final Score (AUDIT): 1 Brief Intervention: Patient declined brief intervention  Allergies:   Allergies  Allergen Reactions  . Shrimp [Shellfish Allergy] Anaphylaxis  Lab Results:  Results for orders placed or performed during the hospital encounter of 10/17/14 (from the past 48 hour(s))  Urine Drug Screen     Status: Abnormal   Collection Time: 10/17/14  9:01 AM  Result Value Ref Range   Opiates NONE DETECTED NONE DETECTED   Cocaine NONE DETECTED NONE DETECTED   Benzodiazepines NONE DETECTED NONE DETECTED   Amphetamines NONE DETECTED NONE DETECTED   Tetrahydrocannabinol POSITIVE (A) NONE DETECTED   Barbiturates NONE DETECTED NONE DETECTED    Comment:        DRUG SCREEN FOR MEDICAL PURPOSES ONLY.  IF CONFIRMATION IS NEEDED FOR ANY PURPOSE, NOTIFY LAB WITHIN 5 DAYS.        LOWEST DETECTABLE LIMITS FOR URINE DRUG SCREEN Drug Class       Cutoff (ng/mL) Amphetamine       1000 Barbiturate      200 Benzodiazepine   798 Tricyclics       921 Opiates          300 Cocaine          300 THC              50   Acetaminophen level     Status: Abnormal   Collection Time: 10/17/14  9:05 AM  Result Value Ref Range   Acetaminophen (Tylenol), Serum <10.0 (L) 10 - 30 ug/mL    Comment:        THERAPEUTIC CONCENTRATIONS VARY SIGNIFICANTLY. A RANGE OF 10-30 ug/mL MAY BE AN EFFECTIVE CONCENTRATION FOR MANY PATIENTS. HOWEVER, SOME ARE BEST TREATED AT CONCENTRATIONS OUTSIDE THIS RANGE. ACETAMINOPHEN CONCENTRATIONS >150 ug/mL AT 4 HOURS AFTER INGESTION AND >50 ug/mL AT 12 HOURS AFTER INGESTION ARE OFTEN ASSOCIATED WITH TOXIC REACTIONS.   CBC     Status: None   Collection Time: 10/17/14  9:05 AM  Result Value Ref Range   WBC 5.1 4.0 - 10.5 K/uL   RBC 5.19 4.22 - 5.81 MIL/uL   Hemoglobin 15.7 13.0 - 17.0 g/dL   HCT 45.2 39.0 - 52.0 %   MCV 87.1 78.0 - 100.0 fL   MCH 30.3 26.0 - 34.0 pg   MCHC 34.7 30.0 - 36.0 g/dL   RDW 12.2 11.5 - 15.5 %   Platelets 246 150 - 400 K/uL  Comprehensive metabolic panel     Status: None   Collection Time: 10/17/14  9:05 AM  Result Value Ref Range   Sodium 139 135 - 145 mmol/L   Potassium 3.9 3.5 - 5.1 mmol/L   Chloride 104 96 - 112 mmol/L   CO2 30 19 - 32 mmol/L   Glucose, Bld 96 70 - 99 mg/dL   BUN 13 6 - 23 mg/dL   Creatinine, Ser 0.91 0.50 - 1.35 mg/dL   Calcium 9.4 8.4 - 10.5 mg/dL   Total Protein 7.4 6.0 - 8.3 g/dL   Albumin 4.5 3.5 - 5.2 g/dL   AST 23 0 - 37 U/L   ALT 23 0 - 53 U/L   Alkaline Phosphatase 77 39 - 117 U/L   Total Bilirubin 0.9 0.3 - 1.2 mg/dL   GFR calc non Af Amer >90 >90 mL/min   GFR calc Af Amer >90 >90 mL/min    Comment: (NOTE) The eGFR has been calculated using the CKD EPI equation. This calculation has not been validated in all clinical situations. eGFR's persistently <90 mL/min signify possible Chronic Kidney Disease.    Anion gap 5 5 - 15  Ethanol (ETOH)  Status: None   Collection  Time: 10/17/14  9:05 AM  Result Value Ref Range   Alcohol, Ethyl (B) <5 0 - 9 mg/dL    Comment:        LOWEST DETECTABLE LIMIT FOR SERUM ALCOHOL IS 11 mg/dL FOR MEDICAL PURPOSES ONLY   Salicylate level     Status: None   Collection Time: 10/17/14  9:05 AM  Result Value Ref Range   Salicylate Lvl <0.2 2.8 - 20.0 mg/dL   Current Medications: Current Facility-Administered Medications  Medication Dose Route Frequency Provider Last Rate Last Dose  . acetaminophen (TYLENOL) tablet 650 mg  650 mg Oral Q4H PRN Waylan Boga, NP      . alum & mag hydroxide-simeth (MAALOX/MYLANTA) 200-200-20 MG/5ML suspension 30 mL  30 mL Oral Q4H PRN Waylan Boga, NP      . ibuprofen (ADVIL,MOTRIN) tablet 600 mg  600 mg Oral Q8H PRN Waylan Boga, NP      . LORazepam (ATIVAN) tablet 1 mg  1 mg Oral Q8H PRN Waylan Boga, NP      . magnesium hydroxide (MILK OF MAGNESIA) suspension 30 mL  30 mL Oral Daily PRN Waylan Boga, NP      . nicotine (NICODERM CQ - dosed in mg/24 hours) patch 21 mg  21 mg Transdermal Daily Waylan Boga, NP      . ondansetron (ZOFRAN) tablet 4 mg  4 mg Oral Q8H PRN Waylan Boga, NP      . risperiDONE (RISPERDAL) tablet 1 mg  1 mg Oral BID Waylan Boga, NP   1 mg at 10/17/14 2244  . traZODone (DESYREL) tablet 50 mg  50 mg Oral QHS PRN,MR X 1 Evanna Cori Burkett, NP   50 mg at 10/17/14 2244  . zolpidem (AMBIEN) tablet 5 mg  5 mg Oral QHS PRN Waylan Boga, NP       PTA Medications: Prescriptions prior to admission  Medication Sig Dispense Refill Last Dose  . benztropine (COGENTIN) 0.5 MG tablet Take 1 tablet (0.5 mg total) by mouth at bedtime. (Patient not taking: Reported on 10/17/2014) 30 tablet 0 Not Taking  . divalproex (DEPAKOTE) 250 MG DR tablet Take 1 tablet (250 mg total) by mouth every 12 (twelve) hours. (Patient not taking: Reported on 10/17/2014) 60 tablet 0 Not Taking  . risperiDONE (RISPERDAL) 0.5 MG tablet Take 5 tablets (2.5 mg total) by mouth at bedtime. (Patient not taking:  Reported on 10/17/2014) 150 tablet 0 Not Taking  . traZODone (DESYREL) 50 MG tablet Take 1 tablet (50 mg total) by mouth at bedtime as needed for sleep. For insomnia (Patient not taking: Reported on 10/17/2014) 30 tablet 0 Not Taking    Previous Psychotropic Medications: Yes   Substance Abuse History in the last 12 months:  Yes.   (Cannabis)  Consequences of Substance Abuse: Medical Consequences:  Liver damage, Possible death by overdose Legal Consequences:  Arrests, jail time, Loss of driving privilege. Family Consequences:  Family discord, divorce and or separation.  Results for orders placed or performed during the hospital encounter of 10/17/14 (from the past 72 hour(s))  Urine Drug Screen     Status: Abnormal   Collection Time: 10/17/14  9:01 AM  Result Value Ref Range   Opiates NONE DETECTED NONE DETECTED   Cocaine NONE DETECTED NONE DETECTED   Benzodiazepines NONE DETECTED NONE DETECTED   Amphetamines NONE DETECTED NONE DETECTED   Tetrahydrocannabinol POSITIVE (A) NONE DETECTED   Barbiturates NONE DETECTED NONE DETECTED    Comment:  DRUG SCREEN FOR MEDICAL PURPOSES ONLY.  IF CONFIRMATION IS NEEDED FOR ANY PURPOSE, NOTIFY LAB WITHIN 5 DAYS.        LOWEST DETECTABLE LIMITS FOR URINE DRUG SCREEN Drug Class       Cutoff (ng/mL) Amphetamine      1000 Barbiturate      200 Benzodiazepine   818 Tricyclics       299 Opiates          300 Cocaine          300 THC              50   Acetaminophen level     Status: Abnormal   Collection Time: 10/17/14  9:05 AM  Result Value Ref Range   Acetaminophen (Tylenol), Serum <10.0 (L) 10 - 30 ug/mL    Comment:        THERAPEUTIC CONCENTRATIONS VARY SIGNIFICANTLY. A RANGE OF 10-30 ug/mL MAY BE AN EFFECTIVE CONCENTRATION FOR MANY PATIENTS. HOWEVER, SOME ARE BEST TREATED AT CONCENTRATIONS OUTSIDE THIS RANGE. ACETAMINOPHEN CONCENTRATIONS >150 ug/mL AT 4 HOURS AFTER INGESTION AND >50 ug/mL AT 12 HOURS AFTER INGESTION ARE OFTEN  ASSOCIATED WITH TOXIC REACTIONS.   CBC     Status: None   Collection Time: 10/17/14  9:05 AM  Result Value Ref Range   WBC 5.1 4.0 - 10.5 K/uL   RBC 5.19 4.22 - 5.81 MIL/uL   Hemoglobin 15.7 13.0 - 17.0 g/dL   HCT 45.2 39.0 - 52.0 %   MCV 87.1 78.0 - 100.0 fL   MCH 30.3 26.0 - 34.0 pg   MCHC 34.7 30.0 - 36.0 g/dL   RDW 12.2 11.5 - 15.5 %   Platelets 246 150 - 400 K/uL  Comprehensive metabolic panel     Status: None   Collection Time: 10/17/14  9:05 AM  Result Value Ref Range   Sodium 139 135 - 145 mmol/L   Potassium 3.9 3.5 - 5.1 mmol/L   Chloride 104 96 - 112 mmol/L   CO2 30 19 - 32 mmol/L   Glucose, Bld 96 70 - 99 mg/dL   BUN 13 6 - 23 mg/dL   Creatinine, Ser 0.91 0.50 - 1.35 mg/dL   Calcium 9.4 8.4 - 10.5 mg/dL   Total Protein 7.4 6.0 - 8.3 g/dL   Albumin 4.5 3.5 - 5.2 g/dL   AST 23 0 - 37 U/L   ALT 23 0 - 53 U/L   Alkaline Phosphatase 77 39 - 117 U/L   Total Bilirubin 0.9 0.3 - 1.2 mg/dL   GFR calc non Af Amer >90 >90 mL/min   GFR calc Af Amer >90 >90 mL/min    Comment: (NOTE) The eGFR has been calculated using the CKD EPI equation. This calculation has not been validated in all clinical situations. eGFR's persistently <90 mL/min signify possible Chronic Kidney Disease.    Anion gap 5 5 - 15  Ethanol (ETOH)     Status: None   Collection Time: 10/17/14  9:05 AM  Result Value Ref Range   Alcohol, Ethyl (B) <5 0 - 9 mg/dL    Comment:        LOWEST DETECTABLE LIMIT FOR SERUM ALCOHOL IS 11 mg/dL FOR MEDICAL PURPOSES ONLY   Salicylate level     Status: None   Collection Time: 10/17/14  9:05 AM  Result Value Ref Range   Salicylate Lvl <3.7 2.8 - 20.0 mg/dL    Observation Level/Precautions:  15 minute checks  Laboratory:  Per ED  Psychotherapy: Group sessions    Medications: See medication lists   Consultations: As needed   Discharge Concerns: Mood stability  Estimated LOS: 5-7 days  Other:     Psychological Evaluations: Yes   Treatment Plan  Summary: Daily contact with patient to assess and evaluate symptoms and progress in treatment, Medication management and Treatment Plan/Recommendations: 1. Admit for crisis management and stabilization, estimated length of stay 3-5 days.   2. Medication management to reduce current symptoms to base line and improve the patient's overall level of functioning  3. Treat health problems as indicated.  4. Develop treatment plan to decrease risk of relapse upon discharge and the need for readmission.  5. Psycho-social education regarding relapse prevention and self care.  6. Health care follow up as needed for medical problems.  7. Review, reconcile, and reinstate any pertinent home medications for other health issues where appropriate. 8. Call for consults with hospitalist for any additional specialty patient care services as needed.  Medical Decision Making:  New problem, with additional work up planned, Review of Psycho-Social Stressors (1), Review or order clinical lab tests (1), Review and summation of old records (2), Review of Medication Regimen & Side Effects (2) and Review of New Medication or Change in Dosage (2)  I certify that inpatient services furnished can reasonably be expected to improve the patient's condition.   Encarnacion Slates, PMHNP-BC 1/30/20169:58 AM I personally assessed the patient, reviewed the physical exam and labs and formulated the treatment plan Geralyn Flash A. Sabra Heck, M.D.

## 2014-10-18 NOTE — Progress Notes (Signed)
D) Pt has been in his room sleeping much of the shift sleeping. States he does not want to attend groups because "I am tired". Affect is tense and irritable. Pt does not want to engage in the milieu presently. States he is "ok" but is paranoid and leary of others. Has gone down for meals today A) Pt given his space and allowed to rest today. Not pushed in any direction. Allowing medications to get on board so that Pt will be able to attend and participate in the program. R) Pt remains withdrawn and seclusive.

## 2014-10-18 NOTE — Progress Notes (Signed)
Goals Group Note  Date:  10/18/2014 Time:  0930  Group Topic/Focus:  Identifying Needs:   The focus of this group is to help patients identify their daily goal and to help them make a list of strategies that will help them obtain their goal/ Participation Level:  N/A  Participation Quality: N/A   Affect:  N/A  Cognitive:  N/A  Insight:  N/A  Engagement in Group:  N/A  Additional Comments:  N/A  10/18/2014,10:12 AM Rich Braveuke, Zettie Gootee Lynn

## 2014-10-18 NOTE — Progress Notes (Signed)
The focus of this group is to help patients review their daily goal of treatment and discuss progress on daily workbooks. Pt did not attend. 

## 2014-10-18 NOTE — [facility] Group Notes (Signed)
[facility] Group Notes: (Clinical Social Work)   10/18/2014      Type of Therapy:  Group Therapy   Participation Level:  Did Not Attend - was invited by [facility], but did not come   Ambrose MantleMareida Grossman-Orr, LCSW 10/18/2014, 2:29 PM

## 2014-10-18 NOTE — Progress Notes (Signed)
Psychoeducational Group Note  Date:  10/18/2014 Time:  0010  Group Topic/Focus:  Wrap-Up Group:   The focus of this group is to help patients review their daily goal of treatment and discuss progress on daily workbooks.  Participation Level: Did Not Attend  Participation Quality:  Not Applicable  Affect:  Not Applicable  Cognitive:  Not Applicable  Insight:  Not Applicable  Engagement in Group: Not Applicable  Additional Comments:  The patient did not attend group last evening since he was admitted to the hallway after the group ended.   Parneet Glantz S 10/18/2014, 12:10 AM

## 2014-10-18 NOTE — Progress Notes (Signed)
Pt resting in bed, eyes closed, breathing even and unlabored. No distress noted. Q15 min safety checks maintained. Will continue to monitor pt.
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
## 2014-10-19 NOTE — Progress Notes (Signed)
D) Pt has been in his room all shift. Has not attended any of the groups today. Has stayed in his room  And will masturbate. Laughs to himself. Is withdrawn and isolative. Does go to the cafeteria, but sits alone. Does not interact with others. Pt will answer questions but does not initiate  Conversation. Denies SI and HI. Rates his depression, hopelessness and helplessness all at a 0. A) Given support by not pushing Pt at all. Pt allowed to stay in his room and then come out for meals. Short frequent contact with Pt.  R) Pt remains withdrawn to his room, coming out for meals only.

## 2014-10-19 NOTE — Progress Notes (Signed)
Goals  Group Note  Date:  10/19/2014 Time :   0930  Group Topic/Focus: The focus of this group is to help patients set daily goals they can work towards and help them list strategies that may be helpful in obtaining their goals.  IParticipation Level: little  Participation Quality: fair  Affect: flat  Cognitive:  ok  Insight:  minimal  Engagement in Group: some  Additional Comments:  PD RN [facility]

## 2014-10-19 NOTE — Progress Notes (Signed)
[facility] MD Progress Note  10/19/2014 6:00 PM Connelly [place]  MRN:  [id number] Subjective:  Vincent [name] has evidenced some liability. I spoke with his mother who is concerned about his compliance. States he has said before he is going to take his medications be compliant with treatment but then he does not do it. She states that if the drugs triggered the psychosis she would like him to go to a substance abuse program but understands the psychosis has to be controlled first. She also states he complains about side effects. She feels it might be the Depakote causing the side effects he describes as he had taken Risperdal before and not complain of those side effects.  Kyair keep asking when is he going to be D/C, later come back and states "I want to be D/C now" but able to leave to go to the cafeteria  Principal Problem: <principal problem not specified> Diagnosis:   Patient Active Problem List   Diagnosis Date Noted  . Schizophrenia, unspecified type [F20.9] 10/17/2014  . Schizophrenia [F20.9] 10/17/2014  . 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 Cannabis use disorder, severe, dependence [F12.20]   . Episodic mood disorder [F39] 12/06/2013  . Cannabis dependence [F12.20] 12/03/2013  . 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 Psychosis [F29] 12/02/2013   Total Time spent with patient: 30 minutes   Past Medical History:  Past Medical History  Diagnosis Date  . Anxiety     Past Surgical History  Procedure Laterality Date  . Mandible surgery    . Wrist surgery      laceration by glass that pt sts was closed with surgery   Family History: History reviewed. No pertinent family history. Social History:  History   Alcohol Use  . 0.6 oz/week  . 1 Shots of liquor per week    Comment: occasionally/ maybe once a month     History  Drug Use  . Yes  . Special: Marijuana    Comment: once a month    History   Social History  . Marital Status: Single    Spouse Name: N/A    Number of Children: N/A  . Years of Education: N/A   Social History Main Topics  . Smoking status: Current Every Day Smoker -- 0.50 packs/day for 2 years  . Smokeless tobacco: Never Used  . Alcohol Use: 0.6 oz/week    1 Shots of liquor per week     Comment: occasionally/ maybe once a month  . Drug Use: Yes    Special: Marijuana     Comment: once a month  . Sexual Activity: Yes    Birth Control/ Protection: None   Other Topics Concern  . None   Social History Narrative   Additional History:    Sleep: Fair  Appetite:  Fair   Assessment:   Musculoskeletal: Strength & Muscle Tone: within normal limits Gait & Station: normal Patient leans: N/A   Psychiatric Specialty Exam: Physical Exam  Review of Systems  Constitutional: Negative.   HENT: Negative.   Eyes: Negative.   Respiratory: Negative.   Cardiovascular: Negative.   Gastrointestinal: Negative.   Genitourinary: Negative.   Musculoskeletal: Negative.   Skin: Negative.   Endo/Heme/Allergies: Negative.   Psychiatric/Behavioral: Positive for substance abuse. The patient is nervous/anxious.  Blood pressure 141/68, pulse 84, temperature 98.3 F (36.8 C), temperature source Oral, resp. rate 18, height 6' (1.829 m), weight 76.204 kg (168 lb), SpO2 100 %.Body mass index is 22.78 kg/(m^2).  General Appearance: Fairly Groomed  Patent attorneyye Contact::  Fair  Speech:  Clear and Coherent and Pressured  Volume:  fluctuates  Mood:  Anxious, Depressed, Irritable and worried  Affect:  Restricted  Thought Process:  Coherent and Goal Directed  Orientation:  Full (Time, Place, and Person)  Thought Content:  symptoms events worries concerns, wanting to be D/C   Suicidal Thoughts:  No  Homicidal Thoughts:  No  Memory:  Immediate;   Poor Recent;   Poor Remote;   Poor  Judgement:  Impaired  Insight:  Lacking  Psychomotor Activity:  Restlessness  Concentration:  Poor  Recall:  Fair  Fund of Knowledge:Poor  Language: Fair  Akathisia:  No  Handed:  Right  AIMS (if indicated):     Assets:  Desire for Improvement Housing Social Support  ADL's:  Intact  Cognition: WNL  Sleep:  Number of Hours: 6.75     Current Medications: Current Facility-Administered Medications  Medication Dose Route Frequency Provider Last Rate Last Dose  . acetaminophen (TYLENOL) tablet 650 mg  650 mg Oral Q4H PRN Nanine MeansJamison Lord, NP      . alum & mag hydroxide-simeth (MAALOX/MYLANTA) 200-200-20 MG/5ML suspension 30 mL  30 mL Oral Q4H PRN Nanine MeansJamison Lord, NP      . divalproex (DEPAKOTE ER) 24 hr tablet 250 mg  250 mg Oral BID Rachael FeeIrving A Cathren Sween, MD   250 mg at 10/18/14 1720  . ibuprofen (ADVIL,MOTRIN) tablet 600 mg  600 mg Oral Q8H PRN Nanine MeansJamison Lord, NP      . LORazepam (ATIVAN) tablet 1 mg  1 mg Oral Q8H PRN Nanine MeansJamison Lord, NP      . magnesium hydroxide (MILK OF MAGNESIA) suspension 30 mL  30 mL Oral Daily PRN Nanine MeansJamison Lord, NP      . nicotine (NICODERM CQ - dosed in mg/24 hours) patch 21 mg  21 mg Transdermal Daily Nanine MeansJamison Lord, NP   21 mg at 10/19/14 0837  . ondansetron (ZOFRAN) tablet 4 mg  4 mg Oral Q8H PRN Nanine MeansJamison Lord, NP      . risperiDONE (RISPERDAL) tablet 1 mg  1 mg Oral BID Nanine MeansJamison Lord, NP   1 mg at 10/19/14 0836  . traZODone (DESYREL) tablet 50 mg  50 mg Oral QHS PRN,MR X 1 Evanna Cori Burkett, NP   50 mg at 10/17/14 2244  . zolpidem (AMBIEN) tablet 5 mg  5 mg Oral QHS PRN Nanine MeansJamison Lord, NP        Lab Results: No results found for this or any previous visit (from the past 48 hour(s)).  Physical Findings: AIMS: Facial and Oral Movements Muscles of Facial Expression: None, normal Lips and Perioral Area: None, normal Jaw: None, normal Tongue: None, normal,Extremity  Movements Upper (arms, wrists, hands, fingers): None, normal Lower (legs, knees, ankles, toes): None, normal, Trunk Movements Neck, shoulders, hips: None, normal, Overall Severity Severity of abnormal movements (highest score from questions above): None, normal Incapacitation due to abnormal movements: None, normal Patient's awareness of abnormal movements (rate only patient's report): No Awareness, Dental Status Current problems with teeth and/or dentures?: No Does patient usually wear dentures?: No  CIWA:    COWS:     Treatment Plan Summary: Daily contact with patient to assess and evaluate symptoms and progress in treatment and Medication management Supportive  approach/coping skills/relapse prevention Psychosis, mood instability: will continue the Risperdal/Depakote combination paying attention to side effects coming from the Depakote as mother suggested  Medical Decision Making:  Review of Psycho-Social Stressors (1), Review or order clinical lab tests (1) and Review of Medication Regimen & Side Effects (2)     Geraldine Sandberg A 10/19/2014, 6:00 PM

## 2014-10-19 NOTE — [facility] Group Notes (Signed)
[facility] Group Notes: (Clinical Social Work)   10/19/2014      Type of Therapy:  Group Therapy   Participation Level:  Did Not Attend despite being asked by CSW and MHT   Ambrose MantleMareida Grossman-Orr, LCSW 10/19/2014, 2:41 PM

## 2014-10-19 NOTE — Progress Notes (Signed)
Psychoeducational Group Note  Date:  10/19/2014 Time:  1330  Group Topic/Focus:  Emotional Education:   The focus of this group is to discuss what feelings/emotions are, and how they are experienced.  Participation Level: Did Not Attend  Participation Quality:  Not Applicable  Affect:  Not Applicable  Cognitive:  Not Applicable  Insight:  Not Applicable  Engagement in Group: Not Applicable  Additional Comments:    Rich BraveDuke, Royer Cristobal Lynn 10/19/2014, 2:44 PM

## 2014-10-19 NOTE — Progress Notes (Signed)
Pt was out on the hall,  Was pleasant and cooperative, voiced no c/o's.  Denied voices, denied thoughts of self harm.  Made a phone call to his god-mother, [name], and told her to come and pick him up, that he was ready to go home.  She called and asked if he was ready to be picked up and I explained that he would need a doctor's order to be able to leave, and that no order was in place.  I also went back to talk to [place] Beach[place]ney and to let him know he couldn't leave without an order. Encouraged him to talk to his MD in am about discharge.  He began cursing and slammed the bathroom door but turned out the light and got into bed, was still cursing but settled himself into bed. Has gone to sleep without further issue, currently resting quietly. A)  Will continue to monitor for safety, continue POC R)  Safety maintained.

## 2014-10-20 DIAGNOSIS — F12951 Cannabis use, unspecified with psychotic disorder with hallucinations: Secondary | ICD-10-CM | POA: Diagnosis present

## 2014-10-20 DIAGNOSIS — F1220 Cannabis dependence, uncomplicated: Secondary | ICD-10-CM | POA: Diagnosis present

## 2014-10-20 DIAGNOSIS — F1994 Other psychoactive substance use, unspecified with psychoactive substance-induced mood disorder: Secondary | ICD-10-CM | POA: Diagnosis present

## 2014-10-20 MED ORDER — GABAPENTIN 100 MG PO CAPS
200.0000 mg | ORAL_CAPSULE | ORAL | Status: DC
  Administered 2014-10-20 – 2014-10-22 (×5): 200 mg via ORAL
  Filled 2014-10-20 (×8): qty 2
  Filled 2014-10-20: qty 84
  Filled 2014-10-20 (×6): qty 2
  Filled 2014-10-20 (×2): qty 84

## 2014-10-20 NOTE — [facility] Group Notes (Signed)
[facility] Dis[facility] Planning [facility] Note   10/20/2014 10:01 AM  Participation Quality:  Invited.Dimas Alexandria. Chose to not attend   [place], Johnatha B

## 2014-10-20 NOTE — Progress Notes (Signed)
Pt took his risperdal this morning but refused his depakote stated,"it messes with my nuts" pt did take and did not cheek spoke with pt in depth to ensure he swallowed his medications.  Dr. Elna BreslowEappen is aware. He was started on gabapentin and had his first dose around 1440. Pt refused to fill out or answer some of the questions on his self-inventory.  He did state his "depression would be 10 if he was not allowed to leave today".  Pt did sign 72 hour request for discharge today at 1045 per Dr. Elna BreslowEappen suggestion.  Pt is still wanting to leave but has remained calm thus far.  Pt's mother Karis Jubaakisha Mills called to check on her son her phone number [phone].  She stated,"call me if he needs anything or if I need to know something please call me"  We do have permission to talk with her she is concerned that her son really needs help with substance abuse. Pt's UDS + for THC no other substance at this admission.  Pt denies any A/V/H or S/I/H/I.  Pt did take his 1700 medication 1 mg risperdal and pt did not try to cheek. and once again refused the depakote for the same reason.

## 2014-10-20 NOTE — [facility] Group Notes (Signed)
[facility] LCSW Group Therapy  10/20/2014 1:15 pm  Type of Therapy: Process Group Therapy  Participation Level:  Invited.  Chose to not attend  Summary of Progress/Problems: Today's group addressed the issue of overcoming obstacles.  Patients were asked to identify their biggest obstacle post d/c that stands in the way of their on-going success, and then problem solve as to how to manage this.  Vincent [name], Vincent [name] 10/20/2014   3:22 PM
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
## 2014-10-20 NOTE — Progress Notes (Signed)
[facility]  10/20/2014 12:48 PM Vincent [name]  MRN:  [id number] Subjective: Patient states "I do not want to take my medications, I want to be discharged>'   Objective: Patient presented with psychosis as well as mood lability. Pt with hx of substance induced psychosis as well as substance abuse. Per Provider who saw him over the week end , pt with mood lability , mother concerned about non compliance issues. Mother also wants patient to be referred for substance abuse program. Pt seen this AM. Pt continues to have labile moods , very restless , minimal participation in therapeutic milieu. Patient only partially compliant on medications. Patient denies SI/HI/AH/VH. Pt very restless ,asking for DC. Pt was provided with 72 hr notice application which he signed.     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 Cannabis use disorder, severe, dependence [F12.20] 10/20/2014  . Other psychoactive substance-induced mood disorder [F19.94] 10/20/2014  . Schizophrenia, unspecified type [F20.9] 10/17/2014  . 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 Episodic mood disorder [F39] 12/06/2013  . Psychosis [F29] 12/02/2013   Total  Time spent with patient: 30 minutes   Past Medical History:  Past Medical History  Diagnosis Date  . Anxiety     Past Surgical History  Procedure Laterality Date  . Mandible surgery    . Wrist surgery      laceration by glass that pt sts was closed with surgery   Family History: History reviewed. No pertinent family history. Social History:  History  Alcohol Use  . 0.6 oz/week  . 1 Shots of liquor per week    Comment: occasionally/ maybe once a month     History  Drug Use  . Yes  . Special: Marijuana    Comment: once a month    History   Social History  . Marital Status: Single    Spouse Name: N/A    Number of Children: N/A  . Years of Education: N/A   Social History Main Topics  . Smoking status: Current Every Day Smoker -- 0.50 packs/day for 2 years  . Smokeless tobacco: Never Used  . Alcohol Use: 0.6 oz/week    1 Shots of liquor per week     Comment: occasionally/ maybe once a month  . Drug Use: Yes    Special: Marijuana     Comment: once a month  . Sexual Activity: Yes    Birth Control/ Protection: None   Other Topics Concern  . None   Social History Narrative   Additional History:    Sleep: Fair  Appetite:  Fair    Musculoskeletal: Strength & Muscle Tone: within normal limits Gait & Station: normal Patient leans: N/A   Psychiatric Specialty Exam: Physical Exam  Review of Systems  Psychiatric/Behavioral: Positive for substance abuse. Negative for depression, suicidal ideas and hallucinations. The patient is nervous/anxious.     Blood pressure  141/68, pulse 84, temperature 98.3 F (36.8 C), temperature source Oral, resp. rate 18, height 6' (1.829 m), weight 76.204 kg (168 lb), SpO2 100 %.Body mass index is 22.78 kg/(m^2).  General Appearance: Fairly Groomed  Patent attorney::  Fair  Speech:  Clear and Coherent and Pressured  Volume:  fluctuates  Mood:  Anxious, Depressed, Irritable and worried  Affect:  Restricted  Thought Process:   Coherent and Goal Directed  Orientation:  Full (Time, Place, and Person)  Thought Content:  symptoms events worries concerns, wanting to be D/C  Suicidal Thoughts:  No  Homicidal Thoughts:  No  Memory:  Immediate;   Poor Recent;   Poor Remote;   Poor  Judgement:  Impaired  Insight:  Lacking  Psychomotor Activity:  Restlessness  Concentration:  Poor  Recall:  Fair  Fund of Knowledge:Poor  Language: Fair  Akathisia:  No  Handed:  Right  AIMS (if indicated):     Assets:  Desire for Improvement Housing Social Support  ADL's:  Intact  Cognition: WNL  Sleep:  Number of Hours: 6.75     Current Medications: Current Facility-Administered Medications  Medication Dose Route Frequency Provider Last Rate Last Dose  . acetaminophen (TYLENOL) tablet 650 mg  650 mg Oral Q4H PRN Nanine Means, NP      . alum & mag hydroxide-simeth (MAALOX/MYLANTA) 200-200-20 MG/5ML suspension 30 mL  30 mL Oral Q4H PRN Nanine Means, NP      . divalproex (DEPAKOTE ER) 24 hr tablet 250 mg  250 mg Oral BID Rachael Fee, MD   250 mg at 10/18/14 1720  . gabapentin (NEURONTIN) capsule 200 mg  200 mg Oral [id number] Josalyn Dettmann, MD      . ibuprofen (ADVIL,MOTRIN) tablet 600 mg  600 mg Oral Q8H PRN Nanine Means, NP      . LORazepam (ATIVAN) tablet 1 mg  1 mg Oral Q8H PRN Nanine Means, NP      . magnesium hydroxide (MILK OF MAGNESIA) suspension 30 mL  30 mL Oral Daily PRN Nanine Means, NP      . nicotine (NICODERM CQ - dosed in mg/24 hours) patch 21 mg  21 mg Transdermal Daily Nanine Means, NP   21 mg at 10/19/14 0837  . ondansetron (ZOFRAN) tablet 4 mg  4 mg Oral Q8H PRN Nanine Means, NP      . risperiDONE (RISPERDAL) tablet 1 mg  1 mg Oral BID Nanine Means, NP   1 mg at 10/20/14 0836  . traZODone (DESYREL) tablet 50 mg  50 mg Oral QHS PRN,MR X 1 Evanna Cori Merry Proud, NP   50 mg at 10/17/14 2244    Lab Results: No results found for this or any previous visit (from the past 48 hour(s)).  Physical Findings: AIMS:  Facial and Oral Movements Muscles of Facial Expression: None, normal Lips and Perioral Area: None, normal Jaw: None, normal Tongue: None, normal,Extremity Movements Upper (arms, wrists, hands, fingers): None, normal Lower (legs, knees, ankles, toes): None, normal, Trunk Movements Neck, shoulders, hips: None, normal, Overall Severity Severity of abnormal movements (highest score from questions above): None, normal Incapacitation due to abnormal movements: None, normal Patient's awareness of abnormal movements (rate only patient's report): No Awareness, Dental Status Current problems with teeth and/or dentures?: No Does patient usually wear dentures?: No  CIWA:    COWS:      Assessment: Patient with cannabis as well as nyquil abuse as well as mood lability ,presents with restless ness, noncompliance on medications as  well as ? psychosis. Will continue current medications.   Treatment Plan Summary: Daily contact with patient to assess and evaluate symptoms and progress in treatment and Medication management Supportive approach/coping skills/relapse prevention Patient refusing to take Depakote due to SE concerns. Will continue Risperdal as scheduled. Will add Gabapentin 200 mg po tid for mood lability. Will continue Trazodone 50 mg po qhs for sleep. Will order EKG,TSH,Lipid panel,Hba1c. Will encourage pt to take his medications. CSW will work on referral to substance abuse program.   Medical Decision Making:  Review of Psycho-Social Stressors (1), Review or order clinical lab tests (1) and Review of Medication Regimen & Side Effects (2)     Jaxxon Naeem MD 10/20/2014, 12:48 PM

## 2014-10-20 NOTE — Progress Notes (Signed)
Psychoeducational Group Note  Date:  10/20/2014 Time:  0742  Group Topic/Focus:  Wrap-Up Group:   The focus of this group is to help patients review their daily goal of treatment and discuss progress on daily workbooks.  Participation Level: Did Not Attend  Participation Quality:  Not Applicable  Affect:  Not Applicable  Cognitive:  Not Applicable  Insight:  Not Applicable  Engagement in Group: Not Applicable  Additional Comments:  The patient did not attuned group last evening.   Hazle CocaGOODMAN, Rimsha Trembley S 10/20/2014, 7:42 AM

## 2014-10-20 NOTE — Plan of Care (Signed)
Problem: Alteration in thought process Goal: LTG-Patient verbalizes understanding importance med regimen (Patient verbalizes understanding of importance of medication regimen and need to continue outpatient care.)  Outcome: Not Progressing Pt is refusing some of his medications and is requesting discharge.  He did sign 72 hour request for discharge today at 1045 and Dr. Elna BreslowEappen is aware

## 2014-10-20 NOTE — Progress Notes (Signed)
D:Patient pacing in the hallway on approach.  Patient states he had a good day.  Patient states his goal for today was to be discharged.  Patient has blunted affect but pleasant.  Patient states he did not meet his goal today but hopes he can be discharged tomorrow.  Patient denies SI/HI and denies AVH. A: Staff to monitor Q 15 mins for safety.  Encouragement and support offered.  Scheduled medications administered per orders. R: Patient remains safe on the unit.  Patient did not attend group tonight.  Patient visible on the unit and interacting with peers.  Patient taking administered medicaitons.

## 2014-10-20 NOTE — Clinical Social Work Note (Signed)
[facility] to [facility], [phone], Shawon's symptoms and episodes are all substance induced. She knows that he has used xanax, molly, dab, and cannabis.  He has been staying with her, and she is willing to allow him to return there.  She has put a significant amount of structure into his life in order to make sure his drug use is diminished.  For instance, if he says he needs food or clothing or anything else, she will purchase it for him rather than giving him money, because she has learned that he will always trade cash for drugs. She believes he is doing a lot better because of this, and states that last March when he was first admitted, he was really zoned out due to both the drugs and the medication, but he has not been as bad since then since she has been in charge.  She initially asked for rehab, but when I told her I could not get him in, she asked that I refer him for an assessment for IOP.  I agreed.  She feels he is ready to come home any time the Dr is ready to release him.  "I will hold him accountable, but if he does not respond positively to everything I am doing, at least I will be able to say I did everything I could."

## 2014-10-21 DIAGNOSIS — F3189 Other bipolar disorder: Secondary | ICD-10-CM

## 2014-10-21 LAB — LIPID PANEL
Cholesterol: 138 mg/dL (ref 0–200)
HDL: 47 mg/dL (ref >39)
LDL Cholesterol: 63 mg/dL (ref 0–99)
Total CHOL/HDL Ratio: 2.9 RATIO
Triglycerides: 139 mg/dL (ref <150)
VLDL: 28 mg/dL (ref 0–40)

## 2014-10-21 LAB — TSH: TSH: 1.054 u[IU]/mL (ref 0.350–4.500)

## 2014-10-21 NOTE — Progress Notes (Signed)
D: Patient is alert and oriented. Pt's mood and affect is pleasant, silly at times, and blunted. Pt's eye contact is brief. Pt denies SI/HI and AVH today. Pt is attending groups. Pt denies depression, hopelessness, and anxiety. Pt reports his goal for the day is "be nice, be a sweet boy." Pt refused 0800am dose of depakote, pt states "it messes up my nut," referring to sexual dysfunction. Pt became agitated this afternoon d/t [facility], pt requests to contact Rod, LCSW, per Rod pt's ride will be here tomorrow at 1130am, pt made aware. A: Encouragement/Support provided to pt. Active listening by RN. Rod LCSW contacted by RN for information. PRN medication administered for agitation per providers orders (See MAR). Scheduled medications administered per providers orders (See MAR). 15 minute checks continued per protocol for patient safety.  R: Patient cooperative and receptive to nursing interventions. Pt remains safe.

## 2014-10-21 NOTE — Plan of Care (Signed)
Problem: Ineffective individual coping Goal: STG: Patient will remain free from self harm Outcome: Progressing Patient remains free from self harm. 15 minute checks continued per protocol for patient safety.   Problem: Diagnosis: Increased Risk For Suicide Attempt Goal: STG-Patient Will Attend All Groups On The Unit Outcome: Progressing Patient has attended all unit groups today.

## 2014-10-21 NOTE — Progress Notes (Signed)
D   Pt appears to be paranoid and guarded   Tried to do 1:1 with patient but pt was not receptive to same  He has been pacing back and forth from the dayroom to his room    A    Verbal support given   Medications administered and effectiveness monitored   Q 15 min checks R   Pt safe at present

## 2014-10-21 NOTE — Progress Notes (Signed)
The focus of this group is to help patients review their daily goal of treatment and discuss progress on daily workbooks. Pt attended the evening group session but responded minimally to discussion prompts from the Writer. Pt said he was "so fucking pissed" that he wasn't able to go home tomorrow, but that he would definitely be leaving tomorrow. "I'll be going somewhere tomorrow, even if they don't let me go." Pt appeared angry and disinterested in the group.

## 2014-10-21 NOTE — [facility] Group Notes (Signed)
[facility] LCSW Group Therapy  10/21/2014 , 1:04 PM   Type of Therapy:  Group Therapy  Participation Level:  Active  Participation Quality:  Attentive  Affect:  Appropriate  Cognitive:  Alert  Insight:  Improving  Engagement in Therapy:  Engaged  Modes of Intervention:  Discussion, Exploration and Socialization  Summary of Progress/Problems: Today's group focused on the term Diagnosis.  Participants were asked to define the term, and then pronounce whether it is a negative, positive or neutral term.  Stayed the entire time.  Was laughing at the beginning, and when asked why, he did not answer.  Another pt answered that she thought he was laughing at her "because he thinks I look funny."  Forunately, she was not taking it personally.  He stopped soon thereafter.  Did not offer anything spontaneously, and when questioned, either replied "I don't know" or shrugged his shoulders.  Daryel Geraldorth, Jamael B 10/21/2014 , 1:04 PM

## 2014-10-21 NOTE — Progress Notes (Signed)
[facility] MD Progress Note  10/21/2014 11:13 AM Rangel Echeverri  MRN:  [id number] Subjective: Patient states "I am OK."   Objective: Patient presented with psychosis as well as mood lability. Pt with hx of substance induced psychosis as well as substance abuse.  Patient today with more stable mood  , as well as is compliant on medications except for his Depakote. Patient is motivated to get help with his substance abuse. Patient denies any SI/HI/AH/VH.   Patient has signed 72 hr for DC. CSW will work on disposition as well as Referral to [facility].    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 Cannabis use disorder, severe, dependence [F12.20] 10/20/2014  . Other psychoactive substance-induced mood disorder [F19.94] 10/20/2014  . Schizophrenia, unspecified type [F20.9] 10/17/2014  . 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 Episodic mood disorder [F39] 12/06/2013  . Psychosis [F29] 12/02/2013   Total Time spent with patient: 30 minutes   Past Medical History:  Past Medical History  Diagnosis Date  . Anxiety     Past Surgical History  Procedure Laterality Date  . Mandible surgery    . Wrist surgery      laceration by glass  that pt sts was closed with surgery   Family History: History reviewed. No pertinent family history. Social History:  History  Alcohol Use  . 0.6 oz/week  . 1 Shots of liquor per week    Comment: occasionally/ maybe once a month     History  Drug Use  . Yes  . Special: Marijuana    Comment: once a month    History   Social History  . Marital Status: Single    Spouse Name: N/A    Number of Children: N/A  . Years of Education: N/A   Social History Main Topics  . Smoking status: Current Every Day Smoker -- 0.50 packs/day for 2 years  . Smokeless tobacco: Never Used  . Alcohol Use: 0.6 oz/week    1 Shots of liquor per week     Comment: occasionally/ maybe once a month  . Drug Use: Yes    Special: Marijuana     Comment: once a month  . Sexual Activity: Yes    Birth Control/ Protection: None   Other Topics Concern  . None   Social History Narrative   Additional History:    Sleep: Fair  Appetite:  Fair    Musculoskeletal: Strength & Muscle Tone: within normal limits Gait & Station: normal Patient leans: N/A   Psychiatric Specialty Exam: Physical Exam  ROS  Blood pressure 128/64, pulse 80, temperature 98.4 F (36.9 C), temperature source Oral, resp. rate 18, height 6' (1.829 m), weight 76.204 kg (168 lb), SpO2 100 %.Body mass index is 22.78 kg/(m^2).  General Appearance: Fairly Groomed  Patent attorney::  Fair  Speech:  Clear and Coherent and Pressured  Volume:  fluctuates  Mood:  Anxious, Depressed, Irritable and worried IMPROVING  Affect:  Restricted  Thought Process:  Coherent and Goal Directed  Orientation:  Full (Time, Place, and Person)  Thought Content:  linear ,fixed on DC  Suicidal Thoughts:  No  Homicidal Thoughts:  No  Memory:  Immediate;   Fair Recent;   Fair Remote;   Fair  Judgement:  Impaired  Insight:  Shallow  Psychomotor Activity:  Restlessness IMPROVING  Concentration:  Fair  Recall:  [employer] of Knowledge:Poor  Language: Fair   Akathisia:  No  Handed:  Right  AIMS (if indicated):     Assets:  Desire for Improvement Housing Social Support  ADL's:  Intact  Cognition: WNL  Sleep:  Number of Hours: 6.75     Current Medications: Current Facility-Administered Medications  Medication Dose Route Frequency Provider Last Rate Last Dose  . acetaminophen (TYLENOL) tablet 650 mg  650 mg Oral Q4H PRN Nanine Means, NP      . alum & mag hydroxide-simeth (MAALOX/MYLANTA) 200-200-20 MG/5ML suspension 30 mL  30 mL Oral Q4H PRN Nanine Means, NP      . gabapentin (NEURONTIN) capsule 200 mg  200 mg Oral [id number] Jomarie Longs, MD   200 mg at 10/21/14 0805  . ibuprofen (ADVIL,MOTRIN) tablet 600 mg  600 mg Oral Q8H PRN Nanine Means, NP      . LORazepam (ATIVAN) tablet 1 mg  1 mg Oral Q8H PRN Nanine Means, NP      . magnesium hydroxide (MILK OF MAGNESIA) suspension 30 mL  30 mL Oral Daily PRN Nanine Means, NP      . nicotine (NICODERM CQ - dosed in mg/24 hours) patch 21 mg  21 mg Transdermal Daily Nanine Means, NP   21 mg at 10/19/14 0837  . ondansetron (ZOFRAN) tablet 4 mg  4 mg Oral Q8H PRN Nanine Means, NP      . risperiDONE (RISPERDAL) tablet 1 mg  1 mg Oral BID Nanine Means, NP   1 mg at 10/21/14 0805  . traZODone (DESYREL) tablet 50 mg  50 mg Oral QHS PRN,MR X 1 Evanna Janann August, NP   50 mg at 10/20/14 2115    Lab Results:  Results for orders placed or performed during the hospital encounter of 10/17/14 (from the past 48 hour(s))  TSH     Status: None   Collection Time: 10/21/14  6:15 AM  Result Value Ref Range   TSH 1.054 0.350 - 4.500 uIU/mL    Comment: Performed at [facility]  Lipid panel     Status: None   Collection Time: 10/21/14  6:15 AM  Result Value Ref Range   Cholesterol 138 0 - 200 mg/dL   Triglycerides 295 <621 mg/dL   HDL 47 >30 mg/dL   Total CHOL/HDL Ratio 2.9 RATIO   VLDL 28 0 - 40 mg/dL   LDL Cholesterol 63 0 - 99 mg/dL    Comment:        Total Cholesterol/HDL:CHD Risk Coronary  Heart Disease Risk Table                     Men   Women  1/2 Average Risk   3.4   3.3  Average Risk       5.0   4.4  2 X Average Risk   9.6   7.1  3 X Average Risk  23.4   11.0        Use the calculated Patient Ratio above  and the CHD Risk Table to determine the patient's CHD Risk.        ATP III CLASSIFICATION (LDL):  <100     mg/dL   Optimal  [id number]  mg/dL   Near or Above                    Optimal  130-159  mg/dL   Borderline  [phone]  mg/dL   High  >956>190     mg/dL   Very High [facility]     Physical Findings: AIMS: Facial and Oral Movements Muscles of Facial Expression: None, normal Lips and Perioral Area: None, normal Jaw: None, normal Tongue: None, normal,Extremity Movements Upper (arms, wrists, hands, fingers): None, normal Lower (legs, knees, ankles, toes): None, normal, Trunk Movements Neck, shoulders, hips: None, normal, Overall Severity Severity of abnormal movements (highest score from questions above): None, normal Incapacitation due to abnormal movements: None, normal Patient's awareness of abnormal movements (rate only patient's report): No Awareness, Dental Status Current problems with teeth and/or dentures?: No Does patient usually wear dentures?: No  CIWA:    COWS:      Assessment: Patient with cannabis as well as nyquil abuse (PER HX) as well as mood lability ,presents with restless ness, noncompliance on medications as well as ? psychosis. Will continue current medications.   Treatment Plan Summary: Daily contact with patient to assess and evaluate symptoms and progress in treatment and Medication management Supportive approach/coping skills/relapse prevention Patient refusing to take Depakote due to SE concerns.Will DC depakote. Will continue Risperdal as scheduled. Will add Gabapentin 200 mg po tid for mood lability. Will continue Trazodone 50 mg po qhs for sleep. Labs reviewed - wnl. Will encourage pt to take his  medications. CSW will work on referral to substance abuse program - [facility].   Medical Decision Making:  Established Problem, Stable/Improving (1), Review of Psycho-Social Stressors (1), Review or order clinical lab tests (1), Review or order medicine tests (1) and Review of Medication Regimen & Side Effects (2)     Taras Rask MD 10/21/2014, 11:13 AM

## 2014-10-21 NOTE — [facility] Group Notes (Signed)
[facility] Group Notes:  (Nursing/MHT/Case Management/Adjunct)  Date:  10/21/2014  Time:  0930am  Type of Therapy:  Nurse Education  Participation Level:  Active  Participation Quality:  Appropriate and Attentive  Affect:  Blunted and Silly at times, with laughter at incongruent times  Cognitive:  Alert  Insight:  Lacking  Engagement in Group:  Engaged  Modes of Intervention:  Discussion, Education and Support  Summary of Progress/Problems: Patient attended group and remained engaged. Pt reports his goal for the day is "to become famous." When discussing today's topic of Recovery, pt denies being in recovery from anything.  Lendell CapriceGuthrie, Dalton Molesworth A 10/21/2014, 10:24 AM

## 2014-10-22 ENCOUNTER — Encounter (HOSPITAL_COMMUNITY): Payer: Self-pay | Admitting: Registered Nurse

## 2014-10-22 LAB — HEMOGLOBIN A1C
Hgb A1c MFr Bld: 5.6 % (ref 4.8–5.6)
MEAN PLASMA GLUCOSE: 114 mg/dL

## 2014-10-22 MED ORDER — TRAZODONE HCL 50 MG PO TABS: 50.0000 mg | ORAL_TABLET | Freq: Every evening | ORAL | Status: DC | PRN

## 2014-10-22 MED ORDER — GABAPENTIN 100 MG PO CAPS: 200.0000 mg | ORAL_CAPSULE | ORAL | Status: DC

## 2014-10-22 MED ORDER — RISPERIDONE 1 MG PO TABS: 1.0000 mg | ORAL_TABLET | Freq: Two times a day (BID) | ORAL | Status: DC

## 2014-10-22 MED ORDER — TRAZODONE HCL 50 MG PO TABS
50.0000 mg | ORAL_TABLET | Freq: Every evening | ORAL | Status: DC | PRN
Start: 2014-10-22 — End: 2014-10-22
  Filled 2014-10-22: qty 14

## 2014-10-22 NOTE — Tx Team (Signed)
  Interdisciplinary Treatment Plan Update   Date Reviewed:  10/22/2014  Time Reviewed:  10:21 AM  Progress in Treatment:   Attending groups: Yes Participating in groups: Yes Taking medication as prescribed: Yes  Tolerating medication: Yes Family/Significant other contact made: Yes  Patient understands diagnosis: Yes  Discussing patient identified problems/goals with staff: Yes  See initial care plan Medical problems stabilized or resolved: Yes Denies suicidal/homicidal ideation: Yes  In tx team Patient has not harmed self or others: Yes  For review of initial/current patient goals, please see plan of care.  Estimated Length of Stay:  D/C today  Reason for Continuation of Hospitalization:   New Problems/Goals identified:  N/A  Discharge Plan or Barriers:   return home, follow up [facility]  Additional Comments:  Attendees:  Signature: Ivin BootySarama Eappen, MD 10/22/2014 10:21 AM   Signature: Richelle Itood Verniece Encarnacion, LCSW 10/22/2014 10:21 AM  Signature: Assunta FoundShuvon Rankin, NP 10/22/2014 10:21 AM  Signature: Lincoln Maxinlivette, RN 10/22/2014 10:21 AM  Signature:  10/22/2014 10:21 AM  Signature:  10/22/2014 10:21 AM  Signature:   10/22/2014 10:21 AM  Signature:    Signature:    Signature:    Signature:    Signature:    Signature:      Scribe for Treatment Team:   [employer] Madeeha Costantino, LCSW  10/22/2014 10:21 AM

## 2014-10-22 NOTE — [facility] Group Notes (Signed)
[facility]   10/22/2014 12:38 PM  Participation Quality: Did not attend    Hyatt,Olivia Pavelko

## 2014-10-22 NOTE — [facility] Suicide Risk Assessment (Signed)
[facility] INPATIENT:  Family/Significant Other Suicide Prevention Education  Suicide Prevention Education:  Education Completed; No one has been identified by the patient as the family member/significant other with whom the patient will be residing, and identified as the person(s) who will aid the patient in the event of a mental health crisis (suicidal ideations/suicide attempt).  With written consent from the patient, the family member/significant other has been provided the following suicide prevention education, prior to the and/or following the discharge of the patient.  The suicide prevention education provided includes the following:  Suicide risk factors  Suicide prevention and interventions  National Suicide Hotline telephone number  [facility] assessment telephone number  [facility] Assistance 911  [facility] and/or Residential Mobile Crisis Unit telephone number  Request made of family/significant other to:  Remove weapons (e.g., guns, rifles, knives), all items previously/currently identified as safety concern.    Remove drugs/medications (over-the-counter, prescriptions, illicit drugs), all items previously/currently identified as a safety concern.  The family member/significant other verbalizes understanding of the suicide prevention education information provided.  The family member/significant other agrees to remove the items of safety concern listed above. The patient did not endorse SI at the time of admission, nor did the patient c/o SI during the stay here.  SPE not required.  However, I did reach out to [facility] Arroyo, godmother, [phone], with whom patient is residing.  We talked about the treatment plan, recommendations and I encouraged her to [facility].  Daryel Geraldorth, Jonathon B 10/22/2014, 10:27 AM

## 2014-10-22 NOTE — Discharge Summary (Signed)
Physician Discharge Summary Note  Patient:  Vincent [name] is an 19 y.o., male MRN:  [id number] DOB:  01/27/1996 Patient phone:  [phone] (home)  Patient address:   [address] [place] [address],  Total Time spent with patient: Greater than 30 minutes  Date of Admission:  10/17/2014 Date of Discharge: 10/22/2014   Reason for Admission:  Vincent [name] is 19 years old, an African-American male. [facility]. Recently discharged from this hospital after a mood stabilization treatments. He reports, "Vincent MylarCarmen keep saying I was aggressive, that I would not take my medicines. You know how women are, always try to instigate murder fucking stuff. You know what I mean. This kind of person should not be messing with my people. I still don't know what is wrong with me. Is it speech impediment? Am I retarded or what?  Vincent [name]: Vincent [name] is alert & oriented to person, disheveled, hair in dreads. He is currently presenting with disorganized/illogical, tangential speech & though blocking. He appears restless and fidgeting throughout this assessment. He seems to be aware that his thoughts are not right. He appears to be responding to some internal stimuli. At this point, he is a poor historian. His UDS test result is positive for Cannabis. He has a large healed scar to back of left hand/wrist. Vincent [name] says he punched his left hand through a glass.   Principal Problem: Cannabis-induced psychotic disorder with hallucinations Discharge Diagnoses: Patient Active Problem List   Diagnosis Date Noted  . 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 Cannabis use disorder, severe, dependence [F12.20] 10/20/2014  . Other psychoactive substance-induced mood disorder [F19.94] 10/20/2014  . Schizophrenia, unspecified type [F20.9] 10/17/2014  . 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 Substance or medication-induced  psychotic disorder with onset during intoxication [F19.959]   . 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 Episodic mood disorder [F39] 12/06/2013  . Psychosis [F29] 12/02/2013    Musculoskeletal: Strength & Muscle Tone: within normal limits Gait & Station: normal Patient leans: N/A  Psychiatric Specialty Exam:  See Suicide Risk Assessment Physical Exam  Review of Systems  Psychiatric/Behavioral: Negative for depression, suicidal ideas, hallucinations (Denies at this time) and memory loss. Substance abuse: THC. Nervous/anxious: Stable. Insomnia: Stable.     Blood pressure 128/64, pulse 80, temperature 98.4 F (36.9 C), temperature source Oral, resp. rate 18, height 6' (1.829 m), weight 76.204 kg (168 lb), SpO2 100 %.Body mass index is 22.78 kg/(m^2).   Past Medical History:  Past Medical History  Diagnosis Date  . Anxiety     Past Surgical History  Procedure Laterality Date  . Mandible surgery    . Wrist surgery      laceration by glass that pt sts was closed with surgery   Family History: History reviewed. No pertinent family history. Social History:  History  Alcohol Use  . 0.6 oz/week  . 1 Shots of liquor per week    Comment: occasionally/ maybe once a month     History  Drug Use  . Yes  . Special: Marijuana    Comment: once a month    History   Social History  . Marital Status: Single    Spouse Name: N/A    Number of Children: N/A  . Years of Education: N/A   Social History Main Topics  . Smoking status: Current Every Day Smoker -- 0.50 packs/day for 2 years  .  Smokeless tobacco: Never Used  . Alcohol Use: 0.6 oz/week    1 Shots of liquor per week     Comment: occasionally/ maybe once a month  . Drug Use: Yes    Special: Marijuana     Comment: once a month  . Sexual Activity: Yes    Birth Control/ Protection: None   Other Topics Concern  . None   Social History Narrative    Past  Psychiatric History: Diagnosis: Denies  Hospitalizations: yes  Outpatient Care: Yes  Substance Abuse Care: Denies  Self-Mutilation: Denies  Suicidal Attempts: Denies  Violent Behaviors: Potential      Risk to Self: Is patient at risk for suicide?: No Risk to Others:   Prior Inpatient Therapy:   Prior Outpatient Therapy:    Level of Care:  OP  Hospital Course:  Vincent [name] was admitted for Cannabis-induced psychotic disorder with hallucinations and crisis management.  He was treated with Neurontin for agitation and aggression; Trazodone for insomnia; Risperidone for schizophrenia.  Medical problems were identified and treated as needed.  Home medications were restarted as appropriate.  Improvement was monitored by observation and Vincent [name] daily report of symptom reduction.  Emotional and mental status was monitored by daily self inventory reports completed by Vincent [name] and clinical staff.         Vincent [name] was evaluated by the treatment team for stability and plans for continued recovery upon discharge.  He was offered further treatment options upon discharge including Residential, Intensive Outpatient and Outpatient treatment. He/She will follow up with [facility] for medication management and counseling.     Vincent [name] motivation was an integral factor for scheduling further treatment.  Employment, transportation, bed availability, health status, family support, and any pending legal issues were also considered during his hospital stay.  Upon completion of this admission the patient was both mentally and medically stable for discharge denying suicidal/homicidal ideation, auditory/visual/tactile hallucinations, delusional thoughts and paranoia.   Consults:  psychiatry  Significant Diagnostic Studies:  labs: UDS, ETOH, CBC/Diff, CMET, Urinalysis  Discharge Vitals:   Blood pressure 128/64, pulse 80, temperature 98.4 F  (36.9 C), temperature source Oral, resp. rate 18, height 6' (1.829 m), weight 76.204 kg (168 lb), SpO2 100 %. Body mass index is 22.78 kg/(m^2). Lab Results:   Results for orders placed or performed during the hospital encounter of 10/17/14 (from the past 72 hour(s))  TSH     Status: None   Collection Time: 10/21/14  6:15 AM  Result Value Ref Range   TSH 1.054 0.350 - 4.500 uIU/mL    Comment: Performed at [facility]  Lipid panel     Status: None   Collection Time: 10/21/14  6:15 AM  Result Value Ref Range   Cholesterol 138 0 - 200 mg/dL   Triglycerides 161 <096 mg/dL   HDL 47 >04 mg/dL   Total CHOL/HDL Ratio 2.9 RATIO   VLDL 28 0 - 40 mg/dL   LDL Cholesterol 63 0 - 99 mg/dL    Comment:        Total Cholesterol/HDL:CHD Risk Coronary Heart Disease Risk Table                     Men   Women  1/2 Average Risk   3.4   3.3  Average Risk       5.0   4.4  2 X Average Risk   9.6  7.1  3 X Average Risk  23.4   11.0        Use the calculated Patient Ratio above and the CHD Risk Table to determine the patient's CHD Risk.        ATP III CLASSIFICATION (LDL):  <100     mg/dL   Optimal  308-657  mg/dL   Near or Above                    Optimal  130-159  mg/dL   Borderline  846-962  mg/dL   High  >952     mg/dL   Very High Performed at [facility]   Hemoglobin A1c     Status: None   Collection Time: 10/21/14  6:15 AM  Result Value Ref Range   Hgb A1c MFr Bld 5.6 4.8 - 5.6 %    Comment: (NOTE)         Pre-diabetes: 5.7 - 6.4         Diabetes: >6.4         Glycemic control for adults with diabetes: <7.0    Mean Plasma Glucose 114 mg/dL    Comment: (NOTE) Performed At: [facility] [address] [place], [place] [id number] Mila Homer MD UU:[id number] Performed at [facility]     Physical Findings: AIMS: Facial and Oral Movements Muscles of Facial Expression: None, normal Lips and Perioral Area: None, normal Jaw: None,  normal Tongue: None, normal,Extremity Movements Upper (arms, wrists, hands, fingers): None, normal Lower (legs, knees, ankles, toes): None, normal, Trunk Movements Neck, shoulders, hips: None, normal, Overall Severity Severity of abnormal movements (highest score from questions above): None, normal Incapacitation due to abnormal movements: None, normal Patient's awareness of abnormal movements (rate only patient's report): No Awareness, Dental Status Current problems with teeth and/or dentures?: No Does patient usually wear dentures?: No  CIWA:    COWS:      See Psychiatric Specialty Exam and Suicide Risk Assessment completed by Attending Physician prior to discharge.  Discharge destination:  Home  Is patient on multiple antipsychotic therapies at discharge:  No   Has Patient had three or more failed trials of antipsychotic monotherapy by history:  No    Recommended Plan for Multiple Antipsychotic Therapies: NA      Discharge Instructions    Discharge instructions    Complete by:  As directed   Take all of you medications as prescribed by your mental healthcare provider.  Report any adverse effects and reactions from your medications to your outpatient provider promptly. Do not engage in alcohol and or illegal drug use while on prescription medicines. In the event of worsening symptoms call the crisis hotline, 911, and or go to the nearest emergency department for appropriate evaluation and treatment of symptoms. Follow-up with your primary care provider for your medical issues, concerns and or health care needs.   Keep all scheduled appointments.  If you are unable to keep an appointment call to reschedule.  Let the nurse know if you will need medications before next scheduled appointment.            Medication List    STOP taking these medications        benztropine 0.5 MG tablet  Commonly known as:  COGENTIN     divalproex 250 MG DR tablet  Commonly known as:   DEPAKOTE      TAKE these medications      Indication   gabapentin  100 MG capsule  Commonly known as:  NEURONTIN  Take 2 capsules (200 mg total) by mouth 3 (three) times daily at 8am, 3pm and bedtime. For agitation and aggression   Indication:  Aggressive Behavior, Agitation     risperiDONE 1 MG tablet  Commonly known as:  RISPERDAL  Take 1 tablet (1 mg total) by mouth 2 (two) times daily. For schizophrenia   Indication:  Schizophrenia     traZODone 50 MG tablet  Commonly known as:  DESYREL  Take 1 tablet (50 mg total) by mouth at bedtime as needed for sleep. For insomnia   Indication:  Trouble Sleeping         Follow-up recommendations:  Activity:  As tolerated Diet:  As tolerated Other:  Follow up with [facility]  Comments:   Patient has been instructed to take medications as prescribed; and report adverse effects to outpatient provider.  Follow up with primary doctor for any medical issues and If symptoms recur report to nearest emergency or crisis hot line.    Total Discharge Time: Greater than 30 minutes  Signed:  Rankin, Shuvon, FNP-BC 10/22/2014, 9:00 AM

## 2014-10-22 NOTE — Progress Notes (Signed)
D: Pt d/c home as per order.  A: D/C instructions and prescriptions reviewed with pt. All belongings in locker 52 was given to pt and belonging sheet signed in agreement with items received (cell phone, necklace, black pair of tennis shoes with lace, etc).  Safety maintained on Q 15 minutes checks for self injurious behavior without gestures or event to note at this time. Prescriptions and medication samples given to pt at time of departure from facility. R: Pt cooperative with d/c procedure. Understanding verbalized on d/c instructions and medication education review. Pt did signed his belonging sheet at time of d/c. No physical distress exhibited / reported by pt at this time. Pt denies concerns. Remains safe on Q 15 minutes checks as ordered till time of departure.

## 2014-10-22 NOTE — [facility] Suicide Risk Assessment (Signed)
[facility] Discharge Suicide Risk Assessment   Demographic Factors:  Male and Unemployed  Total Time spent with patient: 30 minutes  Musculoskeletal: Strength & Muscle Tone: within normal limits Gait & Station: normal Patient leans: N/A  Psychiatric Specialty Exam: Physical Exam  Review of Systems  Constitutional: Negative.   HENT: Negative.   Eyes: Negative.   Respiratory: Negative.   Cardiovascular: Negative.   Gastrointestinal: Negative.   Genitourinary: Negative.   Musculoskeletal: Negative.   Skin: Negative.   Neurological: Negative.   Psychiatric/Behavioral: Positive for substance abuse. Negative for depression, suicidal ideas and hallucinations.    Blood pressure 138/81, pulse 65, temperature 98.4 F (36.9 C), temperature source Oral, resp. rate 18, height 6' (1.829 m), weight 76.204 kg (168 lb), SpO2 100 %.Body mass index is 22.78 kg/(m^2).  General Appearance: Casual  Eye Contact::  Fair  Speech:  Clear and Coherent409  Volume:  Normal  Mood:  Euthymic  Affect:  inappropriate at times , possibly behavioral  Thought Process:  Goal Directed  Orientation:  Full (Time, Place, and Person)  Thought Content:  WDL  Suicidal Thoughts:  denies  Homicidal Thoughts:  denies  Memory:  Immediate;   Fair Recent;   Fair Remote;   Fair  Judgement:  Fair  Insight:  Shallow  Psychomotor Activity:  Normal  Concentration:  Fair  Recall:  [employer] of Knowledge:Fair  Language: Fair  Akathisia:  No  Handed:  Right  AIMS (if indicated):     Assets:  Communication Skills Desire for Improvement Physical Health Social Support  Sleep:  Number of Hours: 6.75  Cognition: WNL  ADL's:  Intact   Have you used any form of tobacco in the last 30 days? (Cigarettes, Smokeless Tobacco, Cigars, and/or Pipes): Yes  Has this patient used any form of tobacco in the last 30 days? (Cigarettes, Smokeless Tobacco, Cigars, and/or Pipes) Yes, Prescription not provided because: pt declines  Mental  Status Per Nursing Assessment::   On Admission:  NA  Current Mental Status by Physician: Patient denies SI/HIAH/VH  Loss Factors: NA  Historical Factors: Impulsivity  Risk Reduction Factors:   Positive social support  Continued Clinical Symptoms:  Alcohol/Substance Abuse/Dependencies  Cognitive Features That Contribute To Risk:  Polarized thinking    Suicide Risk:  Minimal: No identifiable suicidal ideation.  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 Cannabis use disorder, severe, dependence [F12.20] 10/20/2014  . Other psychoactive substance-induced mood disorder [F19.94] 10/20/2014  . 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

## 2014-10-22 NOTE — Progress Notes (Signed)
  [facility] :  Will you be returning to the same living situation after discharge:  Yes,  home with godmother At discharge, do you have transportation home?: Yes,  godmother Do you have the ability to pay for your medications: Yes,  mental helth  Release of information consent forms completed and in the chart;  Patient's signature needed at discharge.  Patient to Follow up at: Follow-up Information    Follow up with [facility] On 10/29/2014.   Why:  Wednesday at 9:00 with Kristin BruinsJackie Butler   Contact information:   [address]  [place]  [336] [phone]      Patient denies SI/HI: Yes,  yes    Safety Planning and Suicide Prevention discussed: Yes,  yes  Has patient been referred to the Quitline?: Patient refused referral  Vincent [name], Vincent [name] 10/22/2014, 12:42 PM

## 2014-10-27 NOTE — Progress Notes (Signed)
Patient Discharge Instructions:  After Visit Summary (AVS):   Faxed to:  10/27/14 Discharge Summary Note:   Faxed to:  10/27/14 Psychiatric Admission Assessment Note:   Faxed to:  10/27/14 Suicide Risk Assessment - Discharge Assessment:   Faxed to:  10/27/14 Faxed/Sent to the Next Level Care provider:  10/27/14 Faxed to [facility] @ [phone] Jerelene ReddenSheena E [place], 10/27/2014, 3:31 PM

## 2015-09-17 IMAGING — CR DG HIP COMPLETE 2+V*R*
3 series · 3 of 3 positions shown · non-contrast
Comparison: None

CLINICAL DATA: Fell yesterday landing on RIGHT hip, generalized
RIGHT hip pain, could range of motion, trauma, initial encounter

EXAM:
RIGHT HIP - COMPLETE 2+ VIEW

[t hip ap right (1 of 2)]
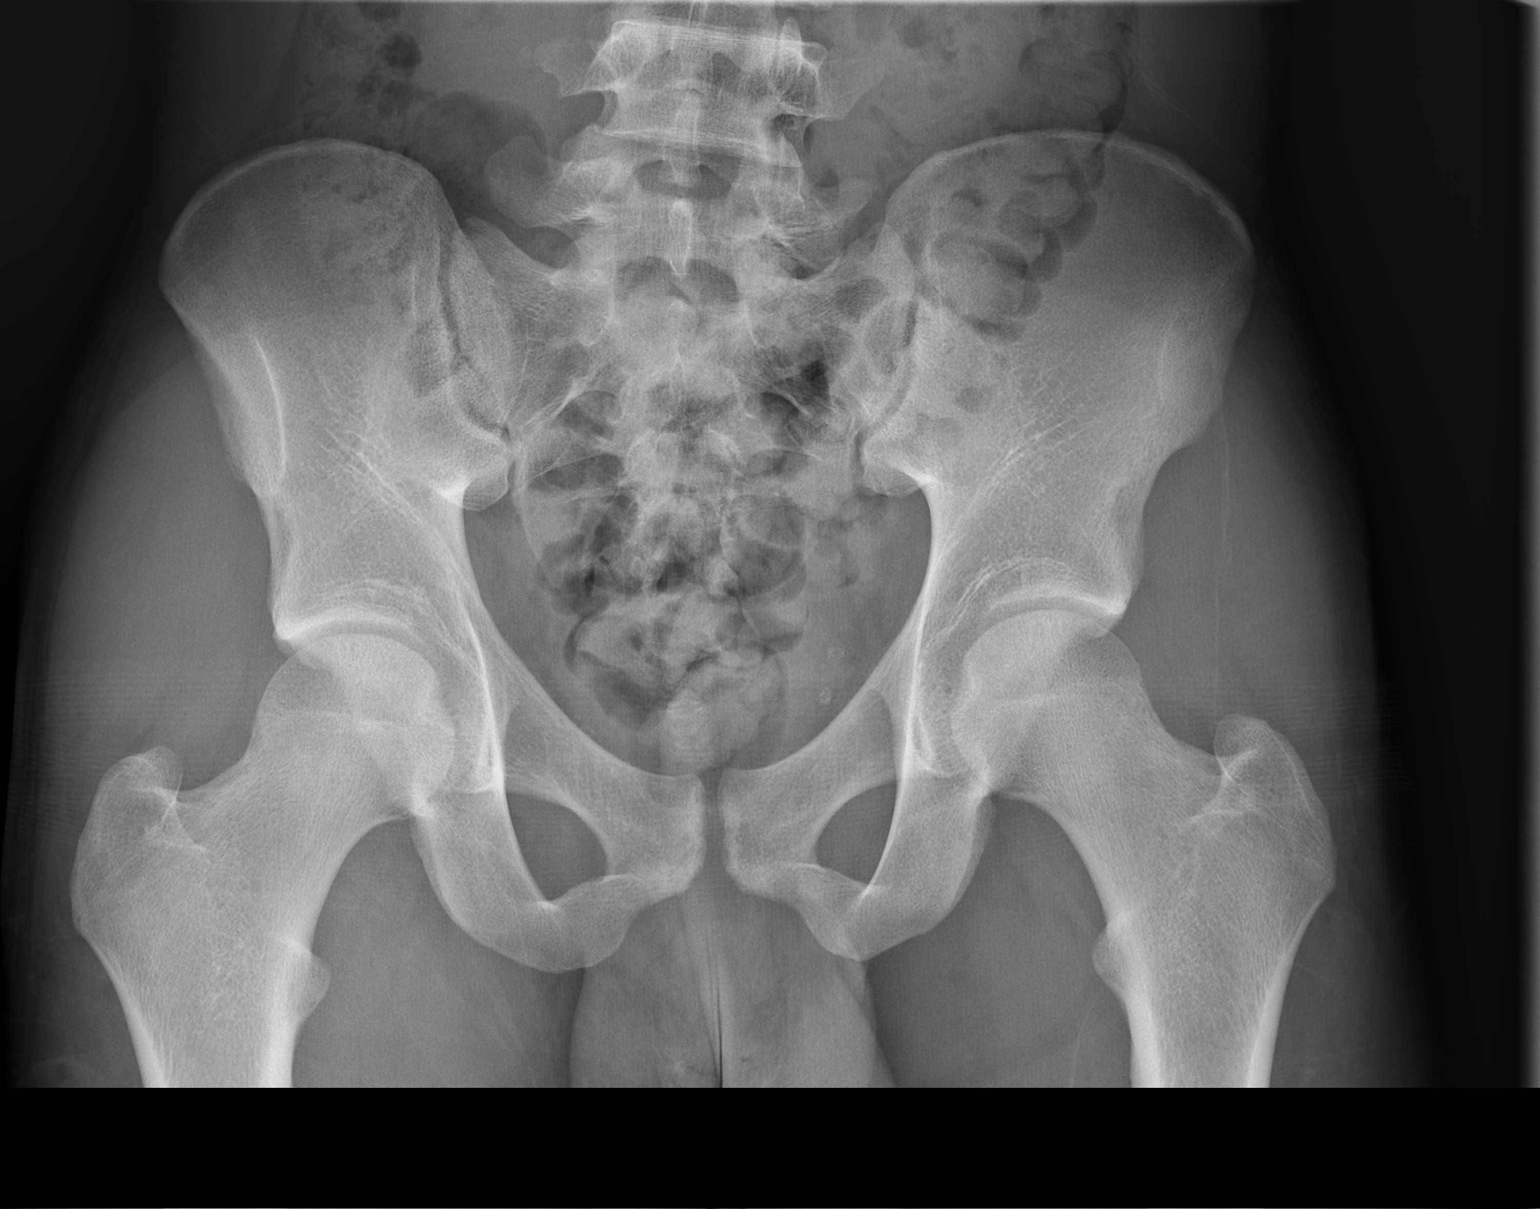

[t hip ap right (2 of 2)]
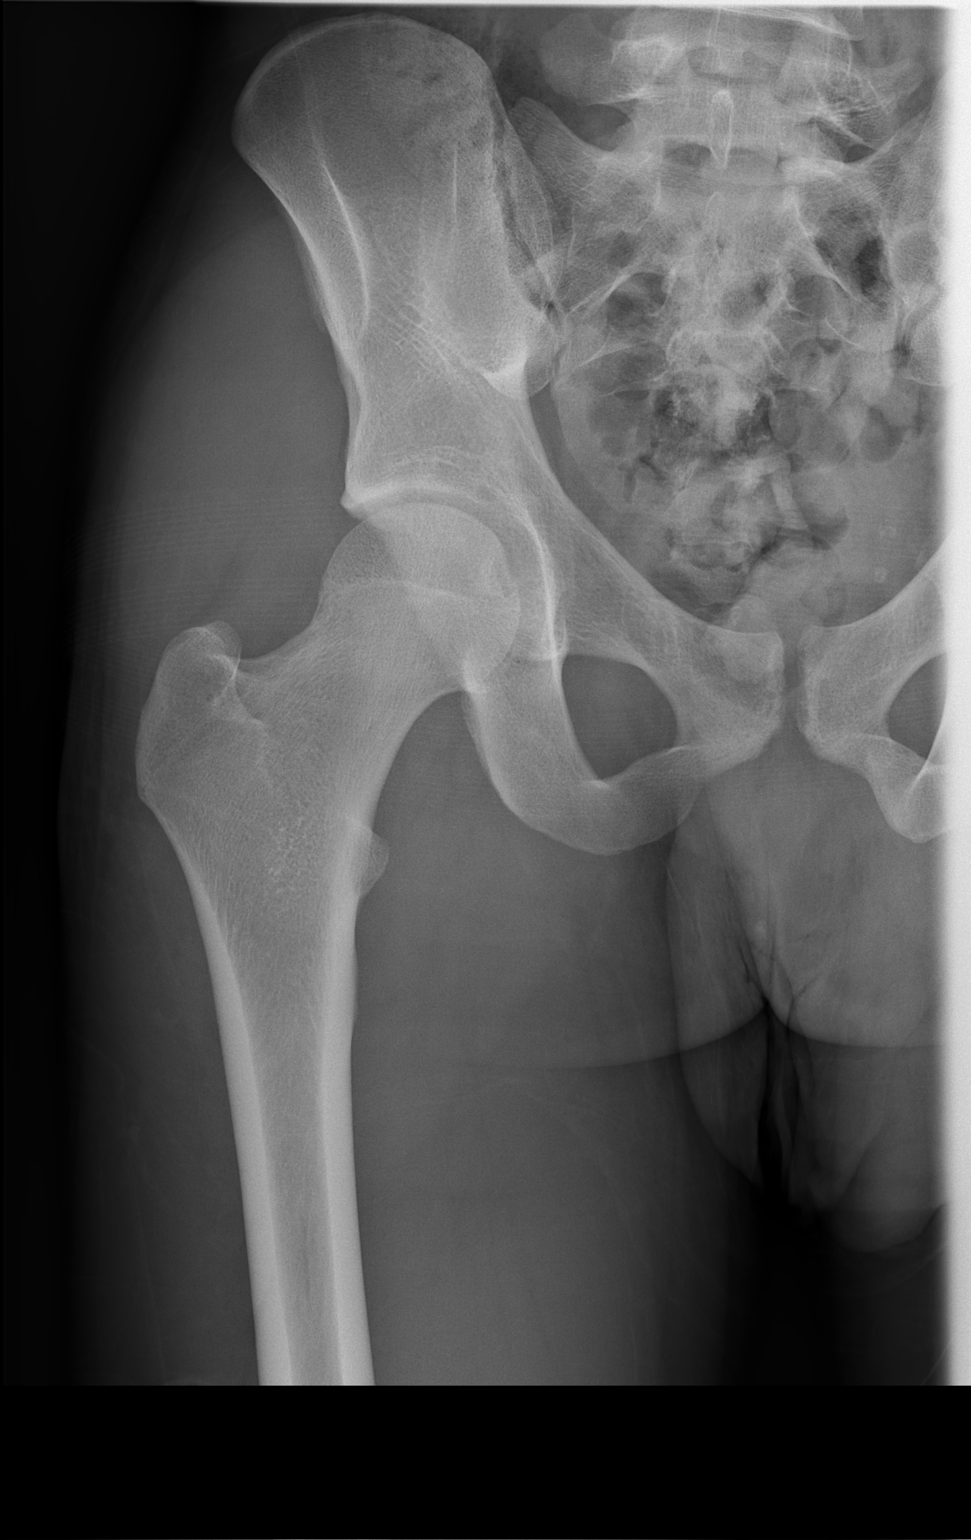

[t hip frog leg right]
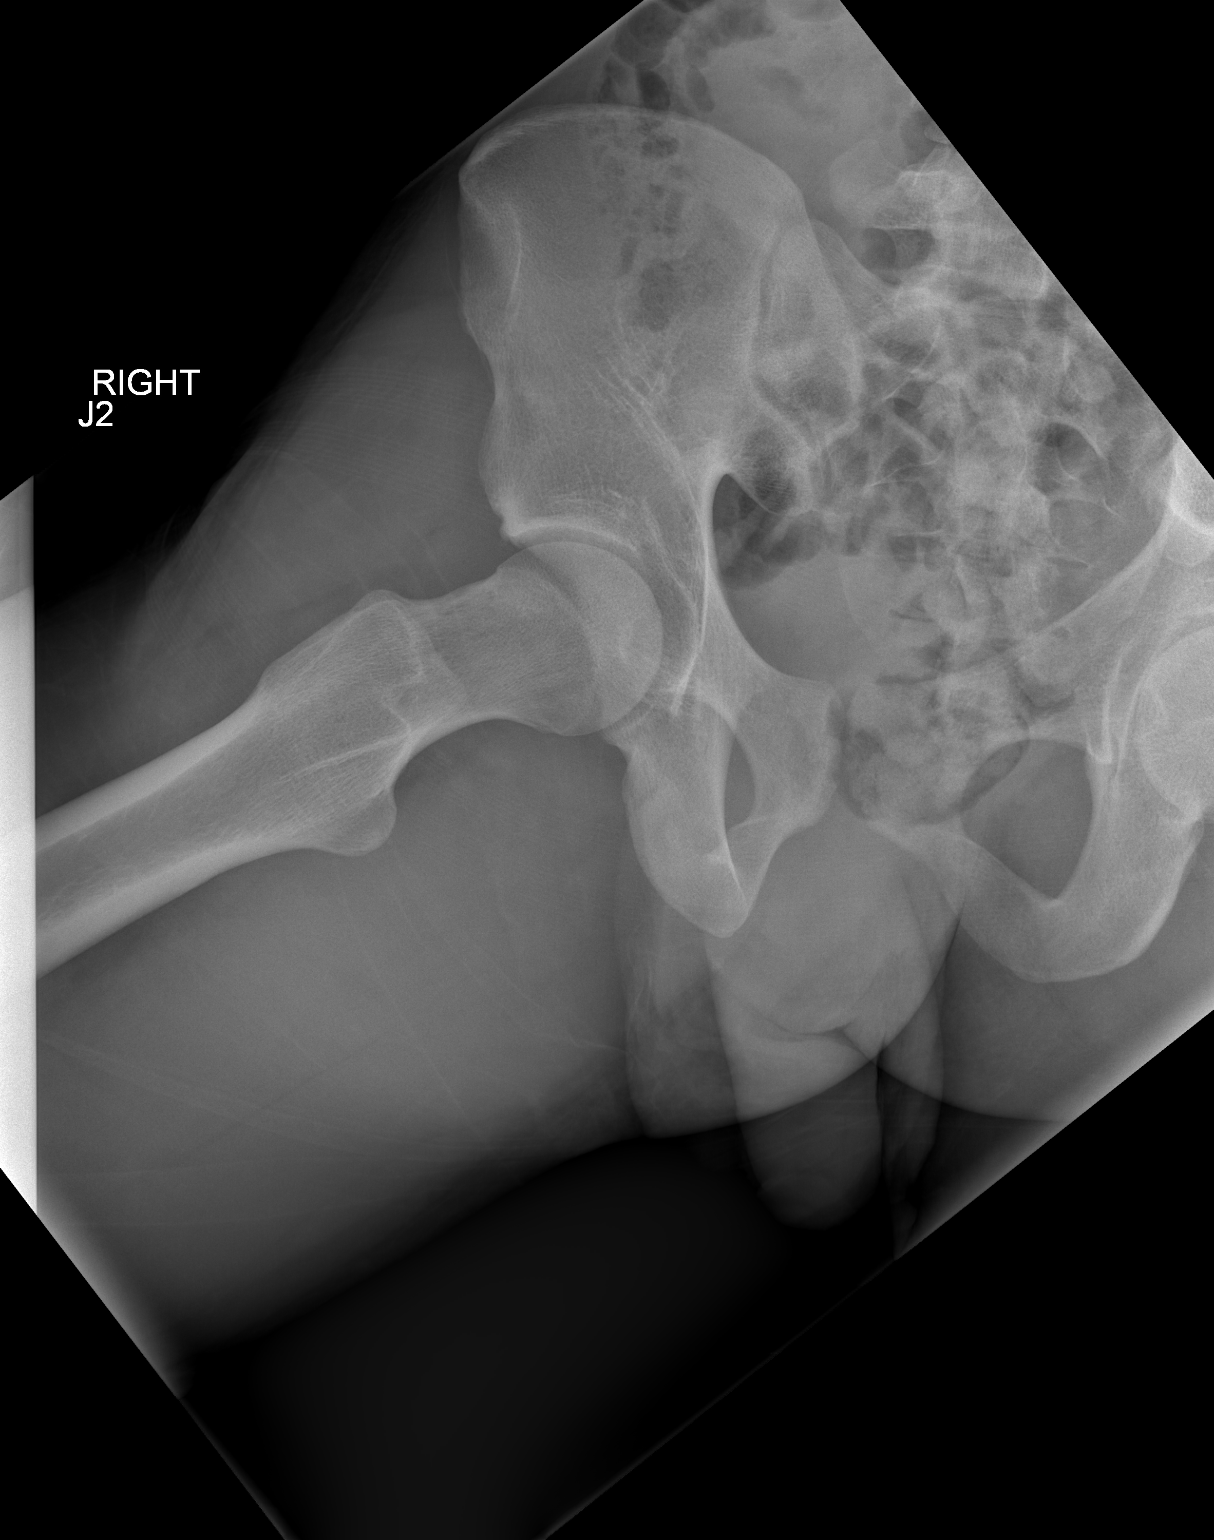

[3 of 3 positions shown; findings below may reference images not displayed]

FINDINGS: Symmetric hip and SI joints.

Osseous mineralization normal.

No acute fracture, dislocation or bone destruction.
IMPRESSION: No acute osseous abnormalities.

## 2020-02-18 ENCOUNTER — Ambulatory Visit (HOSPITAL_COMMUNITY): Payer: Federal, State, Local not specified - Other

## 2020-02-18 ENCOUNTER — Ambulatory Visit (HOSPITAL_COMMUNITY): Payer: Federal, State, Local not specified - Other | Admitting: Psychiatry

## 2020-02-20 ENCOUNTER — Ambulatory Visit (HOSPITAL_COMMUNITY): Payer: Federal, State, Local not specified - Other | Admitting: Psychiatry

## 2020-02-27 ENCOUNTER — Ambulatory Visit (HOSPITAL_COMMUNITY): Payer: Federal, State, Local not specified - Other | Admitting: Licensed Clinical Social Worker

## 2020-02-27 ENCOUNTER — Other Ambulatory Visit: Payer: Self-pay

## 2020-02-27 DIAGNOSIS — F203 Undifferentiated schizophrenia: Secondary | ICD-10-CM

## 2020-02-27 DIAGNOSIS — F250 Schizoaffective disorder, bipolar type: Secondary | ICD-10-CM

## 2020-02-27 NOTE — Progress Notes (Signed)
Comprehensive Clinical Assessment (CCA) Note  02/27/2020 Vincent [name] [id number]  Visit Diagnosis:      ICD-10-CM   1. 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 Schizoaffective disorder, bipolar type (HCC)  F25.0       CCA Screening, Triage and Referral (STR)  Patient Reported Information  Whom do you see for routine medical problems? I don't have a doctor   What Is the Reason for Your Visit/Call Today? No data recorded How Long Has This Been Causing You Problems? No data recorded What Do You Feel Would Help You the Most Today? Assessment Only;Medication   Have You Recently Been in Any Inpatient Treatment (Hospital/Detox/Crisis Center/28-Day Program)? Yes  Name/Location of Program/Hospital:[facility]  When Were You Discharged? 03/14/14   Have You Ever Received Services From [facility] Before? Yes   Have You Recently Had Any Thoughts About Hurting Yourself? No  Are You Planning to Commit Suicide/Harm Yourself At This time? No   Have you Recently Had Thoughts About Hurting Someone Karolee Ohs? No   Have You Used Any Alcohol or Drugs in the Past 24 Hours? No   Do You Currently Have a Therapist/Psychiatrist? Yes      CCA Screening Triage Referral Assessment Type of Contact: Face-to-Face  Is this Initial or Reassessment? Yes   Patient Reported Information Reviewed? Yes  Patient Left Without Being Seen?Reason for Not Completing Assessment: Pt does not want therapy only medication mgnt  Collateral Involvement: yes  Does Patient Have a Court Appointed Legal Guardian? No  Is CPS involved or ever been involved? Never  Is APS involved or ever been involved? Never   Patient Determined To Be At Risk for Harm To Self or Others Based on Review of Patient Reported Information or Presenting Complaint? No  Location of Assessment: [facility]   Does Patient Present under Involuntary Commitment? No   [place] of Residence:  [place]    Options For Referral: Medication Management     CCA Biopsychosocial  Intake/Chief Complaint:  CCA Intake With Chief Complaint Chief Complaint/Presenting Problem: schicoaffective disorder Patient's Currently Reported Symptoms/Problems: Pt states that symptoms are well managed when monthly shot provided. Individual's Strengths: Music/writing Individual's Abilities: pt is creative Type of Services Patient Feels Are Needed: medication mgnt  Mental Health Symptoms Depression:     Mania:  Mania: Racing thoughts, Change in energy/activity, Irritability  Anxiety:      Psychosis:  Psychosis: Grossly disorganized speech, Duration of symptoms less than six months, Affective flattening/alogia/avolition  Trauma:     Obsessions:     Compulsions:     Inattention:     Hyperactivity/Impulsivity:     Oppositional/Defiant Behaviors:     Emotional Irregularity:     Other Mood/Personality Symptoms:      Mental Status Exam Appearance and self-care  Stature:  Stature: Tall  Weight:  Weight: Average weight  Clothing:  Clothing: Casual  Grooming:  Grooming: Normal  Cosmetic use:     Posture/gait:  Posture/Gait: Slumped, Stooped  Motor activity:  Motor Activity: Restless  Sensorium  Attention:  Attention: Normal  Concentration:  Concentration: Scattered  Orientation:  Orientation: X5  Recall/memory:  Recall/Memory: Normal  Affect and Mood  Affect:  Affect: Tearful  Mood:     Relating  Eye contact:  Eye Contact: Fleeting  Facial expression:  Facial Expression: Sad  Attitude toward examiner:  Attitude Toward Examiner: Cooperative, Uninterested  Thought and Language  Speech flow: Speech Flow: Soft, Articulation error  Thought content:  Preoccupation:     Hallucinations:     Organization:     Transport planner of Knowledge:  Fund of Knowledge: Fair  Intelligence:  Intelligence: Average  Abstraction:  Abstraction: Normal  Judgement:  Judgement: Fair  Runner, broadcasting/film/video:     Insight:  Insight: Denial  Decision Making:     Social Functioning  Social Maturity:  Social Maturity: Impulsive  Social Judgement:     Stress  Stressors:  Stressors: Family conflict, Museum/gallery curator, Transitions  Coping Ability:  Coping Ability: Overwhelmed, Materials engineer Deficits:     Supports:  Supports: Family, Friends/Service system     Religion: Religion/Spirituality Are You A Religious Person?: No  Leisure/Recreation: Leisure / Recreation Do You Have Hobbies?: Yes Leisure and Hobbies: music  Exercise/Diet: Exercise/Diet Do You Exercise?: No Have You Gained or Lost A Significant Amount of Weight in the Past Six Months?: No Do You Follow a Special Diet?: No Do You Have Any Trouble Sleeping?: No   CCA Employment/Education  Employment/Work Situation: Employment / Work Situation Employment situation: Employed Where is patient currently employed?: [employer] How long has patient been employed?: 6 years Patient's job has been impacted by current illness: No What is the longest time patient has a held a job?: 6 years Has patient ever been in the [employer]?: No  Education: Education Is Patient Currently Attending School?: No Last Grade Completed: 12 Did Teacher, adult education From [employer]?: Yes Did Physicist, medical?: No Did Heritage manager?: No Did You Have An Individualized Education Program (IIEP): No Did You Have Any Difficulty At School?: No Patient's Education Has Been Impacted by Current Illness: No       DSM5 Diagnoses: Patient Active Problem List   Diagnosis Date Noted  . Schizoaffective disorder, bipolar type ([place]) 02/27/2020  . 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 Cannabis use disorder, severe, dependence ([place]) 10/20/2014  . Other psychoactive substance-induced mood disorder ([place]) 10/20/2014  . Adjustment disorder with disturbance of conduct 08/01/2014  . 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 Substance or medication-induced bipolar and related disorder with onset during intoxication [facility])      Client is a  24 year old Male. Client is referred by [facility]  for a schizoaffective disorder bipolar type .   Client states mental health symptoms as evidenced by   Abilify currently manage symptoms pt does states he get rapid thoughts, some mania,  Impaired communication, feeling empty, sad or worthless, auditory hallucination unable to make out what is being said   Client denies suicidal and homicidal ideations at this time  Client denies hallucinations and delusions at this time but has had them in the past      Assessment Information that integrates subjective and objective details with a therapist's professional interpretation: Pt was seen for 30 min assessment. Pt was uninterested in therapy and stated "I really only need my shot". SW stated that this was a voluntary and not needed in order to get monthly injection. Pt stated he would like to see therapist in 12 weeks. SW was agreeable to that. Pt was not interested in completing the rest of the assessment. Although it should be noted pt started to become very tearful when highschool was brought up in regards to old sporting events that he played. LCSW tried to interject, but pt did not want to process his emotion. SW did leave the option open if pt changed his mind.  Client meets criteria for schizoaffective bi polar type   Client states use of the following substances...  Pt denies drug use    Treatment recommendations are include plan Provide Psychiatric medication evaluation, monitoring, administration (e.g. injections) and management every 2-12 weeks, and/or as clinically indicated. Therapy is left open if pt decides to engage      Clinician assisted client with scheduling the following appointments: 12 weeks out. Clinician details of appointment.    Client agreed  with treatment recommendations. Patient Centered Plan: Patient is on the following Treatment Plan(s):  BH treatment plan   Weber Cooks

## 2020-03-04 ENCOUNTER — Ambulatory Visit (INDEPENDENT_AMBULATORY_CARE_PROVIDER_SITE_OTHER): Payer: No Payment, Other | Admitting: Psychiatry

## 2020-03-04 ENCOUNTER — Other Ambulatory Visit: Payer: Self-pay

## 2020-03-04 ENCOUNTER — Encounter (HOSPITAL_COMMUNITY): Payer: Self-pay | Admitting: Psychiatry

## 2020-03-04 ENCOUNTER — Encounter (HOSPITAL_COMMUNITY): Payer: Self-pay

## 2020-03-04 ENCOUNTER — Ambulatory Visit (INDEPENDENT_AMBULATORY_CARE_PROVIDER_SITE_OTHER): Payer: No Payment, Other | Admitting: *Deleted

## 2020-03-04 VITALS — BP 148/84 | HR 52 | Temp 97.9°F | Ht 72.0 in | Wt 192.0 lb

## 2020-03-04 DIAGNOSIS — F250 Schizoaffective disorder, bipolar type: Secondary | ICD-10-CM | POA: Diagnosis not present

## 2020-03-04 MED ORDER — ARIPIPRAZOLE ER 400 MG IM PRSY
400.0000 mg | PREFILLED_SYRINGE | Freq: Once | INTRAMUSCULAR | Status: AC
  Administered 2020-03-04: 400 mg via INTRAMUSCULAR

## 2020-03-04 MED ORDER — ARIPIPRAZOLE ER 400 MG IM PRSY: 400.0000 mg | PREFILLED_SYRINGE | INTRAMUSCULAR | 11 refills | Status: DC

## 2020-03-04 NOTE — Progress Notes (Addendum)
Psychiatric Initial Adult Assessment   Patient Identification: Vincent [name] MRN:  [id number] Date of Evaluation:  03/04/2020   Referral Source: Beverly Sessions  Chief Complaint:   " I just want my injection."  Visit Diagnosis:    ICD-10-CM   1. Schizoaffective disorder, bipolar type ([place])  F25.0     History of Present Illness: This is a 24 year old male with history of schizoaffective disorder now seen for establishing care after being referred by [facility].  Patient reported that he has been maintained on Abilify Maintena 400 mg every month for the past year or so.  He stated that ever since he got on this medication he has been doing well.  He stated that the voices do not bother him anymore.  He stated that prior to starting this injection he used to have auditory hallucinations and he would be "tripping". Patient denied having any paranoid delusions.  He denied any ideas of referencing. He denied thought broadcasting or thought withdrawal. He stated that he has no difficulties with falling or staying asleep. Regarding his mood, patient stated that he is doing well now.  He stated that about a week or so before his injection is due sometimes he may get a little agitated.  He denied getting into any trouble recently. He denied using any illicit drugs but then later admitted to smoking weed on a few days.  He reported that he has a 40-year-old young daughter from previous relationship and he is responsible for her.  He stated that he has been working at [employer] in [employer] so that he can have regular flow of income for himself to provide for his daughter. He informed that he currently lives with his grandparents. He stated that he is not in any relationship at present.  He stated that he just wants to get his injection and denied any other concerns.    Past Psychiatric History: Schizoaffective d/o, bipolar type  Previous Psychotropic Medications: Yes - Risperidone,  Abilify  Substance Abuse History in the last 12 months:  Yes.    Consequences of Substance Abuse: Negative  Past Medical History:  Past Medical History:  Diagnosis Date  . Anxiety     Past Surgical History:  Procedure Laterality Date  . MANDIBLE SURGERY    . WRIST SURGERY     laceration by glass that pt sts was closed with surgery    Family Psychiatric History: Denied  Family History: No family history on file.  Social History:   Social History   Socioeconomic History  . Marital status: Single    Spouse name: Not on file  . Number of children: Not on file  . Years of education: Not on file  . Highest education level: Not on file  Occupational History  . Not on file  Tobacco Use  . Smoking status: Current Every Day Smoker    Packs/day: 0.50    Years: 2.00    Pack years: 1.00  . Smokeless tobacco: Never Used  Substance and Sexual Activity  . Alcohol use: Yes    Alcohol/week: 1.0 standard drink    Types: 1 Shots of liquor per week    Comment: occasionally/ maybe once a month  . Drug use: Yes    Types: Marijuana    Comment: once a month  . Sexual activity: Yes    Birth control/protection: None  Other Topics Concern  . Not on file  Social History Narrative  . Not on file   Social Determinants  of Health   Financial Resource Strain:   . Difficulty of Paying Living Expenses:   Food Insecurity:   . Worried About Programme researcher, broadcasting/film/video in the Last Year:   . Barista in the Last Year:   Transportation Needs:   . Freight forwarder (Medical):   Marland Kitchen Lack of Transportation (Non-Medical):   Physical Activity:   . Days of Exercise per Week:   . Minutes of Exercise per Session:   Stress:   . Feeling of Stress :   Social Connections:   . Frequency of Communication with Friends and Family:   . Frequency of Social Gatherings with Friends and Family:   . Attends Religious Services:   . Active Member of Clubs or Organizations:   . Attends Tax inspector Meetings:   Marland Kitchen Marital Status:     Additional Social History: Lives with his grandparents  Allergies:   Allergies  Allergen Reactions  . Shrimp [Shellfish Allergy] Anaphylaxis    Metabolic Disorder Labs: Lab Results  Component Value Date   HGBA1C 5.6 10/21/2014   MPG 114 10/21/2014   No results found for: PROLACTIN Lab Results  Component Value Date   CHOL 138 10/21/2014   TRIG 139 10/21/2014   HDL 47 10/21/2014   CHOLHDL 2.9 10/21/2014   VLDL 28 10/21/2014   LDLCALC 63 10/21/2014   Lab Results  Component Value Date   TSH 1.054 10/21/2014    Therapeutic Level Labs: No results found for: LITHIUM No results found for: CBMZ No results found for: VALPROATE  Current Medications: Current Outpatient Medications  Medication Sig Dispense Refill  . gabapentin (NEURONTIN) 100 MG capsule Take 2 capsules (200 mg total) by mouth 3 (three) times daily at 8am, 3pm and bedtime. For agitation and aggression 180 capsule 0  . risperiDONE (RISPERDAL) 1 MG tablet Take 1 tablet (1 mg total) by mouth 2 (two) times daily. For schizophrenia 60 tablet 0  . traZODone (DESYREL) 50 MG tablet Take 1 tablet (50 mg total) by mouth at bedtime as needed for sleep. For insomnia 30 tablet 0   No current facility-administered medications for this visit.    Musculoskeletal: Strength & Muscle Tone: within normal limits Gait & Station: normal Patient leans: N/A  Psychiatric Specialty Exam: Review of Systems  There were no vitals taken for this visit.There is no height or weight on file to calculate BMI.  General Appearance: Disheveled  Eye Contact:  Good  Speech:  Clear and Coherent and Normal Rate  Volume:  Normal  Mood:  Euthymic  Affect:  Restricted  Thought Process:  Goal Directed and Descriptions of Associations: Intact  Orientation:  Full (Time, Place, and Person)  Thought Content:  Logical, denied hallucinations or paranoid delusions  Suicidal Thoughts:  No  Homicidal  Thoughts:  No  Memory:  Immediate;   Good Recent;   Good  Judgement:  Fair  Insight:  Fair  Psychomotor Activity:  Slightly fidegty  Concentration:  Concentration: Good and Attention Span: Good  Recall:  Good  Fund of Knowledge:Good  Language: Good  Akathisia:  Negative  Handed:  Right  AIMS (if indicated):  0  Assets:  Communication Skills Desire for Improvement Financial Resources/Insurance Housing Social Support  ADL's:  Intact  Cognition: WNL  Sleep:  Good   Screenings: AUDIT     [facility]) [facility]  Alcohol Use Disorder Identification Test Final Score (AUDIT) 1 0 0      Assessment and Plan: 24 year old male with history of schizoaffective disorder now maintained on Abilify Maintena injectable seen for establishing care.  Patient appears to be stable on his current injection and does not take any other oral medications.  He would like to continue the same regimen for now.  1. Schizoaffective disorder, bipolar type (HCC)  - ARIPiprazole ER (ABILIFY MAINTENA) 400 MG PRSY prefilled syringe; Inject 400 mg into the muscle every 30 (thirty) days.  Dispense: 1 each; Refill: 11  Follow-up in 1 month.  Zena Amos, MD 6/16/20211:15 PM

## 2020-03-04 NOTE — Progress Notes (Signed)
First injection appt with this Clinical research associate and this clinic. He is verbal and appropriate and appears and endorses being in a good mood. States he has been diagnosed with a mental health issue since 24 yo and does well if he stays on his medicines. He lives with his grandparents and is able to work and a [employer] and sometimes has two jobs but right now states he is taking a little break and just doing one. States he would like to get out on his own but not able to at this time, "I have a lot going on". He smells of marijuana but when asked he denies using very often, states he smokes cigarettes and its the filter he has in his pocket I am smelling. When left asked for deodorant or lotion but writer doesn't have any then says dont tell his Dad who brought him to his appt today. He saw Dr Evelene Croon prior to seeing writer for his injection. He denies any current psychotic sx. He tolerated the shot well. He is to return in one month to see both the Dr and get his next Abilify injection.

## 2020-04-03 ENCOUNTER — Ambulatory Visit (HOSPITAL_COMMUNITY): Payer: Self-pay

## 2020-04-03 ENCOUNTER — Ambulatory Visit (HOSPITAL_COMMUNITY): Payer: Federal, State, Local not specified - Other | Admitting: Psychiatry

## 2020-04-07 ENCOUNTER — Ambulatory Visit (HOSPITAL_COMMUNITY): Payer: Self-pay

## 2020-04-07 ENCOUNTER — Ambulatory Visit (HOSPITAL_COMMUNITY): Payer: Self-pay | Admitting: Psychiatry

## 2020-04-09 ENCOUNTER — Encounter (HOSPITAL_COMMUNITY): Payer: Self-pay

## 2020-04-09 ENCOUNTER — Ambulatory Visit (INDEPENDENT_AMBULATORY_CARE_PROVIDER_SITE_OTHER): Payer: No Payment, Other | Admitting: *Deleted

## 2020-04-09 ENCOUNTER — Telehealth (HOSPITAL_COMMUNITY): Payer: Self-pay | Admitting: *Deleted

## 2020-04-09 ENCOUNTER — Other Ambulatory Visit: Payer: Self-pay

## 2020-04-09 VITALS — BP 134/79 | HR 74 | Ht 72.0 in | Wt 186.0 lb

## 2020-04-09 DIAGNOSIS — F250 Schizoaffective disorder, bipolar type: Secondary | ICD-10-CM

## 2020-04-09 MED ORDER — ARIPIPRAZOLE ER 400 MG IM PRSY
400.0000 mg | PREFILLED_SYRINGE | INTRAMUSCULAR | Status: DC
  Administered 2020-04-09 – 2022-12-29 (×20): 400 mg via INTRAMUSCULAR

## 2020-04-09 NOTE — Telephone Encounter (Signed)
Patient arrived for injection  & MAR medication needed to be ordered. please order 10 refills &  the injection order needed. THANKS

## 2020-04-09 NOTE — Telephone Encounter (Addendum)
Pt no showed for his visit scheduled with writer yesterday. Order for injection to be given today placed.

## 2020-04-09 NOTE — Addendum Note (Signed)
Addended by: Zena Amos on: 04/09/2020 10:56 AM   Modules accepted: Orders

## 2020-04-09 NOTE — Progress Notes (Signed)
Patient Arrived for Injection & Tolerated Abilify Maintena 400 mg Injection well in L-ARM.  Patient arrived very talkative, very pleasant.

## 2020-05-11 ENCOUNTER — Ambulatory Visit (HOSPITAL_COMMUNITY): Payer: No Payment, Other | Admitting: *Deleted

## 2020-05-11 ENCOUNTER — Encounter (HOSPITAL_COMMUNITY): Payer: Self-pay

## 2020-05-11 VITALS — BP 147/91 | HR 60 | Ht 72.0 in | Wt 175.0 lb

## 2020-05-12 ENCOUNTER — Ambulatory Visit (HOSPITAL_COMMUNITY): Payer: No Payment, Other

## 2020-05-12 ENCOUNTER — Other Ambulatory Visit: Payer: Self-pay

## 2020-05-12 ENCOUNTER — Encounter (HOSPITAL_COMMUNITY): Payer: Self-pay

## 2020-05-12 NOTE — Progress Notes (Signed)
Walk in for injection one day late. He continues to work at [employer] and has for 6 years. His daughter started kindergarten this year. He is pleasant and appropriate. He is overdressed in Landscape architect and sweatpants for todays weather and when Clinical research associate commented on his dresss he said every time he comes here he is unprepared. Asked him what he meant by this and said his Dad woke him this am to come get his shot. States his dad is involved with him but he lives with his grandparents. Praised him for coming in today. He denies any psychotic sx and offers no complaints. Given as ordered Abilify 400 mg in R deltoid. To return for next injection in one month.

## 2020-05-21 ENCOUNTER — Ambulatory Visit (HOSPITAL_COMMUNITY): Payer: Federal, State, Local not specified - Other | Admitting: Licensed Clinical Social Worker

## 2020-06-12 ENCOUNTER — Encounter (HOSPITAL_COMMUNITY): Payer: Self-pay

## 2020-06-12 ENCOUNTER — Ambulatory Visit (INDEPENDENT_AMBULATORY_CARE_PROVIDER_SITE_OTHER): Payer: No Payment, Other | Admitting: *Deleted

## 2020-06-12 ENCOUNTER — Other Ambulatory Visit: Payer: Self-pay

## 2020-06-12 VITALS — BP 135/86 | HR 70 | Ht 72.0 in | Wt 184.0 lb

## 2020-06-12 DIAGNOSIS — F250 Schizoaffective disorder, bipolar type: Secondary | ICD-10-CM

## 2020-06-12 NOTE — Progress Notes (Signed)
In as scheduled for his monthly injection. Received this am Abilify Maintena 400 mg IM in R deltoid. He is disheveled this am, poor eye contact and less verbal than previous visit. Asked if he was feeling different today or angry he said. "your the only person that can come at me some sort of way and I am OK " Asked him to clarify but only said he was fine. He smells of marijuana which has been noted before on his previous appts. He states he works every day and thinks he can get his own place but other people dont want him too. Asked what people and did he think others thought he wasn't ready, responded "not that deep, just might not be a good place" He did comment on eczema in inner aspect of his L arm. To return in one month for next injection.

## 2020-06-18 ENCOUNTER — Encounter (HOSPITAL_COMMUNITY): Payer: No Payment, Other | Admitting: Psychiatry

## 2020-07-13 ENCOUNTER — Encounter (HOSPITAL_COMMUNITY): Payer: Self-pay

## 2020-07-13 ENCOUNTER — Ambulatory Visit (HOSPITAL_COMMUNITY): Payer: No Payment, Other | Admitting: *Deleted

## 2020-07-13 ENCOUNTER — Other Ambulatory Visit: Payer: Self-pay

## 2020-07-13 VITALS — BP 145/83 | HR 65 | Temp 97.7°F | Ht 72.0 in | Wt 182.0 lb

## 2020-07-13 DIAGNOSIS — F250 Schizoaffective disorder, bipolar type: Secondary | ICD-10-CM

## 2020-07-13 NOTE — Progress Notes (Signed)
In as scheduled for his monthly injection. He received 400 mg in his L deltoid without any difficulty. He states he is now working two jobs, one at [employer] in order to get his own place. He states he likes the [employer] and hopes he will move out of his grandparents in the next month or two. To follow up for his next injection on the 24th of 11.

## 2020-08-12 ENCOUNTER — Other Ambulatory Visit: Payer: Self-pay

## 2020-08-12 ENCOUNTER — Encounter (HOSPITAL_COMMUNITY): Payer: Self-pay

## 2020-08-12 ENCOUNTER — Ambulatory Visit (INDEPENDENT_AMBULATORY_CARE_PROVIDER_SITE_OTHER): Payer: No Payment, Other | Admitting: *Deleted

## 2020-08-12 VITALS — BP 135/79 | HR 61 | Ht 72.0 in | Wt 186.0 lb

## 2020-08-12 DIAGNOSIS — O039 Complete or unspecified spontaneous abortion without complication: Secondary | ICD-10-CM | POA: Insufficient documentation

## 2020-08-12 DIAGNOSIS — F250 Schizoaffective disorder, bipolar type: Secondary | ICD-10-CM

## 2020-08-12 NOTE — Progress Notes (Signed)
PATIENT ARRIVED TODAY FOR MONTHLY 400 MG ABILIFY MAINTENA INJECTION. PATIENT ARRIVED WITH MOM. PATIENT PLEASANT AS ALWAYS. TOLERATED INJECTION WELL IN Right-Arm.

## 2020-09-07 ENCOUNTER — Encounter (HOSPITAL_COMMUNITY): Payer: No Payment, Other | Admitting: Psychiatry

## 2020-09-07 ENCOUNTER — Ambulatory Visit (HOSPITAL_COMMUNITY): Payer: No Payment, Other

## 2020-09-23 ENCOUNTER — Encounter (HOSPITAL_COMMUNITY): Payer: Self-pay | Admitting: Psychiatry

## 2020-09-23 ENCOUNTER — Other Ambulatory Visit: Payer: Self-pay

## 2020-09-23 ENCOUNTER — Ambulatory Visit (INDEPENDENT_AMBULATORY_CARE_PROVIDER_SITE_OTHER): Payer: No Payment, Other | Admitting: Psychiatry

## 2020-09-23 ENCOUNTER — Ambulatory Visit (INDEPENDENT_AMBULATORY_CARE_PROVIDER_SITE_OTHER): Payer: No Payment, Other | Admitting: *Deleted

## 2020-09-23 VITALS — BP 116/66 | HR 80 | Ht 72.0 in | Wt 193.0 lb

## 2020-09-23 DIAGNOSIS — F250 Schizoaffective disorder, bipolar type: Secondary | ICD-10-CM | POA: Diagnosis not present

## 2020-09-23 MED ORDER — ARIPIPRAZOLE ER 400 MG IM PRSY: 400.0000 mg | PREFILLED_SYRINGE | INTRAMUSCULAR | 11 refills | Status: DC

## 2020-09-23 MED ORDER — ARIPIPRAZOLE ER 400 MG IM PRSY
400.0000 mg | PREFILLED_SYRINGE | INTRAMUSCULAR | Status: AC
Start: 2020-09-23 — End: 2021-07-28
  Administered 2020-09-23 – 2021-06-10 (×9): 400 mg via INTRAMUSCULAR

## 2020-09-23 MED ORDER — DOXEPIN HCL 25 MG PO CAPS: 25.0000 mg | ORAL_CAPSULE | Freq: Every evening | ORAL | 1 refills | Status: DC | PRN

## 2020-09-23 MED ORDER — GABAPENTIN 300 MG PO CAPS: 300.0000 mg | ORAL_CAPSULE | Freq: Two times a day (BID) | ORAL | 1 refills | Status: DC

## 2020-09-23 NOTE — Progress Notes (Signed)
[facility] Progress Note   Patient Identification: Vincent [name] MRN:  [id number] Date of Evaluation:  09/23/2020    Chief Complaint:   "I feel like the injection is not strong enough by end of the month."  Visit Diagnosis:    ICD-10-CM   1. Schizoaffective disorder, bipolar type ([place])  F25.0 ARIPiprazole ER (ABILIFY MAINTENA) 400 MG PRSY prefilled syringe    gabapentin (NEURONTIN) 300 MG capsule    doxepin (SINEQUAN) 25 MG capsule    History of Present Illness: Patient presented as walk-in today.  Patient was last seen by the writer back in June.  He did come fairly regularly for his monthly injections however the last injection dose was back in November.  He missed his injection dose in December. Patient reported that he feels that Abilify Maintena injection does not last for an entire month for him now.  He stated that towards the last week or so he starts feeling kind of uneasy and starts having racing thoughts.  He stated that his mind starts racing and he gets irritable.  He denied getting into any kind of trouble at work or at home. He still working at [employer].  He informed that he doesn't get off until 1 or 2 AM at night.  He stated that he has taken Depakote in the past for mood swings however that caused him to have sexual side effects and he does not want to take it again.  He also reported poor sleep and stated that he is taking trazodone for poor sleep but that caused him to have vivid dreams.  Patient was recommended to come to get his monthly injection dose every 4 weeks instead of 30 days now. He was recommended addition of gabapentin for mood stabilization to his current regimen.  He was also offered doxepin to help him with sleep.Potential side effects of medication and risks vs benefits of treatment vs non-treatment were explained and discussed. All questions were answered.  Patient stated he was willing to try these medications and make sure that he does not miss his  monthly injection dose of Abilify Maintena from now onwards.   Past Psychiatric History: Schizoaffective d/o, bipolar type  Previous Psychotropic Medications: Yes - Risperidone, Abilify  Substance Abuse History in the last 12 months:  Yes.    Consequences of Substance Abuse: Negative  Past Medical History:  Past Medical History:  Diagnosis Date  . Anxiety     Past Surgical History:  Procedure Laterality Date  . MANDIBLE SURGERY    . WRIST SURGERY     laceration by glass that pt sts was closed with surgery    Family Psychiatric History: Denied  Family History: No family history on file.  Social History:   Social History   Socioeconomic History  . Marital status: Single    Spouse name: Not on file  . Number of children: Not on file  . Years of education: Not on file  . Highest education level: Not on file  Occupational History  . Not on file  Tobacco Use  . Smoking status: Current Every Day Smoker    Packs/day: 0.50    Years: 2.00    Pack years: 1.00  . Smokeless tobacco: Never Used  Substance and Sexual Activity  . Alcohol use: Yes    Alcohol/week: 1.0 standard drink    Types: 1 Shots of liquor per week    Comment: occasionally/ maybe once a month  . Drug use: Yes  Types: Marijuana    Comment: once a month  . Sexual activity: Yes    Birth control/protection: None  Other Topics Concern  . Not on file  Social History Narrative  . Not on file   Social Determinants of Health   Financial Resource Strain: Not on file  Food Insecurity: Not on file  Transportation Needs: Not on file  Physical Activity: Not on file  Stress: Not on file  Social Connections: Not on file    Additional Social History: Lives with his grandparents  Allergies:   Allergies  Allergen Reactions  . 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 doxepin (SINEQUAN) 25 MG capsule Take 1 capsule (25 mg total) by mouth at bedtime as needed (sleep). 30 capsule 1  . gabapentin (NEURONTIN) 300 MG capsule Take 1 capsule (300 mg total) by mouth 2 (two) times daily. 60 capsule 1  . ARIPiprazole ER (ABILIFY MAINTENA) 400 MG PRSY prefilled syringe Inject 400 mg into the muscle every 28 (twenty-eight) days. 1 each 11   Current Facility-Administered Medications  Medication Dose Route Frequency Provider Last Rate Last Admin  . ARIPiprazole ER (ABILIFY MAINTENA) 400 MG prefilled syringe 400 mg  400 mg Intramuscular Q30 days Zena Amos, MD   400 mg at 08/12/20 1015  . ARIPiprazole ER (ABILIFY MAINTENA) 400 MG prefilled syringe 400 mg  400 mg Intramuscular Q28 days Zena Amos, MD        Musculoskeletal: Strength & Muscle Tone: within normal limits Gait & Station: normal Patient leans: N/A  Psychiatric Specialty Exam: Review of Systems  Blood pressure 116/66, pulse 80, height 6' (1.829 m), weight 193 lb (87.5 kg), SpO2 97 %.Body mass index is 26.18 kg/m.  General Appearance: Fairly Groomed  Eye Contact:  Good  Speech:  Clear and Coherent and Normal Rate  Volume:  Normal  Mood:  Euthymic  Affect:  Restricted  Thought Process:  Goal Directed and Descriptions of Associations: Intact  Orientation:  Full (Time, Place, and Person)  Thought Content:  Logical, denied hallucinations or paranoid delusions  Suicidal Thoughts:  No  Homicidal Thoughts:  No  Memory:  Immediate;   Good Recent;    Good  Judgement:  Fair  Insight:  Fair  Psychomotor Activity:  Slightly fidegty  Concentration:  Concentration: Good and Attention Span: Good  Recall:  Good  Fund of Knowledge:Good  Language: Good  Akathisia:  Negative  Handed:  Right  AIMS (if indicated):  0  Assets:  Communication Skills Desire for Improvement Financial Resources/Insurance Housing Social Support  ADL's:  Intact  Cognition: WNL  Sleep:  Poor   Screenings: AUDIT   Flowsheet Row [facility]) [facility] Ad[facility] ([facility]  Alcohol Use Disorder  Identification Test Final Score (AUDIT) 1 0 0      Assessment and Plan: Patient seen in 6 months after his initial intake appointment last year.  Patient reported noticing that Abilify Maintena injection does not last him for entire month, requested a mood stabilizer.  Also reported poor sleep and was agreeable to try doxepin for the same.  1. Schizoaffective disorder, bipolar type ([facility])  - ARIPiprazole ER (ABILIFY MAINTENA) 400 MG PRSY prefilled syringe; Inject 400 mg into the muscle every 28 (twenty-eight) days.  Dispense: 1 each; Refill: 11 -Start gabapentin (NEURONTIN) 300 MG capsule; Take 1 capsule (300 mg total) by mouth 2 (two) times daily.  Dispense: 60 capsule; Refill: 1 -Start doxepin (SINEQUAN) 25 MG capsule; Take 1 capsule (25 mg total) by mouth at bedtime as needed (sleep).  Dispense: 30 capsule; Refill: 1  Follow-up in 4 weeks.  Zena Amos, MD 1/5/20229:15 AM

## 2020-09-23 NOTE — Progress Notes (Signed)
PATIENT ARRIVED TODAY FOR  400 MG ABILIFY MAINTENA  INJECTION. PATIENT TOLERATED WELL IN  LEFT ARM. PATIENT TALKATIVE & PLEASANT AS ALWAYS. NO SI/HI NOR VH/AH.

## 2020-10-21 ENCOUNTER — Ambulatory Visit (HOSPITAL_COMMUNITY): Payer: No Payment, Other

## 2020-10-21 ENCOUNTER — Ambulatory Visit (HOSPITAL_COMMUNITY): Payer: No Payment, Other | Admitting: Psychiatry

## 2020-10-23 ENCOUNTER — Encounter (HOSPITAL_COMMUNITY): Payer: Self-pay

## 2020-10-23 ENCOUNTER — Ambulatory Visit (INDEPENDENT_AMBULATORY_CARE_PROVIDER_SITE_OTHER): Payer: No Payment, Other | Admitting: *Deleted

## 2020-10-23 ENCOUNTER — Other Ambulatory Visit: Payer: Self-pay

## 2020-10-23 VITALS — BP 142/78 | HR 68 | Ht 72.0 in | Wt 190.0 lb

## 2020-10-23 DIAGNOSIS — F250 Schizoaffective disorder, bipolar type: Secondary | ICD-10-CM

## 2020-10-23 NOTE — Progress Notes (Signed)
PATIENT ARRIVED FOR INJECTION 400 MG ABILIFY MAINTENA. PLEASANT AS ALWAYS. SPOKE OF RECENT PASSING OF HIS GRANDMOTHER & THE FUNERAL. PATIENT STATED CLOSE TO TIME FOR HIS NEXT INJECTION HE ALWAYS FEEL LIKE IT'S WEARING OFF. HE FEELS RESTLESS & ANXIOUS & IRRITATED. TOLERATED INJECTION WELL IN RIGHT-ARM. NO SI/HI NOR VH/AH.

## 2020-11-10 ENCOUNTER — Ambulatory Visit (HOSPITAL_COMMUNITY): Payer: No Payment, Other | Admitting: Psychiatry

## 2020-11-11 ENCOUNTER — Ambulatory Visit (HOSPITAL_COMMUNITY): Payer: No Payment, Other | Admitting: Psychiatry

## 2020-11-20 ENCOUNTER — Ambulatory Visit (HOSPITAL_COMMUNITY): Payer: No Payment, Other | Admitting: *Deleted

## 2020-11-20 ENCOUNTER — Encounter (HOSPITAL_COMMUNITY): Payer: Self-pay

## 2020-11-20 ENCOUNTER — Other Ambulatory Visit: Payer: Self-pay

## 2020-11-20 VITALS — BP 165/84 | HR 56 | Ht 72.0 in | Wt 194.0 lb

## 2020-11-20 DIAGNOSIS — F250 Schizoaffective disorder, bipolar type: Secondary | ICD-10-CM

## 2020-11-20 NOTE — Progress Notes (Signed)
In as scheduled for his monthly injection of Abilify 400 mg IM in her L deltoid without difficulty. He is nicely dressed, mom brought him but didn't come in. He asked if we help with housing, he is currently living in a room, he says its a nice room but he would like something more. We do not help with housing but provided him the address and number to [facility] to see if they could help him. He is pleasant, verbal and appropriate today. He offered no complaints. Will see him back in one month for his next injection.

## 2020-12-18 ENCOUNTER — Encounter (HOSPITAL_COMMUNITY): Payer: Self-pay | Admitting: Psychiatry

## 2020-12-18 ENCOUNTER — Encounter (HOSPITAL_COMMUNITY): Payer: Self-pay

## 2020-12-18 ENCOUNTER — Ambulatory Visit (HOSPITAL_COMMUNITY): Payer: No Payment, Other | Admitting: *Deleted

## 2020-12-18 ENCOUNTER — Ambulatory Visit (INDEPENDENT_AMBULATORY_CARE_PROVIDER_SITE_OTHER): Payer: No Payment, Other | Admitting: Psychiatry

## 2020-12-18 ENCOUNTER — Other Ambulatory Visit: Payer: Self-pay

## 2020-12-18 VITALS — BP 154/75 | HR 71 | Ht 72.0 in | Wt 186.0 lb

## 2020-12-18 DIAGNOSIS — F250 Schizoaffective disorder, bipolar type: Secondary | ICD-10-CM

## 2020-12-18 NOTE — Progress Notes (Signed)
[facility] Progress Note   Patient Identification: Vincent [name] MRN:  [id number] Date of Evaluation:  12/18/2020    Chief Complaint:   "I am doing good."  Visit Diagnosis:    ICD-10-CM   1. Schizoaffective disorder, bipolar type ([facility])  F25.0     History of Present Illness: Patient seen for follow-up today.  Patient has been receiving his monthly IM Abilify Maintena 400 mg injections regularly.  He stated that he is doing well overall. Writer asked him if he was sleeping any better with the help of the medicines to which the patient responded, " Which medication?". Upon clarification patient informed that he never picked up any prescriptions because he was not sure which pharmacy was closed to go to.  He also then stated that he forgot completely. Writer informed him that his prescriptions were sent to [facility] and the address was provided to him by the nursing staff.  Patient stated that he will surely try them and then let the writer know next time. He said he sleeps fairly okay now but sometimes he does need some help.  His mood is okay for the most part but sometimes feels a little bit irritable. He is agreeable to try gabapentin for mood and doxepin to help with sleep. He stated that he is currently working 2 jobs and things get busy sometimes but he is managing well.   Past Psychiatric History: Schizoaffective d/o, bipolar type  Previous Psychotropic Medications: Yes - Risperidone, Abilify  Substance Abuse History in the last 12 months:  Yes.    Consequences of Substance Abuse: Negative  Past Medical History:  Past Medical History:  Diagnosis Date  . Anxiety     Past Surgical History:  Procedure Laterality Date  . MANDIBLE SURGERY    . WRIST SURGERY     laceration by glass that pt sts was closed with surgery    Family Psychiatric History: Denied  Family History: No family history on file.  Social History:   Social History   Socioeconomic History  .  Marital status: Single    Spouse name: Not on file  . Number of children: Not on file  . Years of education: Not on file  . Highest education level: Not on file  Occupational History  . Not on file  Tobacco Use  . Smoking status: Current Every Day Smoker    Packs/day: 0.50    Years: 2.00    Pack years: 1.00  . Smokeless tobacco: Never Used  Substance and Sexual Activity  . Alcohol use: Yes    Alcohol/week: 1.0 standard drink    Types: 1 Shots of liquor per week    Comment: occasionally/ maybe once a month  . Drug use: Yes    Types: Marijuana    Comment: once a month  . Sexual activity: Yes    Birth control/protection: None  Other Topics Concern  . Not on file  Social History Narrative  . Not on file   Social Determinants of Health   Financial Resource Strain: Not on file  Food Insecurity: Not on file  Transportation Needs: Not on file  Physical Activity: Not on file  Stress: Not on file  Social Connections: Not on file    Additional Social History: Lives with his grandparents  Allergies:   Allergies  Allergen Reactions  . Shrimp [Shellfish Allergy] Anaphylaxis    Metabolic Disorder Labs: Lab Results  Component Value Date   HGBA1C 5.6 10/21/2014   MPG  114 10/21/2014   No results found for: PROLACTIN Lab Results  Component Value Date   CHOL 138 10/21/2014   TRIG 139 10/21/2014   HDL 47 10/21/2014   CHOLHDL 2.9 10/21/2014   VLDL 28 10/21/2014   LDLCALC 63 10/21/2014   Lab Results  Component Value Date   TSH 1.054 10/21/2014    Therapeutic Level Labs: No results found for: LITHIUM No results found for: CBMZ No results found for: VALPROATE  Current Medications: Current Outpatient Medications  Medication Sig Dispense Refill  . ARIPiprazole ER (ABILIFY MAINTENA) 400 MG PRSY prefilled syringe Inject 400 mg into the muscle every 28 (twenty-eight) days. 1 each 11  . doxepin (SINEQUAN) 25 MG capsule Take 1 capsule (25 mg total) by mouth at bedtime as  needed (sleep). 30 capsule 1  . gabapentin (NEURONTIN) 300 MG capsule Take 1 capsule (300 mg total) by mouth 2 (two) times daily. 60 capsule 1   Current Facility-Administered Medications  Medication Dose Route Frequency Provider Last Rate Last Admin  . ARIPiprazole ER (ABILIFY MAINTENA) 400 MG prefilled syringe 400 mg  400 mg Intramuscular Q30 days Zena Amos, MD   400 mg at 08/12/20 1015  . ARIPiprazole ER (ABILIFY MAINTENA) 400 MG prefilled syringe 400 mg  400 mg Intramuscular Q28 days Zena Amos, MD   400 mg at 11/20/20 0935    Musculoskeletal: Strength & Muscle Tone: within normal limits Gait & Station: normal Patient leans: N/A  Psychiatric Specialty Exam: Review of Systems  There were no vitals taken for this visit.There is no height or weight on file to calculate BMI.  General Appearance: Casual and Disheveled  Eye Contact:  Good  Speech:  Clear and Coherent and Normal Rate  Volume:  Normal  Mood:  Euthymic  Affect:  Restricted  Thought Process:  Goal Directed and Descriptions of Associations: Intact  Orientation:  Full (Time, Place, and Person)  Thought Content:  Logical, denied hallucinations or paranoid delusions  Suicidal Thoughts:  No  Homicidal Thoughts:  No  Memory:  Immediate;   Good Recent;   Good  Judgement:  Fair  Insight:  Fair  Psychomotor Activity:  Slightly fidegty  Concentration:  Concentration: Good and Attention Span: Good  Recall:  Good  Fund of Knowledge:Good  Language: Good  Akathisia:  Negative  Handed:  Right  AIMS (if indicated):  3  Assets:  Communication Skills Desire for Improvement Financial Resources/Insurance Housing Social Support  ADL's:  Intact  Cognition: WNL  Sleep:  Fair   Screenings: AIMS   Flowsheet Row Office Visit from 12/18/2020 in [facility]  AIMS Total Score 3    AUDIT   Flowsheet Row [facility]) [facility] Ad[facility]  [facility]) [facility]  Alcohol Use Disorder Identification Test Final Score (AUDIT) 1 0 0    PHQ2-9   Flowsheet Row Office Visit from 12/18/2020 in [facility]  PHQ-2 Total Score 0    Flowsheet Row [facility] Visit from 12/18/2020 in [facility]  C-SSRS RISK CATEGORY No Risk      Assessment and Plan: Patient reported that overall he is doing well.  He never picked up the prescriptions for gabapentin and doxepin because he forgot about them.  He was informed that the prescriptions were sent to [facility] and he was encouraged to pick  them up and try them.  He verbalizes understanding. Patient received his IM Abilify Maintena 400 mg dose today.  He will return back in a month for the next dose.  1. Schizoaffective disorder, bipolar type ([facility])  - ARIPiprazole ER (ABILIFY MAINTENA) 400 MG PRSY prefilled syringe; Inject 400 mg into the muscle every 28 (twenty-eight) days.  Dispense: 1 each; Refill: 11.  Dose received today. -Start gabapentin (NEURONTIN) 300 MG capsule; Take 1 capsule (300 mg total) by mouth 2 (two) times daily.  Dispense: 60 capsule; Refill: 1 -Start doxepin (SINEQUAN) 25 MG capsule; Take 1 capsule (25 mg total) by mouth at bedtime as needed (sleep).  Dispense: 30 capsule; Refill: 1  Follow-up in 3 months with Clinical research associate. Return back next month for IM injection dose.   Zena Amos, MD 4/1/20229:22 AM

## 2020-12-18 NOTE — Progress Notes (Signed)
In as planned for his monthly Abilify M 400 mg injection. Today he got his shot in his R deltoid without difficulty. He states he has a new job at [employer] as well as [employer]. He is staying at a boarding house, and states he has one girlfriend. Shared a picture with staff of his daughter. To return in one month for his next injection.

## 2021-01-18 ENCOUNTER — Other Ambulatory Visit: Payer: Self-pay

## 2021-01-18 ENCOUNTER — Encounter (HOSPITAL_COMMUNITY): Payer: Self-pay

## 2021-01-18 ENCOUNTER — Ambulatory Visit (INDEPENDENT_AMBULATORY_CARE_PROVIDER_SITE_OTHER): Payer: No Payment, Other | Admitting: *Deleted

## 2021-01-18 VITALS — BP 133/86 | HR 84 | Ht 72.0 in | Wt 188.0 lb

## 2021-01-18 DIAGNOSIS — F250 Schizoaffective disorder, bipolar type: Secondary | ICD-10-CM

## 2021-01-18 NOTE — Progress Notes (Signed)
Patient arrived for injection 400 mg Abilify Maintena. Patient informed still working both jobs. Stated that he's happy the medication is keeping him focused & is working  & that he's no longer running from it. Patient is pleasant as always.  NO HI/SI NOR AH/VH. Tolerated  Injection well in Left Arm.

## 2021-02-16 ENCOUNTER — Encounter (HOSPITAL_COMMUNITY): Payer: Self-pay

## 2021-02-16 ENCOUNTER — Ambulatory Visit (HOSPITAL_COMMUNITY): Payer: No Payment, Other | Admitting: *Deleted

## 2021-02-16 ENCOUNTER — Other Ambulatory Visit: Payer: Self-pay

## 2021-02-16 VITALS — BP 141/75 | HR 80 | Ht 72.0 in | Wt 180.0 lb

## 2021-02-16 DIAGNOSIS — F250 Schizoaffective disorder, bipolar type: Secondary | ICD-10-CM

## 2021-02-16 NOTE — Progress Notes (Signed)
In as scheduled for his monthly injection of Abilify M 400mg , Today he got his injection in his R deltoid without any issues. He is quiet today, even when spoken to and questions asked very minimal with responses. He denies any concerns and offers no complaints. He is disheveled today. He states he is still working two jobs. When asked him who brought him to his appt today he said he caught a ride with someone. Provided several opportunities for him to share any concerns or changes in his status but each time he said he was fine. To return in one month.

## 2021-03-10 ENCOUNTER — Other Ambulatory Visit (HOSPITAL_COMMUNITY): Payer: Self-pay | Admitting: Psychiatry

## 2021-03-10 DIAGNOSIS — F250 Schizoaffective disorder, bipolar type: Secondary | ICD-10-CM

## 2021-03-16 ENCOUNTER — Ambulatory Visit (HOSPITAL_COMMUNITY): Payer: No Payment, Other | Admitting: *Deleted

## 2021-03-16 ENCOUNTER — Other Ambulatory Visit: Payer: Self-pay

## 2021-03-16 ENCOUNTER — Encounter (HOSPITAL_COMMUNITY): Payer: Self-pay

## 2021-03-16 VITALS — BP 135/76 | HR 74 | Ht 72.0 in | Wt 184.0 lb

## 2021-03-16 DIAGNOSIS — F250 Schizoaffective disorder, bipolar type: Secondary | ICD-10-CM

## 2021-03-16 NOTE — Progress Notes (Signed)
In as scheduled for monthly injection of Abilify M 400 mg in his L deltoid without difficulty. He is pleasant and appropriate and denies any issues when asked. He continues to work and is working today. He states his daughter is doing well and he is not having anything to do with his daughters mom. He denies seeing any other women at this time. TO return in one month.

## 2021-04-13 ENCOUNTER — Other Ambulatory Visit (HOSPITAL_COMMUNITY): Payer: Self-pay | Admitting: Psychiatry

## 2021-04-13 ENCOUNTER — Ambulatory Visit (HOSPITAL_COMMUNITY): Payer: No Payment, Other

## 2021-04-13 DIAGNOSIS — F250 Schizoaffective disorder, bipolar type: Secondary | ICD-10-CM

## 2021-04-15 ENCOUNTER — Encounter (HOSPITAL_COMMUNITY): Payer: Self-pay

## 2021-04-15 ENCOUNTER — Ambulatory Visit (HOSPITAL_COMMUNITY): Payer: No Payment, Other | Admitting: *Deleted

## 2021-04-15 ENCOUNTER — Other Ambulatory Visit: Payer: Self-pay

## 2021-04-15 VITALS — BP 132/72 | HR 64 | Ht 72.0 in | Wt 183.0 lb

## 2021-04-15 DIAGNOSIS — F250 Schizoaffective disorder, bipolar type: Secondary | ICD-10-CM

## 2021-04-15 NOTE — Progress Notes (Signed)
In late for her shot. He came in on his own to get his Abilify M 400 mg injection, given today in his R deltoid without difficulty. He states he can feel he needs his medicine because he is more irritable and his thinking is not clear, states its "cluttered". Have tried to have him get his injection every 28 days but he is often late and we havent been able to be consistent with that schedule. He looks good but he does smell of marijuana as is his baseline. Speaks lovingly of his 13 year old daughter and wanting to surprise her for her birthday with something special. He is to return in 28 days for his next injection. He continues to work two jobs and has a lot to keep up with.

## 2021-05-11 ENCOUNTER — Encounter (HOSPITAL_COMMUNITY): Payer: Self-pay

## 2021-05-11 ENCOUNTER — Ambulatory Visit (INDEPENDENT_AMBULATORY_CARE_PROVIDER_SITE_OTHER): Payer: No Payment, Other | Admitting: Registered Nurse

## 2021-05-11 ENCOUNTER — Other Ambulatory Visit: Payer: Self-pay

## 2021-05-11 ENCOUNTER — Ambulatory Visit (HOSPITAL_COMMUNITY): Payer: No Payment, Other

## 2021-05-11 ENCOUNTER — Ambulatory Visit (HOSPITAL_COMMUNITY): Payer: No Payment, Other | Admitting: *Deleted

## 2021-05-11 ENCOUNTER — Encounter (HOSPITAL_COMMUNITY): Payer: Self-pay | Admitting: Registered Nurse

## 2021-05-11 VITALS — BP 149/88 | HR 73 | Ht 72.0 in | Wt 181.0 lb

## 2021-05-11 DIAGNOSIS — F250 Schizoaffective disorder, bipolar type: Secondary | ICD-10-CM

## 2021-05-11 NOTE — Progress Notes (Signed)
BH MD/PA/NP OP Progress Note  05/11/2021 10:45 AM Vincent [name]  MRN:  [id number]  Chief Complaint: Visit for Abilify injection Chief Complaint   Other    HPI: Vincent [name], 25 y.o., male seen today for follow up and administration of long acting injectable (Abilify Maintena).  Today patient reports he has been doing well.  He is excited about his daughters birthday and the surprise he is going to give her.  Patient states he continues to work 2 jobs but is currently living in a motel and would like to find a house but is having difficulty.  Discussed resources available that may be able to assist him.  Patient states since starting his medication "getting the shot" everything has been good"  He is dressed appropriated for weather.  He reports that he has not depression or anxiety.  GAD-7, PHQ-9, and CSRRS conducted by provider see scores below.  He reports Long acting injectable (LAI) continues to help with mood, hallucinations, and paranoia "I don't have none since getting the shot."  He denies suicidal/self-harm/homicidal ideation, psychosis, and paranoia.  He also reports he has been eating/sleeping without difficulty and tolerating medications without any adverse reaction.    Visit Diagnosis:    ICD-10-CM   1. Schizoaffective disorder, bipolar type (HCC)  F25.0       Past Psychiatric History: Schizoaffective d/o, bipolar type  Past Medical History:  Past Medical History:  Diagnosis Date   Anxiety     Past Surgical History:  Procedure Laterality Date   MANDIBLE SURGERY     WRIST SURGERY     laceration by glass that pt sts was closed with surgery    Family Psychiatric History: Denies  Family History: History reviewed. No pertinent family history.  Social History:  Social History   Socioeconomic History   Marital status: Single    Spouse name: Not on file   Number of children: Not on file   Years of education: Not on file   Highest education level:  Not on file  Occupational History   Not on file  Tobacco Use   Smoking status: Every Day    Packs/day: 0.50    Years: 2.00    Pack years: 1.00    Types: Cigarettes   Smokeless tobacco: Never  Substance and Sexual Activity   Alcohol use: Yes    Alcohol/week: 1.0 standard drink    Types: 1 Shots of liquor per week    Comment: occasionally/ maybe once a month   Drug use: Yes    Types: Marijuana    Comment: once a month   Sexual activity: Yes    Birth control/protection: None  Other Topics Concern   Not on file  Social History Narrative   Not on file   Social Determinants of Health   Financial Resource Strain: Not on file  Food Insecurity: Not on file  Transportation Needs: Not on file  Physical Activity: Not on file  Stress: Not on file  Social Connections: Not on file    Allergies:  Allergies  Allergen Reactions   Shrimp [Shellfish Allergy] Anaphylaxis    Metabolic Disorder Labs: Lab Results  Component Value Date   HGBA1C 5.6 10/21/2014   MPG 114 10/21/2014   No results found for: PROLACTIN Lab Results  Component Value Date   CHOL 138 10/21/2014   TRIG 139 10/21/2014   HDL 47 10/21/2014   CHOLHDL 2.9 10/21/2014   VLDL 28 10/21/2014   LDLCALC 63 10/21/2014  Lab Results  Component Value Date   TSH 1.054 10/21/2014    Therapeutic Level Labs: No results found for: LITHIUM No results found for: VALPROATE No components found for:  CBMZ  Current Medications: Current Outpatient Medications  Medication Sig Dispense Refill   ABILIFY MAINTENA 400 MG PRSY prefilled syringe INJECT 400MG  INTRAMUSCULARLY EVERY 30 DAYS AS DIRECTED (ADMINISTER WITHIN 30 MINUTES OF RECONSTITUTION) 1 each 0   doxepin (SINEQUAN) 25 MG capsule Take 1 capsule (25 mg total) by mouth at bedtime as needed (sleep). 30 capsule 1   gabapentin (NEURONTIN) 300 MG capsule Take 1 capsule (300 mg total) by mouth 2 (two) times daily. 60 capsule 1   Current Facility-Administered Medications   Medication Dose Route Frequency Provider Last Rate Last Admin   ARIPiprazole ER (ABILIFY MAINTENA) 400 MG prefilled syringe 400 mg  400 mg Intramuscular Q30 days , MD   400 mg at 08/12/20 1015   ARIPiprazole ER (ABILIFY MAINTENA) 400 MG prefilled syringe 400 mg  400 mg Intramuscular Q28 days 08/14/20, MD   400 mg at 05/11/21 05/13/21     Musculoskeletal: Strength & Muscle Tone: within normal limits Gait & Station: normal Patient leans: N/A  Psychiatric Specialty Exam: Review of Systems  Constitutional: Negative.   HENT: Negative.    Eyes: Negative.   Respiratory: Negative.    Cardiovascular: Negative.   Gastrointestinal: Negative.   Genitourinary: Negative.   Musculoskeletal: Negative.   Skin: Negative.   Neurological: Negative.   Hematological: Negative.   Psychiatric/Behavioral:  Negative for agitation (Denies), behavioral problems, confusion, decreased concentration, dysphoric mood, hallucinations (Denies), self-injury, sleep disturbance and suicidal ideas (Denies). Nervous/anxious: stable.        Reports he is doing well and medication really helps   There were no vitals taken for this visit.There is no height or weight on file to calculate BMI.  General Appearance: Casual and Appropriate  Eye Contact:  Good  Speech:  Clear and Coherent and Normal Rate  Volume:  Normal  Mood:  Euthymic and Pleasant  Affect:  Appropriate and Congruent  Thought Process:  Coherent, Goal Directed, and Descriptions of Associations: Intact  Orientation:  Full (Time, Place, and Person)  Thought Content: WDL and Logical   Suicidal Thoughts:  No  Homicidal Thoughts:  No  Memory:  Immediate;   Good Recent;   Good Remote;   Good  Judgement:  Intact  Insight:  Present  Psychomotor Activity:  Normal  Concentration:  Concentration: Good and Attention Span: Good  Recall:  Good  Fund of Knowledge: Good  Language: Good  Akathisia:  Negative  Handed:  Right  AIMS (if indicated):  done  Assets:  Communication Skills Desire for Improvement Resilience Social Support  ADL's:  Intact  Cognition: WNL  Sleep:  Good   Screenings: AIMS    Flowsheet Row Office Visit from 05/11/2021 in [facility] Office Visit from 12/18/2020 in [facility]  AIMS Total Score 0 3      AUDIT    Flowsheet Row [facility]) [facility]  Alcohol Use Disorder Identification Test Final Score (AUDIT) 1 0 0      PHQ2-9    Flowsheet Row Office Visit from 05/11/2021 in [facility] Office Visit from 12/18/2020 in [facility]  PHQ-2 Total Score 0 0      Flowsheet Row Office Visit from 05/11/2021 in [facility] Office Visit from 12/18/2020 in [facility]  C-SSRS RISK CATEGORY No Risk No Risk      Assessment and Plan: Patient appears to be doing well.  He reports that medications are effective and managing his psychiatric condition.  During visit patient is sitting up right in chair in no acute distress.  He is alert/oriented x 4, calm/cooperative and mood is congruent with affect.  He spoke in a clear tone at moderate volume, and normal pace, and good eye contact.  His thought process is coherent and relevant; and there is no indication that he is currently responding to internal/external stimuli or experiencing delusional thought content.  Patient denies Patient denies depression and anxiety.  He also denies suicidal/self-harm/homicidal ideation, psychosis, and paranoia.  Patient answered questions appropriately.  Patient received Abilify Maintena 400 mg IM with no difficulty and will return in one month  for the next injection.    1. Schizoaffective disorder, bipolar type (HCC)   Aripiprazole ER (Abilify Maintena) 400 mg IM prefilled syringe; Inject 400 mg into muscle every 28 (twenty-eight)days.  Last injection given 05/11/2021  Continue Gabapentin 300 mg Tid for mood and Sinequan 25 mg for sleep   Follow up in one month for Abilify Maintena injection   Katera Rybka, NP 05/11/2021, 10:45 AM

## 2021-05-11 NOTE — Progress Notes (Signed)
In as expected for his monthly injection of Abilify M 400 mg. Given today in his R DELTOID without issue. He is pleasant, denies any sx of offer any complaints. He continues to work two jobs at [employer]. He was seen today by the NP for a 3 month assessment. He is to return in one month for his next shot.

## 2021-05-18 ENCOUNTER — Other Ambulatory Visit (HOSPITAL_COMMUNITY): Payer: Self-pay | Admitting: Psychiatry

## 2021-05-18 DIAGNOSIS — F250 Schizoaffective disorder, bipolar type: Secondary | ICD-10-CM

## 2021-05-18 MED ORDER — ABILIFY MAINTENA 400 MG IM PRSY: PREFILLED_SYRINGE | INTRAMUSCULAR | 11 refills | Status: DC

## 2021-06-08 ENCOUNTER — Ambulatory Visit (HOSPITAL_COMMUNITY): Payer: No Payment, Other

## 2021-06-10 ENCOUNTER — Encounter (HOSPITAL_COMMUNITY): Payer: Self-pay

## 2021-06-10 ENCOUNTER — Ambulatory Visit (HOSPITAL_COMMUNITY): Payer: No Payment, Other

## 2021-06-10 ENCOUNTER — Other Ambulatory Visit: Payer: Self-pay

## 2021-06-10 ENCOUNTER — Ambulatory Visit (HOSPITAL_COMMUNITY): Payer: No Payment, Other | Admitting: *Deleted

## 2021-06-10 VITALS — BP 127/81 | HR 56 | Ht 72.0 in | Wt 178.0 lb

## 2021-06-10 DIAGNOSIS — F250 Schizoaffective disorder, bipolar type: Secondary | ICD-10-CM

## 2021-06-10 NOTE — Progress Notes (Unsigned)
In today, late for his shot this month but he does call and make his own arrangements to get his injection. He states today he is doing well and he feels great coming here. He continues to work, lives alone but has the support of his family and his dad brings him to the appt. When asked he says his daughter is doing great. He is very proud of his daughter and speaks of her often. Injection of Abilify M 400 mg given today in his R DELTOID without difficulty. He is to return in one month for his next injection.

## 2021-07-08 ENCOUNTER — Ambulatory Visit (HOSPITAL_COMMUNITY): Payer: No Payment, Other

## 2021-07-15 ENCOUNTER — Ambulatory Visit (HOSPITAL_COMMUNITY): Payer: No Payment, Other | Admitting: *Deleted

## 2021-07-15 ENCOUNTER — Other Ambulatory Visit: Payer: Self-pay

## 2021-07-15 ENCOUNTER — Encounter (HOSPITAL_COMMUNITY): Payer: Self-pay

## 2021-07-15 VITALS — BP 130/74 | HR 67 | Ht 72.0 in | Wt 177.0 lb

## 2021-07-15 DIAGNOSIS — F250 Schizoaffective disorder, bipolar type: Secondary | ICD-10-CM

## 2021-07-15 NOTE — Progress Notes (Signed)
In as scheduled for his monthly injection of Abilify M 400 mg. Today he got it in his L DELTOID without difficulty. He let one of his jobs go and continues to work at [employer]. He has an appt re his application for disability and is not getting food stamps. He is also searching for housing. He is to return in one month for his next injection.

## 2021-08-11 ENCOUNTER — Other Ambulatory Visit: Payer: Self-pay

## 2021-08-11 ENCOUNTER — Encounter (HOSPITAL_COMMUNITY): Payer: Self-pay | Admitting: *Deleted

## 2021-08-11 ENCOUNTER — Ambulatory Visit (INDEPENDENT_AMBULATORY_CARE_PROVIDER_SITE_OTHER): Payer: No Payment, Other | Admitting: *Deleted

## 2021-08-11 VITALS — BP 131/80 | HR 75 | Ht 72.0 in | Wt 186.0 lb

## 2021-08-11 DIAGNOSIS — F250 Schizoaffective disorder, bipolar type: Secondary | ICD-10-CM

## 2021-08-11 NOTE — Progress Notes (Signed)
Patient arrived for his injection  ARIPiprazole ER (ABILIFY MAINTENA) 400 MG  Injection Tolerated well in Right-Arm. Pleasant as Always & Looking Forward to Family Time over the ThanksGivingHoliday

## 2021-09-09 ENCOUNTER — Ambulatory Visit (INDEPENDENT_AMBULATORY_CARE_PROVIDER_SITE_OTHER): Payer: No Payment, Other | Admitting: Registered Nurse

## 2021-09-09 ENCOUNTER — Encounter (HOSPITAL_COMMUNITY): Payer: Self-pay

## 2021-09-09 ENCOUNTER — Encounter (HOSPITAL_COMMUNITY): Payer: Self-pay | Admitting: Registered Nurse

## 2021-09-09 ENCOUNTER — Other Ambulatory Visit: Payer: Self-pay

## 2021-09-09 ENCOUNTER — Ambulatory Visit (INDEPENDENT_AMBULATORY_CARE_PROVIDER_SITE_OTHER): Payer: No Payment, Other | Admitting: *Deleted

## 2021-09-09 DIAGNOSIS — F250 Schizoaffective disorder, bipolar type: Secondary | ICD-10-CM

## 2021-09-09 MED ORDER — ARIPIPRAZOLE ER 400 MG IM PRSY
400.0000 mg | PREFILLED_SYRINGE | Freq: Once | INTRAMUSCULAR | Status: AC
  Administered 2021-09-09: 10:00:00 400 mg via INTRAMUSCULAR

## 2021-09-09 MED ORDER — DOXEPIN HCL 25 MG PO CAPS: 25.0000 mg | ORAL_CAPSULE | Freq: Every evening | ORAL | 1 refills | Status: DC | PRN

## 2021-09-09 NOTE — Progress Notes (Signed)
Patient arrived for injection ARIPiprazole ER (ABILIFY MAINTENA) 400 MG  Tolerated Well in LEFT-ARM  Looking like a total new person.Patient now has a friend "Mel Almond" whose helping with his case mgmt. And Patient has CUT HIS DRED LOCKS, Clean Shaven & Looking HANDSOME. Patient is on 2nd interviews for new employment & very optimistic about his future.

## 2021-09-09 NOTE — Progress Notes (Signed)
BH MD/PA/NP OP Progress Note  09/09/2021 10:17 AM Vincent [name]  MRN:  [id number]  Chief Complaint: Visit for Abilify injection   HPI: Vincent [name], 25 y.o., male with psychiatric diagnosis of schizoaffective disorder bipolar type.  He is managed with Abilify Maintena 400 mg every 28 days.  He asked that the Doxepin 25 mg at bedtime as needed for sleep, to be restarted again today as needed for sleep even though he has been sleeping well.  Wanted it just incase when close to time for next injection if needed he would have it.  He is seen today for follow up and administration of long acting injectable (Abilify Maintena).  Today patient states that he continues to do well.  States he is no longer working 2 jobs but has a job interview today at 11:00 am.  Reports he is homeless and want to find more permanent housing.  Resources given for shelters and men housing in [place].  Vincent [name] reports that he has no depression or anxiety.  He denies suicidal/self-harm/homicidal ideation, psychosis, and paranoia.  He also denies any fluctuations in mood or manic behavior.   He reports Long acting injectable (LAI) continues to help with mental health stability and denies any adverse reactions.  He denies any abnormal movements.  "The shot it keeps me balanced."  AIMS, GAD-7, PHQ-9, and CSRRS conducted by provider see scores below.  He also reports he has been eating/sleeping without difficulty.   Visit Diagnosis:    ICD-10-CM   1. Schizoaffective disorder, bipolar type ([place])  F25.0 Lipid Profile    TSH    CBC with Differential    COMPLETE METABOLIC PANEL WITH GFR    Drug Screen, Urine    HgB A1c    ARIPiprazole ER (ABILIFY MAINTENA) 400 MG prefilled syringe 400 mg      Past Psychiatric History: Schizoaffective d/o, bipolar type  Past Medical History:  Past Medical History:  Diagnosis Date   Anxiety     Past Surgical History:  Procedure Laterality Date   MANDIBLE SURGERY      WRIST SURGERY     laceration by glass that pt sts was closed with surgery    Family Psychiatric History: Denies  Family History: History reviewed. No pertinent family history.  Social History:  Social History   Socioeconomic History   Marital status: Single    Spouse name: Not on file   Number of children: Not on file   Years of education: Not on file   Highest education level: Not on file  Occupational History   Not on file  Tobacco Use   Smoking status: Every Day    Packs/day: 0.50    Years: 2.00    Pack years: 1.00    Types: Cigarettes   Smokeless tobacco: Never  Substance and Sexual Activity   Alcohol use: Yes    Alcohol/week: 1.0 standard drink    Types: 1 Shots of liquor per week    Comment: occasionally/ maybe once a month   Drug use: Yes    Types: Marijuana    Comment: once a month   Sexual activity: Yes    Birth control/protection: None  Other Topics Concern   Not on file  Social History Narrative   Not on file   Social Determinants of Health   Financial Resource Strain: Not on file  Food Insecurity: Not on file  Transportation Needs: Not on file  Physical Activity: Not on file  Stress: Not on  file  Social Connections: Not on file    Allergies:  Allergies  Allergen Reactions   Shrimp [Shellfish Allergy] Anaphylaxis    Metabolic Disorder Labs: Lab Results  Component Value Date   HGBA1C 5.6 10/21/2014   MPG 114 10/21/2014   No results found for: PROLACTIN Lab Results  Component Value Date   CHOL 138 10/21/2014   TRIG 139 10/21/2014   HDL 47 10/21/2014   CHOLHDL 2.9 10/21/2014   VLDL 28 10/21/2014   LDLCALC 63 10/21/2014   Lab Results  Component Value Date   TSH 1.054 10/21/2014    Therapeutic Level Labs: No results found for: LITHIUM No results found for: VALPROATE No components found for:  CBMZ  Current Medications: Current Outpatient Medications  Medication Sig Dispense Refill   doxepin (SINEQUAN) 25 MG capsule Take 1  capsule (25 mg total) by mouth at bedtime as needed. 30 capsule 1   ARIPiprazole ER (ABILIFY MAINTENA) 400 MG PRSY prefilled syringe INJECT 400MG  INTRAMUSCULARLY EVERY 30 DAYS AS DIRECTED (ADMINISTER WITHIN 30 MINUTES OF RECONSTITUTION) 1 each 11   gabapentin (NEURONTIN) 300 MG capsule Take 1 capsule (300 mg total) by mouth 2 (two) times daily. 60 capsule 1   Current Facility-Administered Medications  Medication Dose Route Frequency Provider Last Rate Last Admin   ARIPiprazole ER (ABILIFY MAINTENA) 400 MG prefilled syringe 400 mg  400 mg Intramuscular Q30 days , MD   400 mg at 08/11/21 1029     Musculoskeletal: Strength & Muscle Tone: within normal limits Gait & Station: normal Patient leans: N/A  Psychiatric Specialty Exam: Review of Systems  Constitutional: Negative.   HENT: Negative.    Eyes: Negative.   Respiratory: Negative.    Cardiovascular: Negative.   Gastrointestinal: Negative.   Genitourinary: Negative.   Musculoskeletal: Negative.   Skin: Negative.   Neurological: Negative.   Hematological: Negative.   Psychiatric/Behavioral:  Negative for agitation (Denies), behavioral problems, confusion, decreased concentration, dysphoric mood (Denies), hallucinations (Denies), self-injury (Denies), sleep disturbance (Denies) and suicidal ideas (Denies). The patient is not hyperactive. Nervous/anxious: stable.       Reports he continues to do well with the Abilify Maintena    There were no vitals taken for this visit.There is no height or weight on file to calculate BMI.  General Appearance: Casual and Appropriate  Eye Contact:  Good  Speech:  Clear and Coherent and Normal Rate  Volume:  Normal  Mood:  Euthymic and Pleasant  Affect:  Appropriate and Congruent  Thought Process:  Coherent, Goal Directed, and Descriptions of Associations: Intact  Orientation:  Full (Time, Place, and Person)  Thought Content: WDL and Logical   Suicidal Thoughts:  No  Homicidal  Thoughts:  No  Memory:  Immediate;   Good Recent;   Good Remote;   Good  Judgement:  Intact  Insight:  Present  Psychomotor Activity:  Normal  Concentration:  Concentration: Good and Attention Span: Good  Recall:  Good  Fund of Knowledge: Good  Language: Good  Akathisia:  Negative  Handed:  Right  AIMS (if indicated): done  Assets:  Communication Skills Desire for Improvement Resilience Social Support  ADL's:  Intact  Cognition: WNL  Sleep:  Good   Screenings: AIMS    Flowsheet Row Office Visit from 09/09/2021 in [facility] Office Visit from 05/11/2021 in [facility] Office Visit from 12/18/2020 in [facility]  AIMS Total Score 0 0 3  AUDIT    Flowsheet Row Admission (Discharged) from 10/17/2014 in [place] [facility] Admission (Discharged) from 07/24/2014 in [place] [facility] Admission (Discharged) from 12/02/2013 in [place] [facility]  Alcohol Use Disorder Identification Test Final Score (AUDIT) 1 0 0      GAD-7    Flowsheet Row Office Visit from 09/09/2021 in [facility]  Total GAD-7 Score 0      PHQ2-9    [place] Office Visit from 09/09/2021 in [facility] Office Visit from 05/11/2021 in [facility] Office Visit from 12/18/2020 in [place]  PHQ-2 Total Score 0 0 [place] Office Visit from 09/09/2021 in [facility] Office Visit from 05/11/2021 in [facility] Office Visit from 12/18/2020 in [place] No Risk No Risk No Risk      Assessment and Plan:  Patient appears to be doing well.  He is dressed appropriated for weather.  He is also excited about the  changes he has made such as cutting his hair.  He reports that medications are effective and managing his psychiatric condition.  During visit patient is sitting up right in chair, no distress noted.  He is alert/oriented x 4, calm/cooperative, and pleasant.  His mood is congruent with affect.  He spoke in a clear tone at moderate volume, and normal pace, and good eye contact.  His thought process is coherent and relevant; and there is no indication that he is currently responding to internal/external stimuli or experiencing delusional thought content.  Patient denies depression and anxiety, fluctuations in mood, manic episodes, and any abnormal movements.  He also denies suicidal/self-harm/homicidal ideation, psychosis, and paranoia.  Patient answered questions appropriately.  Patient received Abilify Maintena 400 mg IM with no difficulty and will return in one month for the next injection.      1. Schizoaffective disorder, bipolar type ([place]) - Lipid Profile - TSH - CBC with Differential - COMPLETE METABOLIC PANEL WITH GFR - Drug Screen, Urine - HgB A1c - ARIPiprazole ER (ABILIFY MAINTENA) 400 MG prefilled syringe 400 mg   He is no longer taking the Gabapentin 300 mg Tid for mood and only wanted a refill for Sinequan 25 mg for sleep just in case needed for sleep when close to time for next Abilify Maintena injection.  States he has needed it so far but wants to keep just in case.    Follow up in one month for Abilify Maintena injection  Haig Gerardo, NP 09/09/2021, 10:17 AM

## 2021-10-07 ENCOUNTER — Ambulatory Visit (HOSPITAL_COMMUNITY): Payer: No Payment, Other

## 2021-10-21 ENCOUNTER — Other Ambulatory Visit: Payer: Self-pay

## 2021-10-21 ENCOUNTER — Ambulatory Visit (INDEPENDENT_AMBULATORY_CARE_PROVIDER_SITE_OTHER): Payer: No Payment, Other | Admitting: *Deleted

## 2021-10-21 VITALS — BP 117/63 | HR 72 | Ht 72.0 in | Wt 178.0 lb

## 2021-10-21 DIAGNOSIS — F250 Schizoaffective disorder, bipolar type: Secondary | ICD-10-CM | POA: Diagnosis not present

## 2021-10-21 NOTE — Progress Notes (Signed)
PATIENT ARRIVED FOR INJECTION HAD TO LEAVE WORK TO COME TO APPT . PATIENT IS WORKING  @ [employer]  & SAYS IT'S GOOD FOR HIM.  PATIENT PLEASANT AS ALWAYS. TOLERATED INJECTION WELL IN Right-Arm.

## 2021-11-18 ENCOUNTER — Encounter (HOSPITAL_COMMUNITY): Payer: Self-pay

## 2021-11-18 ENCOUNTER — Ambulatory Visit (HOSPITAL_COMMUNITY): Payer: No Payment, Other | Admitting: *Deleted

## 2021-11-18 ENCOUNTER — Other Ambulatory Visit: Payer: Self-pay

## 2021-11-18 VITALS — BP 128/73 | HR 97 | Ht 72.0 in | Wt 177.0 lb

## 2021-11-18 DIAGNOSIS — F250 Schizoaffective disorder, bipolar type: Secondary | ICD-10-CM

## 2021-11-18 NOTE — Progress Notes (Signed)
In as planned for his monthly injection of Abilify M 400 mg today. He got his shot in his R DELTOID at his request as he had his R arm pulled out of his sweatshirt sleeve to have his vitals taken. He is here today with his case manager who waited on him in waiting room. He is unclear how he got assigned one but glad to have him, he states he is helping him with housing, disability, and he is his transport here. He offers no complaints re his mental health. He continues to work afternoons at [employer] and likes working there. He smells of marijuana as he usually does. He has a good personal appearance today. He is to return in one month for his next injection.  ?

## 2021-12-21 ENCOUNTER — Ambulatory Visit (HOSPITAL_COMMUNITY): Payer: No Payment, Other

## 2022-01-04 ENCOUNTER — Encounter (HOSPITAL_COMMUNITY): Payer: Self-pay

## 2022-01-04 ENCOUNTER — Ambulatory Visit (HOSPITAL_COMMUNITY): Payer: No Payment, Other

## 2022-01-04 ENCOUNTER — Ambulatory Visit (INDEPENDENT_AMBULATORY_CARE_PROVIDER_SITE_OTHER): Payer: No Payment, Other | Admitting: *Deleted

## 2022-01-04 VITALS — BP 155/94 | HR 77 | Ht 72.0 in | Wt 181.0 lb

## 2022-01-04 DIAGNOSIS — F250 Schizoaffective disorder, bipolar type: Secondary | ICD-10-CM

## 2022-01-04 NOTE — Progress Notes (Signed)
In as scheduled for his monthly injection of Abilify M 400 mg, given today in his L DELTOID. He was also to see the provider today but we no longer have a provider on Tues to help with clinic so went on without him seeing one. Scheduled for next visit for Thurs when we have a provider. He is continuing to work at [employer] and is doing well. He denies any issues and reports he is eating well, sleeping well and not having any psychotic sx. He is to return in 4 weeks for his next shot.  ?

## 2022-02-03 ENCOUNTER — Encounter (HOSPITAL_COMMUNITY): Payer: Self-pay | Admitting: Registered Nurse

## 2022-02-03 ENCOUNTER — Encounter (HOSPITAL_COMMUNITY): Payer: Self-pay

## 2022-02-03 ENCOUNTER — Ambulatory Visit (INDEPENDENT_AMBULATORY_CARE_PROVIDER_SITE_OTHER): Payer: No Payment, Other | Admitting: *Deleted

## 2022-02-03 ENCOUNTER — Ambulatory Visit (INDEPENDENT_AMBULATORY_CARE_PROVIDER_SITE_OTHER): Payer: No Payment, Other | Admitting: Registered Nurse

## 2022-02-03 VITALS — BP 136/73 | HR 72 | Ht 72.0 in | Wt 184.0 lb

## 2022-02-03 DIAGNOSIS — F250 Schizoaffective disorder, bipolar type: Secondary | ICD-10-CM | POA: Diagnosis not present

## 2022-02-03 MED ORDER — ABILIFY MAINTENA 400 MG IM PRSY: PREFILLED_SYRINGE | INTRAMUSCULAR | 11 refills | Status: DC

## 2022-02-03 NOTE — Progress Notes (Signed)
In accompanied by his case worker, but case worker remained in the lobby. She is helping him get housing and disability. He is pleasant, minimal verbally but appropriate. He offers no complaints. He got his shot in his R DELTOID without issues. He is going to see the provider today for his three month assessment, she is running late but he is able to wait. He is to return in one month for his next shot.

## 2022-02-03 NOTE — Progress Notes (Signed)
BH MD/PA/NP OP Progress Note  02/03/2022 9:30 AM Vincent [name]  MRN:  [id number]  Chief Complaint:  Chief Complaint  Patient presents with   Follow up psychiatric evaluation and long acting administra   HPI: Vincent [name], 25 yr. male with psychiatric history of schizoaffective disorder bipolar type.  He is managed with Abilify Maintena 400 mg every 28 days.  He is seen in office today for follow up psychiatric evaluation and administration of long acting injectable (Abilify Maintena).  He reports he is no longer taking the Doxepin and Gabapentin which were both discontinued at this visit.  He reports he is eating and sleeping without any difficulty.  He reports that he continues to do well and that Abilify Roderic Palau continues to help with mental health stability and denies any adverse reactions.  Vincent [name] denies depression, anxiety, suicidal/self-harm/homicidal ideation, psychosis, and paranoia, fluctuations in mood, and manic behavior.  He also denies any abnormal movements.   AIMS, GAD-7, PHQ-9, and CSRRS conducted by provider see scores below.   Patient is no longer homeless; he is currently staying with a friend and has a case Production designer, theatre/television/film that is helping him now.      Visit Diagnosis:    ICD-10-CM   1. Schizoaffective disorder, bipolar type (HCC)  F25.0 ARIPiprazole ER (ABILIFY MAINTENA) 400 MG PRSY prefilled syringe      Past Psychiatric History: Schizoaffective disorder bipolar type  Past Medical History:  Past Medical History:  Diagnosis Date   Anxiety     Past Surgical History:  Procedure Laterality Date   MANDIBLE SURGERY     WRIST SURGERY     laceration by glass that pt sts was closed with surgery    Family Psychiatric History: None reported  Family History: History reviewed. No pertinent family history.  Social History:  Social History   Socioeconomic History   Marital status: Single    Spouse name: Not on file   Number of children: Not on file    Years of education: Not on file   Highest education level: Not on file  Occupational History   Not on file  Tobacco Use   Smoking status: Every Day    Packs/day: 0.50    Years: 2.00    Pack years: 1.00    Types: Cigarettes   Smokeless tobacco: Never  Substance and Sexual Activity   Alcohol use: Yes    Alcohol/week: 1.0 standard drink    Types: 1 Shots of liquor per week    Comment: occasionally/ maybe once a month   Drug use: Yes    Types: Marijuana    Comment: once a month   Sexual activity: Yes    Birth control/protection: None  Other Topics Concern   Not on file  Social History Narrative   Not on file   Social Determinants of Health   Financial Resource Strain: Not on file  Food Insecurity: Not on file  Transportation Needs: Not on file  Physical Activity: Not on file  Stress: Not on file  Social Connections: Not on file    Allergies:  Allergies  Allergen Reactions   Shrimp [Shellfish Allergy] Anaphylaxis    Metabolic Disorder Labs: Lab Results  Component Value Date   HGBA1C 5.6 10/21/2014   MPG 114 10/21/2014   No results found for: PROLACTIN Lab Results  Component Value Date   CHOL 138 10/21/2014   TRIG 139 10/21/2014   HDL 47 10/21/2014   CHOLHDL 2.9 10/21/2014   VLDL 28  10/21/2014   LDLCALC 63 10/21/2014   Lab Results  Component Value Date   TSH 1.054 10/21/2014    Therapeutic Level Labs: No results found for: LITHIUM No results found for: VALPROATE No components found for:  CBMZ  Current Medications: Current Outpatient Medications  Medication Sig Dispense Refill   ARIPiprazole ER (ABILIFY MAINTENA) 400 MG PRSY prefilled syringe INJECT 400MG  INTRAMUSCULARLY EVERY 30 DAYS AS DIRECTED (ADMINISTER WITHIN 30 MINUTES OF RECONSTITUTION) 1 each 11   Current Facility-Administered Medications  Medication Dose Route Frequency Provider Last Rate Last Admin   ARIPiprazole ER (ABILIFY MAINTENA) 400 MG prefilled syringe 400 mg  400 mg Intramuscular  Q30 days , MD   400 mg at 02/03/22 0911     Musculoskeletal: Strength & Muscle Tone: within normal limits Gait & Station: normal Patient leans: N/A  Psychiatric Specialty Exam: Review of Systems  Constitutional: Negative.   HENT: Negative.    Eyes: Negative.   Respiratory: Negative.    Cardiovascular: Negative.   Gastrointestinal: Negative.   Genitourinary: Negative.   Musculoskeletal: Negative.   Skin: Negative.   Neurological: Negative.   Hematological: Negative.   Psychiatric/Behavioral:  Negative for agitation, behavioral problems, hallucinations, self-injury, sleep disturbance and suicidal ideas. The patient is not nervous/anxious.    There were no vitals taken for this visit.There is no height or weight on file to calculate BMI.  General Appearance: Casual  Eye Contact:  Good  Speech:  Clear and Coherent and Normal Rate  Volume:  Normal  Mood:  Euthymic  Affect:  Appropriate and Congruent  Thought Process:  Coherent, Goal Directed, and Descriptions of Associations: Intact  Orientation:  Full (Time, Place, and Person)  Thought Content: WDL and Logical   Suicidal Thoughts:  No  Homicidal Thoughts:  No  Memory:  Immediate;   Good Recent;   Good Remote;   Good  Judgement:  Intact  Insight:  Present  Psychomotor Activity:  Normal  Concentration:  Concentration: Good and Attention Span: Good  Recall:  Good  Fund of Knowledge: Good  Language: Good  Akathisia:  No  Handed:  Right  AIMS (if indicated): done  Assets:  Communication Skills Desire for Improvement Housing Physical Health Resilience Social Support  ADL's:  Intact  Cognition: WNL  Sleep:  Good   Screenings: AIMS    Flowsheet Row Office Visit from 02/03/2022 in [facility] Office Visit from 09/09/2021 in [facility] Office Visit from 05/11/2021 in [facility] Office Visit from 12/18/2020 in [facility]  AIMS Total Score 0 0 0 3      AUDIT    Flowsheet Row [facility]) [facility] Ad[facility] ([facility]  Alcohol Use Disorder Identification Test Final Score (AUDIT) 1 0 0      GAD-7    Flowsheet Row Office Visit from 02/03/2022 in [facility] Office Visit from 09/09/2021 in [facility]  Total GAD-7 Score 0 0      PHQ2-9    Flowsheet Row Office Visit from 02/03/2022 in [facility] Office Visit from 09/09/2021 in [facility] Office Visit from 05/11/2021 in [facility] Office Visit from 12/18/2020 in [place]  PHQ-2 Total Score 0  0 0 0      Flowsheet [facility]  C-SSRS RISK CATEGORY No Risk No Risk No Risk        Assessment and Plan: During visit patient is sitting upright in chair with distress noted.  He is dressed appropriate for weather.   He is alert/oriented x 4, calm/cooperative, and pleasant.  His mood is congruent with affect.  He spoke in a clear tone at moderate volume, and normal pace, with good eye contact.  His thought process is coherent and relevant; and there is no indication that he is currently responding to internal/external stimuli or experiencing delusional thought content.  Vincent [name] denies depression, anxiety, fluctuations in mood, manic episodes, abnormal movements, suicidal/self-harm/homicidal ideation, psychosis, and paranoia.  Doxepin and  Gabapentin were discontinued related to patient reporting he no longer needed and wasn't taking.    1. Schizoaffective disorder, bipolar type (HCC) - ARIPiprazole ER (ABILIFY MAINTENA) 400 MG PRSY prefilled syringe; INJECT 400MG  INTRAMUSCULARLY EVERY 30 DAYS AS DIRECTED (ADMINISTER WITHIN 30 MINUTES OF RECONSTITUTION)  Dispense: 1 each; Refill: 11     Collaboration of Care: Collaboration of Care: Medication Management AEB Injection of Abilify Maintena Meds ordered this encounter  Medications   ARIPiprazole ER (ABILIFY MAINTENA) 400 MG PRSY prefilled syringe    Sig: INJECT 400MG  INTRAMUSCULARLY EVERY 30 DAYS AS DIRECTED (ADMINISTER WITHIN 30 MINUTES OF RECONSTITUTION)    Dispense:  1 each    Refill:  11    Order Specific Question:   Supervising Provider    Answer:   Nelly RoutKUMAR, ARCHANA [[id number]]     Patient/Guardian was advised Release of Information must be obtained prior to any record release in order to collaborate their care with an outside provider. Patient/Guardian was advised if they have not already done so to contact the registration department to sign all necessary forms in order for us to release information regarding their care.   Consent: Patient/Guardian gives verbal consent for treatment and assignment of benefits for services provided during this visit. Patient/Guardian expressed understanding and agreed to proceed.   Follow up in 28 days  Cellie Dardis, NP 02/03/2022, 9:30 AM

## 2022-03-03 ENCOUNTER — Ambulatory Visit (INDEPENDENT_AMBULATORY_CARE_PROVIDER_SITE_OTHER): Payer: No Payment, Other | Admitting: *Deleted

## 2022-03-03 ENCOUNTER — Encounter (HOSPITAL_COMMUNITY): Payer: Self-pay

## 2022-03-03 VITALS — BP 143/79 | HR 85 | Ht 72.0 in | Wt 176.0 lb

## 2022-03-03 DIAGNOSIS — F250 Schizoaffective disorder, bipolar type: Secondary | ICD-10-CM | POA: Diagnosis not present

## 2022-03-03 NOTE — Progress Notes (Signed)
In as scheduled for his monthly injection of Abilify M 400 mg given today in his L DELTOID without difficulty. He states he quit working because it might affect him getting his disability, states he has a phone appt with disability today. He has a good personal appearance. Is pleasant and appropriate but offers little. He returns in 28 days for his next shot.

## 2022-03-08 ENCOUNTER — Other Ambulatory Visit (HOSPITAL_COMMUNITY): Payer: Self-pay | Admitting: Psychiatry

## 2022-03-08 MED ORDER — ABILIFY MAINTENA 400 MG IM SRER: 400.0000 mg | INTRAMUSCULAR | 11 refills | Status: DC

## 2022-03-31 ENCOUNTER — Encounter (HOSPITAL_COMMUNITY): Payer: Self-pay

## 2022-03-31 ENCOUNTER — Ambulatory Visit (INDEPENDENT_AMBULATORY_CARE_PROVIDER_SITE_OTHER): Payer: No Payment, Other | Admitting: *Deleted

## 2022-03-31 VITALS — BP 140/84 | HR 59 | Ht 72.0 in | Wt 185.0 lb

## 2022-03-31 DIAGNOSIS — F250 Schizoaffective disorder, bipolar type: Secondary | ICD-10-CM

## 2022-03-31 NOTE — Progress Notes (Signed)
In for scheduled injection for Abilify M 400 mg, given today in his R DELTOID without issue.  He is at his baseline, flat, pleasant and appropriate but doesn't offer conversation. Neat personal appearance. He offers no complaints. He is now working at [employer], cooking. He continues to work with a Sports coach and is pending a determination on his disability app. He is to return in 28 days and will discuss at his next appt changing his abilify to the new 2 month shot.

## 2022-04-28 ENCOUNTER — Encounter (HOSPITAL_COMMUNITY): Payer: Self-pay

## 2022-04-28 ENCOUNTER — Ambulatory Visit (INDEPENDENT_AMBULATORY_CARE_PROVIDER_SITE_OTHER): Payer: No Payment, Other | Admitting: *Deleted

## 2022-04-28 VITALS — BP 140/91 | HR 65 | Ht 72.0 in | Wt 187.0 lb

## 2022-04-28 DIAGNOSIS — F250 Schizoaffective disorder, bipolar type: Secondary | ICD-10-CM

## 2022-04-28 NOTE — Progress Notes (Signed)
Vincent [name] Arrived for Injection  ARIPiprazole ER (ABILIFY MAINTENA) 400 MG  Tolerated well in Right Arm , Looking well very well Facially Groomed, Pleasant as Well       NO SI/HI   NOR AH/VH

## 2022-05-26 ENCOUNTER — Ambulatory Visit (HOSPITAL_COMMUNITY): Payer: No Payment, Other

## 2022-06-09 ENCOUNTER — Encounter (HOSPITAL_COMMUNITY): Payer: Self-pay

## 2022-06-09 ENCOUNTER — Ambulatory Visit (HOSPITAL_COMMUNITY): Payer: No Payment, Other

## 2022-06-09 ENCOUNTER — Encounter (HOSPITAL_COMMUNITY): Payer: Self-pay | Admitting: Registered Nurse

## 2022-06-09 ENCOUNTER — Ambulatory Visit (INDEPENDENT_AMBULATORY_CARE_PROVIDER_SITE_OTHER): Payer: No Payment, Other | Admitting: Registered Nurse

## 2022-06-09 VITALS — BP 132/76 | HR 69 | Ht 72.0 in | Wt 176.0 lb

## 2022-06-09 DIAGNOSIS — F250 Schizoaffective disorder, bipolar type: Secondary | ICD-10-CM

## 2022-06-09 DIAGNOSIS — O039 Complete or unspecified spontaneous abortion without complication: Secondary | ICD-10-CM

## 2022-06-09 MED ORDER — ABILIFY MAINTENA 400 MG IM SRER: 400.0000 mg | INTRAMUSCULAR | 3 refills | Status: DC

## 2022-06-09 NOTE — Progress Notes (Signed)
PATIENT ARRIVED FOR INJECTION  WAS GIVEN IN RIGHT DELTOID, WAS GIVEN BY Daielle Melcher MA , PATIENT TOLERATED WELL STATES HE IS DOING WELL OVERALL

## 2022-06-09 NOTE — Progress Notes (Addendum)
BH MD/PA/NP OP Progress Note  06/09/2022 12:45 PM Vincent [name]  MRN:  [id number]  Chief Complaint:  Chief Complaint  Patient presents with   Follow up psychiatric evaluation and administration of long   HPI: Vincent [name], 25 yr. male seen face to face in office today for follow up psychiatric evaluation and administration of long acting injectable.  His psychiatric history includes schizoaffective disorder bipolar type.  His mental health is managed with Abilify Maintena 400 mg every 28 days.  He reports that Abilify Maintena manages his mental health well without adverse reaction but has noticed that he become irritated easily around the time for next injection or a week before.  States he also gets "wired up and a little hype."  Today is he 2 weeks past due for injection.  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  He reports that he is now living on his own but would not discuss the interactions that occurred that had him put out of his parents' home.  (Mother called office to inform that patient has been aggressive and angry.  Calling his mother, a bitch.  Informed that they had to put him out of the home because they could no longer manage him and that he is currently living in a motel).  Vincent [name] does admit that he is living in a motel, but he is getting assistance with [employer] to help find a place to live.  Discussed the importance of getting injection on time and if he felt it was wearing off a week before, could schedule for him to come in a week earlier.  He was brought in by his case manager today and could bring in for next visits if knew in advance.  A plan made that he would call his case manager today to arrange for his next appointment as soon as it was given.  He would also schedule his appointment on his phone with an alert the day before and an alert an hour before the day of  appointment.  He was assisted in setting up alerts prior to leaving office today.    He denies depression, anxiety, manic behavior, suicidal/self-harm/homicidal ideation, psychosis, paranoia, and abnormal movements.  Provider conducted.  He also denies any fluctuations in mood or manic behavior.   He reports Long acting injectable ([facility]) AIMS, GAD-7, PHQ-9, and CSRRS screenings see scores below. Vincent [name] also reports he has been eating/sleeping without difficulty.  Visit Diagnosis:    ICD-10-CM   1. Schizoaffective disorder, bipolar type (HCC)  F25.0      Past Psychiatric History: Schizoaffective disorder bipolar type, anxiety  Past Medical History:  Past Medical History:  Diagnosis Date   Anxiety     Past Surgical History:  Procedure Laterality Date   MANDIBLE SURGERY     WRIST SURGERY     laceration by glass that pt sts was closed with surgery    Family Psychiatric History: None reported  Family History: History reviewed. No pertinent family history.  Social History:  Social History   Socioeconomic History   Marital status: Single    Spouse name: Not on file   Number of children: Not on file   Years of education: Not on file   Highest education level: Not on file  Occupational History   Not on file  Tobacco Use   Smoking status: Every Day    Packs/day:  0.50    Years: 2.00    Total pack years: 1.00    Types: Cigarettes   Smokeless tobacco: Never  Substance and Sexual Activity   Alcohol use: Yes    Alcohol/week: 1.0 standard drink of alcohol    Types: 1 Shots of liquor per week    Comment: occasionally/ maybe once a month   Drug use: Yes    Types: Marijuana    Comment: once a month   Sexual activity: Yes    Birth control/protection: None  Other Topics Concern   Not on file  Social History Narrative   Not on file   Social Determinants of Health   Financial Resource Strain: Not on file  Food Insecurity: Not on file  Transportation Needs: Not on file   Physical Activity: Not on file  Stress: Not on file  Social Connections: Not on file    Allergies:  Allergies  Allergen Reactions   Shrimp [Shellfish Allergy] Anaphylaxis    Metabolic Disorder Labs: Lab Results  Component Value Date   HGBA1C 5.6 10/21/2014   MPG 114 10/21/2014   No results found for: "PROLACTIN" Lab Results  Component Value Date   CHOL 138 10/21/2014   TRIG 139 10/21/2014   HDL 47 10/21/2014   CHOLHDL 2.9 10/21/2014   VLDL 28 10/21/2014   LDLCALC 63 10/21/2014   Lab Results  Component Value Date   TSH 1.054 10/21/2014    Therapeutic Level Labs: No results found for: "LITHIUM" No results found for: "VALPROATE" No results found for: "CBMZ"  Current Medications: Current Outpatient Medications  Medication Sig Dispense Refill   ARIPiprazole ER (ABILIFY MAINTENA) 400 MG SRER injection Inject 2 mLs (400 mg total) into the muscle every 28 (twenty-eight) days. 1 each 11   Current Facility-Administered Medications  Medication Dose Route Frequency Provider Last Rate Last Admin   ARIPiprazole ER (ABILIFY MAINTENA) 400 MG prefilled syringe 400 mg  400 mg Intramuscular Q30 days Zena AmosKaur, Mandeep, MD   400 mg at 06/09/22 1221     Musculoskeletal: Strength & Muscle Tone: within normal limits Gait & Station: normal Patient leans: N/A  Psychiatric Specialty Exam: Review of Systems  Constitutional: Negative.   HENT: Negative.    Eyes: Negative.   Respiratory: Negative.    Cardiovascular: Negative.   Gastrointestinal: Negative.   Genitourinary: Negative.   Musculoskeletal: Negative.   Skin: Negative.   Neurological: Negative.   Hematological: Negative.   Psychiatric/Behavioral:  Positive for agitation (Reporting easily agitated atleast one week prior to next scheduled injection) and behavioral problems (Since last visit has gotten into verbal altercations at work and home.  Denies and physical altercations). Negative for confusion, dysphoric mood,  hallucinations, self-injury, sleep disturbance and suicidal ideas. The patient is not nervous/anxious.     There were no vitals taken for this visit.There is no height or weight on file to calculate BMI.  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  Calmed down after he was told he would be seen.  He the explained why he was upset and apologized.    Affect:  Appropriate and Congruent  Thought Process:  Coherent, Goal Directed, and Descriptions of Associations: Intact  Orientation:  Full (Time, Place, and Person)  Thought Content: Logical   Suicidal Thoughts:  No  Homicidal Thoughts:  No  Memory:  Immediate;   Good Recent;   Good Remote;   Good  Judgement:  Intact  Insight:  Present  Psychomotor Activity:  Normal  Concentration:  Concentration: Good and Attention Span: Good  Recall:  Good  Fund of Knowledge: Good  Language: Good  Akathisia:  No  Handed:  Right  AIMS (if indicated): done  Assets:  Communication Skills Desire for Improvement Physical Health Resilience Social Support  ADL's:  Intact  Cognition: WNL  Sleep:  Good   Screenings: AIMS    Flowsheet Row Office Visit from 06/09/2022 in [facility] Office Visit from 02/03/2022 in [facility] Office Visit from [facility] Office [facility] from 05/11/2021 in [facility] Office Visit from 12/18/2020 in [facility]  AIMS Total Score 0 0 0 0 3      AUDIT    Flowsheet Row [facility]) [facility] ([facility]  Alcohol Use Disorder Identification Test Final Score (AUDIT) 1 0 0      GAD-7    Flowsheet Row Office Visit from 06/09/2022 in [facility] Office Visit from 02/03/2022 in [facility] Office Visit from 09/09/2021 in [facility]  Total GAD-7 Score 0 0 0      PHQ2-9    Flowsheet Row Office Visit from 06/09/2022 in [facility] Office Visit from 02/03/2022 in [facility] Office Visit from 09/09/2021 in [facility] Office Visit from 05/11/2021 in [facility] Office Visit from 12/18/2020 in [facility]  PHQ-2 Total Score 0 0 0 0 0      Flowsheet Row Office Visit from 06/09/2022 in [facility] Office Visit from 02/03/2022 in [facility] Office Visit from 09/09/2021 in [facility]  C-SSRS RISK CATEGORY No Risk No Risk No Risk      Assessment and Plan:  Assessment:  Vincent [name] admits he could be doing better if he is to make his appointments on time.  Next visit scheduled for a week early and assisted with setting up alerts the day before visit and day of visit so he would not miss or be late.  Reported Abilify continues to help but feels wears off early cause mood instability (irritability, easily angered), and has agreed to come in a week earlier for next injection.  He denies any adverse reaction to Abilify Maintena.   During visit he is dressed appropriate for weather but soiled clothing since he is coming directly from work Buyer, retail).  He is sitting upright in chair with no noted distress.  He is alert/oriented x 4, calm and cooperative after settling  down when he was told he would be seen today.  His mood is then anxious but euthymic with congruent affect.  He spoke in a clear tone at moderate volume, and normal pace, with good eye contact.  His thought process is coherent, relevant; and there is no indication that he is currently responding to internal/external stimuli, or experiencing delusional thought content.  He denies depression, anxiety,  suicidal/self-harm/homicidal ideation, psychosis, paranoia, and abnormal movements.     Plan: Plan to come in week earlier for next scheduled Abilify Maintena injection Call his case worker to give date of scheduled appointment so will have transportation arranged Program date of appointment in his phone with alert the day before and an alert an hour before the day of appointment.   Collaboration of Care: Collaboration of Care: Medication Management AEB Refill of medications and administration of long acting injectable  1. Schizoaffective disorder, bipolar type (HCC) - ARIPiprazole ER (ABILIFY MAINTENA) 400 MG SRER injection; Inject 2 mLs (400 mg total) into the muscle every 28 (twenty-eight) days.  Dispense: 1 each; Refill: 3   Patient/Guardian was advised Release of Information must be obtained prior to any record release in order to collaborate their care with an outside provider. Patient/Guardian was advised if they have not already done so to contact the registration department to sign all necessary forms in order for [place] to release information regarding their care.   Consent: Patient/Guardian gives verbal consent for treatment and assignment of benefits for services provided during this visit. Patient/Guardian expressed understanding and agreed to proceed.   Follow up in one month  Kandee Escalante, NP 06/09/2022, 12:45 PM

## 2022-06-21 ENCOUNTER — Ambulatory Visit (HOSPITAL_COMMUNITY): Payer: No Payment, Other

## 2022-07-01 ENCOUNTER — Other Ambulatory Visit (HOSPITAL_COMMUNITY): Payer: Self-pay | Admitting: Pharmacist

## 2022-07-01 MED ORDER — ABILIFY MAINTENA 400 MG IM PRSY: 400.0000 mg | PREFILLED_SYRINGE | INTRAMUSCULAR | 10 refills | Status: DC

## 2022-07-07 ENCOUNTER — Encounter (HOSPITAL_COMMUNITY): Payer: Self-pay

## 2022-07-07 ENCOUNTER — Ambulatory Visit (INDEPENDENT_AMBULATORY_CARE_PROVIDER_SITE_OTHER): Payer: No Payment, Other | Admitting: *Deleted

## 2022-07-07 DIAGNOSIS — F250 Schizoaffective disorder, bipolar type: Secondary | ICD-10-CM | POA: Diagnosis not present

## 2022-07-07 NOTE — Progress Notes (Signed)
Patient arrived for Injection -- Abilify Maintena 400 mg-- Injection Tolerated Well in Left-Arm. Pleasant As Always Stated that he's Feeling really Good

## 2022-08-04 ENCOUNTER — Other Ambulatory Visit (HOSPITAL_COMMUNITY): Payer: Self-pay | Admitting: Registered Nurse

## 2022-08-04 ENCOUNTER — Ambulatory Visit (INDEPENDENT_AMBULATORY_CARE_PROVIDER_SITE_OTHER): Payer: No Payment, Other

## 2022-08-04 ENCOUNTER — Encounter (HOSPITAL_COMMUNITY): Payer: Self-pay

## 2022-08-04 VITALS — BP 133/77 | HR 92 | Resp 13 | Ht 72.0 in | Wt 185.0 lb

## 2022-08-04 DIAGNOSIS — F250 Schizoaffective disorder, bipolar type: Secondary | ICD-10-CM

## 2022-08-04 NOTE — Progress Notes (Cosign Needed)
PATIENT ARRIVED FOR INJECTION  WAS GIVEN IN RIGHT DELTOID, WAS GIVEN BY Maclane Holloran MA , PATIENT TOLERATED WELL STATES HE IS DOING WELL OVERALL  

## 2022-08-13 LAB — LIPID PANEL
Chol/HDL Ratio: 2.8 ratio (ref 0.0–5.0)
Cholesterol, Total: 138 mg/dL (ref 100–199)
HDL: 50 mg/dL (ref >39)
LDL Chol Calc (NIH): 74 mg/dL (ref 0–99)
Triglycerides: 70 mg/dL (ref 0–149)
VLDL Cholesterol Cal: 14 mg/dL (ref 5–40)

## 2022-08-13 LAB — TSH: TSH: 0.316 u[IU]/mL — ABNORMAL LOW (ref 0.450–4.500)

## 2022-08-13 LAB — CBC WITH DIFFERENTIAL/PLATELET
Basophils Absolute: 0.1 10*3/uL (ref 0.0–0.2)
Basos: 1 %
EOS (ABSOLUTE): 0.2 10*3/uL (ref 0.0–0.4)
Eos: 3 %
Hematocrit: 46.9 % (ref 37.5–51.0)
Hemoglobin: 15.8 g/dL (ref 13.0–17.7)
Immature Grans (Abs): 0 10*3/uL (ref 0.0–0.1)
Immature Granulocytes: 0 %
Lymphocytes Absolute: 2.1 10*3/uL (ref 0.7–3.1)
Lymphs: 30 %
MCH: 30.2 pg (ref 26.6–33.0)
MCHC: 33.7 g/dL (ref 31.5–35.7)
MCV: 90 fL (ref 79–97)
Monocytes Absolute: 0.5 10*3/uL (ref 0.1–0.9)
Monocytes: 8 %
Neutrophils Absolute: 4.1 10*3/uL (ref 1.4–7.0)
Neutrophils: 58 %
Platelets: 261 10*3/uL (ref 150–450)
RBC: 5.23 x10E6/uL (ref 4.14–5.80)
RDW: 13 % (ref 11.6–15.4)
WBC: 6.9 10*3/uL (ref 3.4–10.8)

## 2022-08-13 LAB — COMPREHENSIVE METABOLIC PANEL
ALT: 28 IU/L (ref 0–44)
AST: 27 IU/L (ref 0–40)
Albumin/Globulin Ratio: 1.5 (ref 1.2–2.2)
Albumin: 4.3 g/dL (ref 4.3–5.2)
Alkaline Phosphatase: 86 IU/L (ref 44–121)
BUN/Creatinine Ratio: 16 (ref 9–20)
BUN: 15 mg/dL (ref 6–20)
Bilirubin Total: 0.5 mg/dL (ref 0.0–1.2)
CO2: 25 mmol/L (ref 20–29)
Calcium: 9.9 mg/dL (ref 8.7–10.2)
Chloride: 100 mmol/L (ref 96–106)
Creatinine, Ser: 0.96 mg/dL (ref 0.76–1.27)
Globulin, Total: 2.9 g/dL (ref 1.5–4.5)
Potassium: 4.5 mmol/L (ref 3.5–5.2)
Sodium: 142 mmol/L (ref 134–144)
Total Protein: 7.2 g/dL (ref 6.0–8.5)
eGFR: 111 mL/min/{1.73_m2} (ref >59)

## 2022-08-13 LAB — HEMOGLOBIN A1C
Est. average glucose Bld gHb Est-mCnc: 117 mg/dL
Hgb A1c MFr Bld: 5.7 % — ABNORMAL HIGH (ref 4.8–5.6)

## 2022-08-13 LAB — MAGNESIUM: Magnesium: 1.9 mg/dL (ref 1.6–2.3)

## 2022-08-13 LAB — PROLACTIN: Prolactin: 0.8 ng/mL — ABNORMAL LOW (ref 4.0–15.2)

## 2022-09-01 ENCOUNTER — Ambulatory Visit (HOSPITAL_COMMUNITY): Payer: No Payment, Other

## 2022-09-06 ENCOUNTER — Encounter (HOSPITAL_COMMUNITY): Payer: Self-pay

## 2022-09-06 ENCOUNTER — Ambulatory Visit (HOSPITAL_COMMUNITY): Payer: No Payment, Other

## 2022-09-06 ENCOUNTER — Ambulatory Visit (INDEPENDENT_AMBULATORY_CARE_PROVIDER_SITE_OTHER): Payer: No Payment, Other | Admitting: Psychiatry

## 2022-09-06 VITALS — BP 136/83 | HR 100 | Resp 12 | Ht 72.0 in | Wt 185.0 lb

## 2022-09-06 DIAGNOSIS — O039 Complete or unspecified spontaneous abortion without complication: Secondary | ICD-10-CM

## 2022-09-06 DIAGNOSIS — F250 Schizoaffective disorder, bipolar type: Secondary | ICD-10-CM

## 2022-09-06 MED ORDER — ABILIFY MAINTENA 400 MG IM PRSY: 400.0000 mg | PREFILLED_SYRINGE | INTRAMUSCULAR | 10 refills | Status: DC

## 2022-09-06 MED ORDER — BENZTROPINE MESYLATE 0.5 MG PO TABS: 1.0000 mg | ORAL_TABLET | Freq: Two times a day (BID) | ORAL | 2 refills | Status: DC

## 2022-09-06 NOTE — Progress Notes (Signed)
PATIENT ARRIVED FOR INJECTION  WAS GIVEN IN RIGHT DELTOID, WAS GIVEN BY Tarshia Kot MA , PATIENT TOLERATED WELL STATES HE IS DOING WELL OVERALL

## 2022-09-06 NOTE — Progress Notes (Signed)
BH MD/PA/NP OP Progress Note  09/06/2022 2:32 PM Vincent [name]  MRN:  [id number]  Chief Complaint: "I am fine with my medications"  HPI: 26 year old male seen today for follow up psychiatric evaluation. He has a psychiatric history of substance induced psychotic disorder, Adjustment disorder, Schizoaffective disorder bipolar type, Substance induced mood disorder, cannabis induced hallucinations. He is currently managed on Abilify 400. He notes his medications are somewhat effective in managing his psychiatric conditions.  Today he is well groomed, pleasant, cooperative, and engaged in conversation. He informed Clinical research associate that he does well with his medications. He notes that his medications are like a car. He reports that when he is medicated he is like an automatic car which drives smoothly. He however notes that when he is unmediated his metal health is like a stick shift which he notes is all "over the place and clicks". Patient reports that mentally he feels stable and reports that he sees the benefits of his medications. He reports that his anxiety and depression are well managed. Today provider conducted a GAD 7 and patient scored a 7. Provider also conducted a PHQ 9 and patient scored a 5. He endorses adequate sleep and appetite. Today he denies SI/HI/VAH, mania, or paranoia.  Patient informed Clinical research associate that he is currently homeless. He reports that he stays at the [facility]. He reports that he is waiting on his disability to be approved so that he can rent a home of his own.  Patient notes that he occasional has abnormal muscle movements. Provider conducted an AIMS assessment and patient scored a 0 today. Today patient agreeable to start cogentin 0.5 mg twice daily. He will continue all other medications as prescribed.  Visit Diagnosis:    ICD-10-CM   1. Schizoaffective disorder, bipolar type (HCC)  F25.0 ARIPiprazole ER (ABILIFY MAINTENA) 400 MG PRSY prefilled syringe    benztropine (COGENTIN)  0.5 MG tablet      Past Psychiatric History: Schizoaffective disorder bipolar type, Substance induced mood disorder, cannabis induced hallucinations  Past Medical History:  Past Medical History:  Diagnosis Date   Anxiety     Past Surgical History:  Procedure Laterality Date   MANDIBLE SURGERY     WRIST SURGERY     laceration by glass that pt sts was closed with surgery    Family Psychiatric History: None reported   Family History: History reviewed. No pertinent family history.  Social History:  Social History   Socioeconomic History   Marital status: Single    Spouse name: Not on file   Number of children: Not on file   Years of education: Not on file   Highest education level: Not on file  Occupational History   Not on file  Tobacco Use   Smoking status: Every Day    Packs/day: 0.50    Years: 2.00    Total pack years: 1.00    Types: Cigarettes   Smokeless tobacco: Never  Substance and Sexual Activity   Alcohol use: Yes    Alcohol/week: 1.0 standard drink of alcohol    Types: 1 Shots of liquor per week    Comment: occasionally/ maybe once a month   Drug use: Yes    Types: Marijuana    Comment: once a month   Sexual activity: Yes    Birth control/protection: None  Other Topics Concern   Not on file  Social History Narrative   Not on file   Social Determinants of Corporate investment banker  Strain: Not on file  Food Insecurity: Not on file  Transportation Needs: Not on file  Physical Activity: Not on file  Stress: Not on file  Social Connections: Not on file    Allergies:  Allergies  Allergen Reactions   Shrimp [Shellfish Allergy] Anaphylaxis    Metabolic Disorder Labs: Lab Results  Component Value Date   HGBA1C 5.7 (H) 08/04/2022   MPG 114 10/21/2014   Lab Results  Component Value Date   PROLACTIN 0.8 (L) 08/04/2022   Lab Results  Component Value Date   CHOL 138 08/04/2022   TRIG 70 08/04/2022   HDL 50 08/04/2022   CHOLHDL 2.8  08/04/2022   VLDL 28 10/21/2014   LDLCALC 74 08/04/2022   LDLCALC 63 10/21/2014   Lab Results  Component Value Date   TSH 0.316 (L) 08/04/2022   TSH 1.054 10/21/2014    Therapeutic Level Labs: No results found for: "LITHIUM" No results found for: "VALPROATE" No results found for: "CBMZ"  Current Medications: Current Outpatient Medications  Medication Sig Dispense Refill   benztropine (COGENTIN) 0.5 MG tablet Take 2 tablets (1 mg total) by mouth 2 (two) times daily. 60 tablet 2   ARIPiprazole ER (ABILIFY MAINTENA) 400 MG PRSY prefilled syringe Inject 400 mg into the muscle every 28 (twenty-eight) days. 1 each 10   ARIPiprazole ER (ABILIFY MAINTENA) 400 MG SRER injection Inject 2 mLs (400 mg total) into the muscle every 28 (twenty-eight) days. 1 each 3   Current Facility-Administered Medications  Medication Dose Route Frequency Provider Last Rate Last Admin   ARIPiprazole ER (ABILIFY MAINTENA) 400 MG prefilled syringe 400 mg  400 mg Intramuscular Q30 days Zena Amos, MD   400 mg at 09/06/22 1330     Musculoskeletal: Strength & Muscle Tone: within normal limits Gait & Station: normal Patient leans: N/A  Psychiatric Specialty Exam: Review of Systems  There were no vitals taken for this visit.There is no height or weight on file to calculate BMI.  General Appearance: Well Groomed  Eye Contact:  Good  Speech:  Clear and Coherent and Normal Rate  Volume:  Normal  Mood:  Euthymic  Affect:  Appropriate and Congruent  Thought Process:  Coherent, Goal Directed, and Linear  Orientation:  Full (Time, Place, and Person)  Thought Content: WDL and Logical   Suicidal Thoughts:  No  Homicidal Thoughts:  No  Memory:  Immediate;   Good Recent;   Good Remote;   Good  Judgement:  Good  Insight:  Good  Psychomotor Activity:  Normal  Concentration:  Concentration: Good and Attention Span: Good  Recall:  Good  Fund of Knowledge: Good  Language: Good  Akathisia:  No  Handed:   Right  AIMS (if indicated): done, Score 0  Assets:  Communication Skills Desire for Improvement Financial Resources/Insurance Housing Leisure Time Physical Health Social Support  ADL's:  Intact  Cognition: WNL  Sleep:  Good   Screenings: AIMS    Flowsheet Row Office Visit from 06/09/2022 in [facility] Office Visit from 02/03/2022 in [facility] Office Visit from 09/09/2021 in [facility] Office Visit from 05/11/2021 in [facility] Office Visit from 12/18/2020 in [facility]  AIMS Total Score 0 0 0 0 3      AUDIT    Flowsheet Row [facility]) [facility]  Alcohol Use Disorder Identification Test Final Score (AUDIT) 1 0 0      GAD-7    Flowsheet Row Office Visit from 06/09/2022 in [facility] Office Visit from 02/03/2022 in [facility] Office Visit from 09/09/2021 in [facility]  Total GAD-7 Score 0 0 0      PHQ2-9    Flowsheet Row Office Visit from 06/09/2022 in [facility] Office Visit from 02/03/2022 in [facility] Office Visit from 09/09/2021 in [facility] Office Visit from 05/11/2021 in [facility] Office Visit from 12/18/2020 in [facility]  PHQ-2 Total Score 0 0 0 0 0      Flowsheet Row Office Visit from 06/09/2022 in [facility] Office Visit from 02/03/2022 in [facility] Office Visit from 09/09/2021 in [facility]  C-SSRS RISK CATEGORY No Risk No Risk No Risk        Assessment and Plan: Patient endorses symptoms of TD. No abnormal findings noted today. Patient was agreeable to starting Cogentin 0.5 twice daily to help manage symptoms. He will continue Abilify as prescribed.   1. Schizoaffective disorder, bipolar type (HCC)  Continue- ARIPiprazole ER (ABILIFY MAINTENA) 400 MG PRSY prefilled syringe; Inject 400 mg into the muscle every 28 (twenty-eight) days.  Dispense: 1 each; Refill: 10 Start- benztropine (COGENTIN) 0.5 MG tablet; Take 2 tablets (1 mg total) by mouth 2 (two) times daily.  Dispense: 60 tablet; Refill: 2   Collaboration of Care: Collaboration of Care: Other provider involved in patient's care AEB PCP  Patient/Guardian was advised Release of Information must be obtained prior to any record release in order to collaborate their care with an outside provider. Patient/Guardian was advised if they have not already done so to contact the registration department to sign all necessary forms in order for [place] to release information regarding their care.   Consent: Patient/Guardian gives verbal consent for treatment and assignment of benefits for services provided during this visit. Patient/Guardian expressed understanding and agreed to proceed.    Shanna Cisco, NP 09/06/2022, 2:32 PM

## 2022-10-04 ENCOUNTER — Ambulatory Visit (HOSPITAL_COMMUNITY): Payer: No Payment, Other

## 2022-10-06 ENCOUNTER — Ambulatory Visit (INDEPENDENT_AMBULATORY_CARE_PROVIDER_SITE_OTHER): Payer: No Payment, Other

## 2022-10-06 ENCOUNTER — Encounter (HOSPITAL_COMMUNITY): Payer: Self-pay

## 2022-10-06 VITALS — BP 131/73 | HR 96 | Resp 13 | Ht 72.0 in | Wt 191.2 lb

## 2022-10-06 DIAGNOSIS — F250 Schizoaffective disorder, bipolar type: Secondary | ICD-10-CM

## 2022-10-06 NOTE — Progress Notes (Signed)
Patient arrived for Injection -- Abilify Maintena 400 mg-- Injection Tolerated Well in Left-Arm. Pleasant As Always

## 2022-11-01 ENCOUNTER — Ambulatory Visit: Payer: Self-pay | Admitting: Family Medicine

## 2022-11-03 ENCOUNTER — Ambulatory Visit (HOSPITAL_COMMUNITY): Payer: Medicaid Other

## 2022-11-24 ENCOUNTER — Ambulatory Visit (INDEPENDENT_AMBULATORY_CARE_PROVIDER_SITE_OTHER): Payer: Medicaid Other | Admitting: *Deleted

## 2022-11-24 ENCOUNTER — Encounter (HOSPITAL_COMMUNITY): Payer: Self-pay

## 2022-11-24 ENCOUNTER — Telehealth (HOSPITAL_COMMUNITY): Payer: Self-pay | Admitting: *Deleted

## 2022-11-24 ENCOUNTER — Ambulatory Visit (INDEPENDENT_AMBULATORY_CARE_PROVIDER_SITE_OTHER): Payer: Medicaid Other | Admitting: Psychiatry

## 2022-11-24 VITALS — BP 140/86 | HR 84 | Ht 72.0 in | Wt 198.0 lb

## 2022-11-24 DIAGNOSIS — F250 Schizoaffective disorder, bipolar type: Secondary | ICD-10-CM | POA: Diagnosis not present

## 2022-11-24 MED ORDER — ABILIFY MAINTENA 400 MG IM PRSY: 400.0000 mg | PREFILLED_SYRINGE | INTRAMUSCULAR | 10 refills | Status: DC

## 2022-11-24 MED ORDER — BENZTROPINE MESYLATE 0.5 MG PO TABS: 1.0000 mg | ORAL_TABLET | Freq: Two times a day (BID) | ORAL | 3 refills | Status: DC

## 2022-11-24 NOTE — Progress Notes (Signed)
BH MD/PA/NP OP Progress Note  11/24/2022 2:17 PM Vincent [name]  MRN:  [id number]  Chief Complaint: "I have been hyper"  HPI: 27 year old male seen today for follow up psychiatric evaluation. He has a psychiatric history of substance induced psychotic disorder, Adjustment disorder, Schizoaffective disorder bipolar type, Substance induced mood disorder, cannabis induced hallucinations. He is currently managed on Abilify 400 and cogentin 0.5 mg twice daily. He notes his medications are somewhat effective in managing his psychiatric conditions. Patient has not had his injection since December.  Today he is poorly groomed, pleasant, cooperative, and engaged in conversation. He informed Probation officer that he has been hyper. Today patient speech was rapid. He endorses symptoms of hypomania such as racing thoughts, irritability, distractibility, fluctuations in mood and paranoia. He notes that he has minimal anxiety and depression. Today provider conducted a GAD 7 and patient scored a 7.  Provider also conducted a PHQ 9 and patient scored a 9. He endorses adequate sleep and appetite. Today he denies SI/HI/VAH, mania, or paranoia.  Patient informed Probation officer that he is currently homeless. He reports that he stays at the [facility].   Today patient restarted on Abilify Maintena 400 mg monthly injection. Patient agreeable to continue medication as prescribed. No other concerns noted at this time.  Visit Diagnosis:    ICD-10-CM   1. Schizoaffective disorder, bipolar type ([place])  F25.0 benztropine (COGENTIN) 0.5 MG tablet    ARIPiprazole ER (ABILIFY MAINTENA) 400 MG PRSY prefilled syringe      Past Psychiatric History: Schizoaffective disorder bipolar type, Substance induced mood disorder, cannabis induced hallucinations  Past Medical History:  Past Medical History:  Diagnosis Date   Anxiety     Past Surgical History:  Procedure Laterality Date   MANDIBLE SURGERY     WRIST SURGERY     laceration by glass  that pt sts was closed with surgery    Family Psychiatric History: None reported   Family History: No family history on file.  Social History:  Social History   Socioeconomic History   Marital status: Single    Spouse name: Not on file   Number of children: Not on file   Years of education: Not on file   Highest education level: Not on file  Occupational History   Not on file  Tobacco Use   Smoking status: Every Day    Packs/day: 0.50    Years: 2.00    Total pack years: 1.00    Types: Cigarettes   Smokeless tobacco: Never  Substance and Sexual Activity   Alcohol use: Yes    Alcohol/week: 1.0 standard drink of alcohol    Types: 1 Shots of liquor per week    Comment: occasionally/ maybe once a month   Drug use: Yes    Types: Marijuana    Comment: once a month   Sexual activity: Yes    Birth control/protection: None  Other Topics Concern   Not on file  Social History Narrative   Not on file   Social Determinants of Health   Financial Resource Strain: Not on file  Food Insecurity: Not on file  Transportation Needs: Not on file  Physical Activity: Not on file  Stress: Not on file  Social Connections: Not on file    Allergies:  Allergies  Allergen Reactions   Shrimp [Shellfish Allergy] Anaphylaxis    Metabolic Disorder Labs: Lab Results  Component Value Date   HGBA1C 5.7 (H) 08/04/2022   MPG 114 10/21/2014  Lab Results  Component Value Date   PROLACTIN 0.8 (L) 08/04/2022   Lab Results  Component Value Date   CHOL 138 08/04/2022   TRIG 70 08/04/2022   HDL 50 08/04/2022   CHOLHDL 2.8 08/04/2022   VLDL 28 10/21/2014   LDLCALC 74 08/04/2022   LDLCALC 63 10/21/2014   Lab Results  Component Value Date   TSH 0.316 (L) 08/04/2022   TSH 1.054 10/21/2014    Therapeutic Level Labs: No results found for: "LITHIUM" No results found for: "VALPROATE" No results found for: "CBMZ"  Current Medications: Current Outpatient Medications  Medication Sig  Dispense Refill   ARIPiprazole ER (ABILIFY MAINTENA) 400 MG PRSY prefilled syringe Inject 400 mg into the muscle every 28 (twenty-eight) days. 1 each 10   ARIPiprazole ER (ABILIFY MAINTENA) 400 MG SRER injection Inject 2 mLs (400 mg total) into the muscle every 28 (twenty-eight) days. 1 each 3   benztropine (COGENTIN) 0.5 MG tablet Take 2 tablets (1 mg total) by mouth 2 (two) times daily. 60 tablet 3   Current Facility-Administered Medications  Medication Dose Route Frequency Provider Last Rate Last Admin   ARIPiprazole ER (ABILIFY MAINTENA) 400 MG prefilled syringe 400 mg  400 mg Intramuscular Q30 days Nevada Crane, MD   400 mg at 10/06/22 [id number]     Musculoskeletal: Strength & Muscle Tone: within normal limits Gait & Station: normal Patient leans: N/A  Psychiatric Specialty Exam: Review of Systems  There were no vitals taken for this visit.There is no height or weight on file to calculate BMI.  General Appearance: Disheveled  Eye Contact:  Good  Speech:  Clear and Coherent and Normal Rate  Volume:  Normal  Mood:  Euthymic  Affect:  Appropriate and Congruent  Thought Process:  Coherent, Goal Directed, and Linear  Orientation:  Full (Time, Place, and Person)  Thought Content: Logical and Paranoid Ideation   Suicidal Thoughts:  No  Homicidal Thoughts:  No  Memory:  Immediate;   Good Recent;   Good Remote;   Good  Judgement:  Good  Insight:  Good  Psychomotor Activity:  Normal  Concentration:  Concentration: Good and Attention Span: Good  Recall:  Good  Fund of Knowledge: Good  Language: Good  Akathisia:  No  Handed:  Right  AIMS (if indicated): done, Score 0  Assets:  Communication Skills Desire for Improvement Financial Resources/Insurance Housing Leisure Time Physical Health Social Support  ADL's:  Intact  Cognition: WNL  Sleep:  Good   Screenings: [place] Office Visit from 06/09/2022 in [facility] Office Visit from  02/03/2022 in [facility] Office Visit from 09/09/2021 in [facility] Office Visit from 05/11/2021 in [facility] Office Visit from 12/18/2020 in [place] Total Score 0 0 0 0 3      AUDIT    Flowsheet Row Admission (Discharged) from 10/17/2014 in [place] [facility] Admission (Discharged) from 07/24/2014 in [place] [facility] Admission (Discharged) from 12/02/2013 in [place] [facility]  Alcohol Use Disorder Identification Test Final Score (AUDIT) 1 0 0      GAD-7    Flowsheet Row Office Visit from 11/24/2022 in [facility] Office Visit from 06/09/2022 in [facility] Office Visit from 02/03/2022 in [facility] Office Visit from 09/09/2021 in [facility]  Total GAD-7 Score 7 0 0 0      PHQ2-9    [place] Office Visit from 06/09/2022 in [facility] Office Visit from 02/03/2022 in [facility] Office Visit from 09/09/2021 in [facility] Office Visit from 05/11/2021 in [facility] Office Visit from 12/18/2020 in [facility]  PHQ-2 Total Score 0 0 0 0 0      [place] Office Visit from 06/09/2022 in [facility] Office Visit from 02/03/2022 in [facility] Office Visit from 09/09/2021 in [place] No Risk No Risk No Risk        Assessment and Plan: Patient endorses symptoms of hypomania since being without his injection for 3 months. Today patient restarted on Abilify Maintena 400 mg monthly injection. Patient agreeable to continue  medication as prescribed. No other concerns noted at this time.  1. Schizoaffective disorder, bipolar type (HCC)  Restart- ARIPiprazole ER (ABILIFY MAINTENA) 400 MG PRSY prefilled syringe; Inject 400 mg into the muscle every 28 (twenty-eight) days.  Dispense: 1 each; Refill: 10 Continue- benztropine (COGENTIN) 0.5 MG tablet; Take 2 tablets (1 mg total) by mouth 2 (two) times daily.  Dispense: 60 tablet; Refill: 2   Collaboration of Care: Collaboration of Care: Other provider involved in patient's care AEB PCP  Patient/Guardian was advised Release of Information must be obtained prior to any record release in order to collaborate their care with an outside provider. Patient/Guardian was advised if they have not already done so to contact the registration department to sign all necessary forms in order for [place] to release information regarding their care.   Consent: Patient/Guardian gives verbal consent for treatment and assignment of benefits for services provided during this visit. Patient/Guardian expressed understanding and agreed to proceed.   Follow up in 1 month with nursing staff Follow up in 3 months for medication management Salley Slaughter, NP 11/24/2022, 2:17 PM

## 2022-11-24 NOTE — Telephone Encounter (Signed)
Spoke with patients mom to follow up with his recent missed inj appts. States he is at the [facility] and working with a Tourist information centre manager. Mom not aware he is behind. She will reach out to the case worker and try to get him in on a clinic day.

## 2022-11-24 NOTE — Progress Notes (Signed)
In several months late for his shot today, I called and spoke with his mom earlier today to remind her son to come in. [place] desk staff came back to nurse area and said he was "cutting up" could they bring him back. He states he can tell he needed a shot, but states someone called him and told him to come on 4/18. Thinks now it was a mix up. He is appropriate with Probation officer but states he doesn't like to be around people "tired of messing with em" he got his Abilify M 400 mg inj in his R DELTOID without issue. He is living at the [facility] center, smells strongly of BO and dirt. He is to return in 28 days for his next shot.  He also was seen by Dr Ronne Binning to day for his 3 month assessment. See her note.

## 2022-12-06 ENCOUNTER — Ambulatory Visit (HOSPITAL_COMMUNITY): Payer: Medicaid Other

## 2022-12-22 ENCOUNTER — Ambulatory Visit (HOSPITAL_COMMUNITY): Payer: Medicaid Other

## 2022-12-28 ENCOUNTER — Ambulatory Visit: Payer: Self-pay | Admitting: Family Medicine

## 2022-12-29 ENCOUNTER — Encounter (HOSPITAL_COMMUNITY): Payer: Self-pay

## 2022-12-29 ENCOUNTER — Ambulatory Visit (INDEPENDENT_AMBULATORY_CARE_PROVIDER_SITE_OTHER): Payer: Medicaid Other | Admitting: *Deleted

## 2022-12-29 ENCOUNTER — Ambulatory Visit (INDEPENDENT_AMBULATORY_CARE_PROVIDER_SITE_OTHER): Payer: Medicaid Other | Admitting: Psychiatry

## 2022-12-29 VITALS — BP 153/94 | HR 68 | Ht 72.0 in | Wt 200.0 lb

## 2022-12-29 DIAGNOSIS — G2401 Drug induced subacute dyskinesia: Secondary | ICD-10-CM | POA: Diagnosis not present

## 2022-12-29 DIAGNOSIS — F250 Schizoaffective disorder, bipolar type: Secondary | ICD-10-CM

## 2022-12-29 DIAGNOSIS — F411 Generalized anxiety disorder: Secondary | ICD-10-CM

## 2022-12-29 MED ORDER — VALBENAZINE TOSYLATE 40 MG PO CAPS: 40.0000 mg | ORAL_CAPSULE | Freq: Every day | ORAL | 3 refills | Status: DC

## 2022-12-29 MED ORDER — GABAPENTIN 300 MG PO CAPS: 300.0000 mg | ORAL_CAPSULE | Freq: Three times a day (TID) | ORAL | 3 refills | Status: DC

## 2022-12-29 MED ORDER — BENZTROPINE MESYLATE 0.5 MG PO TABS: 1.0000 mg | ORAL_TABLET | Freq: Two times a day (BID) | ORAL | 3 refills | Status: DC

## 2022-12-29 MED ORDER — ABILIFY MAINTENA 400 MG IM PRSY: 400.0000 mg | PREFILLED_SYRINGE | INTRAMUSCULAR | 10 refills | Status: DC
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
## 2022-12-29 NOTE — Progress Notes (Signed)
In a week late for his monthly inj of Abilify M 400 mg. Given today in his L DELTOID without issue.He is now living with his mom, at least till he gets his disability and his own apartment. Mom is present with him. She is asking for a mood stabilizer and something for his movements. Dr Doyne Keel saw patient to assess this request. Please see her note. He is to return in 28 days for his next shot and discussed with mom the importance of compliance with his shot to help with his stability. He has no complaints.

## 2023-01-06 ENCOUNTER — Other Ambulatory Visit: Payer: Self-pay

## 2023-01-06 ENCOUNTER — Encounter (HOSPITAL_BASED_OUTPATIENT_CLINIC_OR_DEPARTMENT_OTHER): Payer: Self-pay | Admitting: Emergency Medicine

## 2023-01-06 ENCOUNTER — Other Ambulatory Visit (HOSPITAL_BASED_OUTPATIENT_CLINIC_OR_DEPARTMENT_OTHER): Payer: Self-pay

## 2023-01-06 ENCOUNTER — Emergency Department (HOSPITAL_BASED_OUTPATIENT_CLINIC_OR_DEPARTMENT_OTHER)
Admission: EM | Admit: 2023-01-06 | Discharge: 2023-01-06 | Disposition: A | Discharge status: Home / Self Care | Payer: Medicaid Other | Attending: Emergency Medicine | Admitting: Emergency Medicine

## 2023-01-06 DIAGNOSIS — K029 Dental caries, unspecified: Secondary | ICD-10-CM | POA: Diagnosis not present

## 2023-01-06 DIAGNOSIS — H66001 Acute suppurative otitis media without spontaneous rupture of ear drum, right ear: Secondary | ICD-10-CM | POA: Diagnosis not present

## 2023-01-06 DIAGNOSIS — H9201 Otalgia, right ear: Secondary | ICD-10-CM | POA: Diagnosis present

## 2023-01-06 MED ORDER — AMOXICILLIN-POT CLAVULANATE 875-125 MG PO TABS
1.0000 | ORAL_TABLET | Freq: Two times a day (BID) | ORAL | 0 refills | Status: DC
  Filled 2023-01-06: qty 14, 7d supply, fill #0

## 2023-01-06 MED ORDER — IBUPROFEN 800 MG PO TABS
800.0000 mg | ORAL_TABLET | Freq: Three times a day (TID) | ORAL | 0 refills | Status: DC
  Filled 2023-01-06: qty 21, 7d supply, fill #0

## 2023-01-06 NOTE — Discharge Instructions (Signed)
1.  Start taking Augmentin today as prescribed.  You may take ibuprofen every 8 hours with food for pain control.  Do not need to take the ibuprofen if you do not have pain. 2.  Schedule a follow-up appointment with your doctor to make sure that your infection is cleared.  You need to see a dentist as soon as possible.  You have extensive amount of dental decay in your right lower molar. 3.  Return to emergency department for recheck if you have new worsening or concerning symptoms.

## 2023-01-06 NOTE — ED Provider Notes (Signed)
Vincent [name] Provider Note   CSN: [id number] Arrival date & time: 01/06/23  0759     History  Chief Complaint  Patient presents with   Otalgia    Vincent [name] is a 27 y.o. male.  HPI Patient started noticed pain in his right ear yesterday.  He reports it is also now feels really muffled and feels like there might be "air behind the eardrum".  No history of recurrent ear infections.  Recently started Neurontin as a new medication.  Patient reports he has had a problem with the right lower tooth decay for a number of months.  He denies this is any worse than previously.  No fevers or neck stiffness.    Home Medications Prior to Admission medications   Medication Sig Start Date End Date Taking? Authorizing Provider  amoxicillin-clavulanate (AUGMENTIN) 875-125 MG tablet Take 1 tablet by mouth every 12 (twelve) hours. 01/06/23  Yes Arby Barrette, MD  ibuprofen (ADVIL) 800 MG tablet Take 1 tablet (800 mg total) by mouth 3 (three) times daily. 01/06/23  Yes Arby Barrette, MD  ARIPiprazole ER (ABILIFY MAINTENA) 400 MG PRSY prefilled syringe Inject 400 mg into the muscle every 28 (twenty-eight) days. 12/29/22   Shanna Cisco, NP  gabapentin (NEURONTIN) 300 MG capsule Take 1 capsule (300 mg total) by mouth 3 (three) times daily. 12/29/22   Shanna Cisco, NP  valbenazine [name]) 40 MG capsule Take 1 capsule (40 mg total) by mouth daily. 12/29/22   Shanna Cisco, NP      Allergies    Shrimp [shellfish allergy]    Review of Systems   Review of Systems  Physical Exam Updated Vital Signs BP (!) 165/106 (BP Location: Left Arm)   Pulse 63   Temp 98.5 F (36.9 C) (Oral)   Resp 18   Ht 6' (1.829 m)   Wt 90.7 kg   SpO2 100%   BMI 27.12 kg/m  Physical Exam Constitutional:      Comments: Patient is alert nontoxic.  No acute distress.  HENT:     Head:     Comments: No objective facial swelling.  Patient is  nontender to palpation along the maxilla and infra fibular area.    Ears:     Comments: Right TM is erythematous and bulging.  No rupture.  Ear canals clear.  Left TM normal.    Nose: Nose normal.     Mouth/Throat:     Comments: Mucous membranes pink and moist.  Patient has an inferior right third molar with extensive decay including about 60% of the tooth to the gumline.  No drainage and no surrounding erythema or inflammation of the gum.  Posterior oropharynx is widely patent.  Dentition otherwise in good to fair condition.  Mucous membranes pink moist without lesions.  Normal range of motion of the jaw no trismus. Eyes:     Extraocular Movements: Extraocular movements intact.     Conjunctiva/sclera: Conjunctivae normal.     Pupils: Pupils are equal, round, and reactive to light.  Neck:     Comments: Neck is supple.  Normal range of motion of the jaw. Pulmonary:     Effort: Pulmonary effort is normal.  Skin:    General: Skin is warm and dry.  Neurological:     Mental Status: He is oriented to person, place, and time.  Psychiatric:        Mood and Affect: Mood normal.     ED  Results / Procedures / Treatments   Labs (all labs ordered are listed, but only abnormal results are displayed) Labs Reviewed - No data to display  EKG None  Radiology No results found.  Procedures Procedures    Medications Ordered in ED Medications - No data to display  ED Course/ Medical Decision Making/ A&P                             Medical Decision Making  Patient presents with a fairly acute onset of ear pain.  On examination he does have erythema and bulging of the right TM consistent with an otitis media.  Ear canal and external ear are normal.  Patient incidentally has significant decay of the third molar on the right.  At this point however he does not appear to have significant associated abscess.  There is not associated facial swelling, trismus or pain.  Patient will be started on  Augmentin.  Patient has plans for follow-up with dentistry.  I recommend follow-up for recheck of the ear for resolution of otitis media as well.  Patient has cognitive/intellectual limitations, he has mother is at bedside who is managing healthcare and all plans and findings have been reviewed.        Final Clinical Impression(s) / ED Diagnoses Final diagnoses:  Non-recurrent acute suppurative otitis media of right ear without spontaneous rupture of tympanic membrane  Dental caries into pulp    Rx / DC Orders ED Discharge Orders          Ordered    amoxicillin-clavulanate (AUGMENTIN) 875-125 MG tablet  Every 12 hours        01/06/23 0852    ibuprofen (ADVIL) 800 MG tablet  3 times daily        01/06/23 8413              Arby Barrette, MD 01/06/23 [phone]

## 2023-01-06 NOTE — ED Notes (Signed)
Discharge instructions discussed with pt. Pt verbalized understanding. Pt stable and ambulatory.  °

## 2023-01-06 NOTE — ED Triage Notes (Signed)
Pt via pov from home with ear pain and fullness in right ear since yesterday. Pt also c/o some tooth pain on right side. Pt alert & oriented, nad noted.

## 2023-01-23 ENCOUNTER — Ambulatory Visit (HOSPITAL_COMMUNITY)
Admission: EM | Admit: 2023-01-23 | Discharge: 2023-01-23 | Disposition: A | Discharge status: Other Acute Inpatient Hospital | Payer: Medicaid Other | Attending: Registered Nurse | Admitting: Registered Nurse

## 2023-01-23 ENCOUNTER — Encounter (HOSPITAL_COMMUNITY): Payer: Self-pay

## 2023-01-23 ENCOUNTER — Other Ambulatory Visit: Payer: Self-pay

## 2023-01-23 ENCOUNTER — Emergency Department (EMERGENCY_DEPARTMENT_HOSPITAL)
Admission: EM | Admit: 2023-01-23 | Discharge: 2023-01-24 | Disposition: A | Discharge status: Home / Self Care | Payer: Medicaid Other | Source: Home / Self Care | Attending: Emergency Medicine | Admitting: Emergency Medicine

## 2023-01-23 ENCOUNTER — Encounter (HOSPITAL_COMMUNITY): Payer: Self-pay | Admitting: Registered Nurse

## 2023-01-23 DIAGNOSIS — F1220 Cannabis dependence, uncomplicated: Secondary | ICD-10-CM | POA: Diagnosis present

## 2023-01-23 DIAGNOSIS — Z79899 Other long term (current) drug therapy: Secondary | ICD-10-CM | POA: Insufficient documentation

## 2023-01-23 DIAGNOSIS — F250 Schizoaffective disorder, bipolar type: Secondary | ICD-10-CM | POA: Insufficient documentation

## 2023-01-23 DIAGNOSIS — R61 Generalized hyperhidrosis: Secondary | ICD-10-CM | POA: Insufficient documentation

## 2023-01-23 DIAGNOSIS — F411 Generalized anxiety disorder: Secondary | ICD-10-CM

## 2023-01-23 DIAGNOSIS — R4689 Other symptoms and signs involving appearance and behavior: Secondary | ICD-10-CM

## 2023-01-23 HISTORY — DX: Schizophrenia, unspecified: F20.9

## 2023-01-23 LAB — RAPID URINE DRUG SCREEN, HOSP PERFORMED
Amphetamines: POSITIVE — AB
Barbiturates: NOT DETECTED
Benzodiazepines: NOT DETECTED
Cocaine: POSITIVE — AB
Opiates: NOT DETECTED
Tetrahydrocannabinol: POSITIVE — AB

## 2023-01-23 LAB — COMPREHENSIVE METABOLIC PANEL
ALT: 46 U/L — ABNORMAL HIGH (ref 0–44)
AST: 87 U/L — ABNORMAL HIGH (ref 15–41)
Albumin: 4.8 g/dL (ref 3.5–5.0)
Alkaline Phosphatase: 82 U/L (ref 38–126)
Anion gap: 7 (ref 5–15)
BUN: 17 mg/dL (ref 6–20)
CO2: 28 mmol/L (ref 22–32)
Calcium: 9.2 mg/dL (ref 8.9–10.3)
Chloride: 107 mmol/L (ref 98–111)
Creatinine, Ser: 1.26 mg/dL — ABNORMAL HIGH (ref 0.61–1.24)
GFR, Estimated: >60 mL/min (ref >60)
Glucose, Bld: 77 mg/dL (ref 70–99)
Potassium: 3.7 mmol/L (ref 3.5–5.1)
Sodium: 142 mmol/L (ref 135–145)
Total Bilirubin: 1.1 mg/dL (ref 0.3–1.2)
Total Protein: 7.9 g/dL (ref 6.5–8.1)

## 2023-01-23 LAB — CBC
HCT: 45.9 % (ref 39.0–52.0)
Hemoglobin: 15.4 g/dL (ref 13.0–17.0)
MCH: 30.1 pg (ref 26.0–34.0)
MCHC: 33.6 g/dL (ref 30.0–36.0)
MCV: 89.6 fL (ref 80.0–100.0)
Platelets: 258 10*3/uL (ref 150–400)
RBC: 5.12 MIL/uL (ref 4.22–5.81)
RDW: 14.2 % (ref 11.5–15.5)
WBC: 11.5 10*3/uL — ABNORMAL HIGH (ref 4.0–10.5)
nRBC: 0 % (ref 0.0–0.2)

## 2023-01-23 LAB — ETHANOL: Alcohol, Ethyl (B): <10 mg/dL (ref <10)

## 2023-01-23 MED ORDER — GABAPENTIN 300 MG PO CAPS
300.0000 mg | ORAL_CAPSULE | Freq: Three times a day (TID) | ORAL | Status: DC
  Administered 2023-01-23 – 2023-01-24 (×4): 300 mg via ORAL
  Filled 2023-01-23 (×5): qty 1

## 2023-01-23 MED ORDER — STERILE WATER FOR INJECTION IJ SOLN
INTRAMUSCULAR | Status: AC
  Filled 2023-01-23: qty 10

## 2023-01-23 MED ORDER — ZIPRASIDONE MESYLATE 20 MG IM SOLR
20.0000 mg | Freq: Once | INTRAMUSCULAR | Status: AC
  Administered 2023-01-23: 20 mg via INTRAMUSCULAR
  Filled 2023-01-23: qty 20

## 2023-01-23 MED ORDER — BENZTROPINE MESYLATE 0.5 MG PO TABS
0.5000 mg | ORAL_TABLET | Freq: Two times a day (BID) | ORAL | Status: DC
  Administered 2023-01-23 – 2023-01-24 (×3): 0.5 mg via ORAL
  Filled 2023-01-23 (×3): qty 1

## 2023-01-23 MED ORDER — STERILE WATER FOR INJECTION IJ SOLN
INTRAMUSCULAR | Status: AC
  Administered 2023-01-23: 1.2 mL
  Filled 2023-01-23: qty 10

## 2023-01-23 MED ORDER — VALBENAZINE TOSYLATE 40 MG PO CAPS
40.0000 mg | ORAL_CAPSULE | Freq: Every day | ORAL | Status: DC
  Administered 2023-01-23 – 2023-01-24 (×2): 40 mg via ORAL
  Filled 2023-01-23 (×2): qty 1
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
## 2023-01-23 NOTE — ED Notes (Signed)
Pt REFUSED VITAL SIGNS

## 2023-01-23 NOTE — ED Provider Notes (Signed)
[place] EMERGENCY DEPARTMENT AT [facility] Provider Note   CSN: [id number] Arrival date & time: 01/23/23  1142     History  Chief Complaint  Patient presents with   Psychiatric Evaluation    Vincent [name] is a 27 y.o. male.  HPI Patient presents under IVC and had been brought in by police to behavioral health urgent care.  Was reportedly yelling belligerent and threatening people.  Given his Abilify long-acting medication while he was there.  Now more calm.  States he was in jail briefly on Friday and they took his phone.  Reportedly also told police that he had a bomb in him that he was going to kill everyone with.  Patient more calm now.  States he will use marijuana but denies other drug use.   Past Medical History:  Diagnosis Date   Anxiety    Schizophrenia (HCC)     Home Medications Prior to Admission medications   Medication Sig Start Date End Date Taking? Authorizing Provider  ARIPiprazole ER (ABILIFY MAINTENA) 400 MG PRSY prefilled syringe Inject 400 mg into the muscle every 28 (twenty-eight) days. Patient taking differently: Inject 400 mg into the muscle every 28 (twenty-eight) days. 12/29/22  Yes [name] E, NP  benztropine (COGENTIN) 0.5 MG tablet Take 0.5 mg by mouth 2 (two) times daily.   Yes [provider]  gabapentin (NEURONTIN) 300 MG capsule Take 1 capsule (300 mg total) by mouth 3 (three) times daily. 12/29/22  Yes [name] E, NP  ibuprofen (ADVIL) 800 MG tablet Take 1 tablet (800 mg total) by mouth 3 (three) times daily. 01/06/23  Yes Arby Barrette, MD  amoxicillin-clavulanate (AUGMENTIN) 875-125 MG tablet Take 1 tablet by mouth every 12 (twelve) hours. 01/06/23   Arby Barrette, MD  valbenazine [facility]) 40 MG capsule Take 1 capsule (40 mg total) by mouth daily. 12/29/22   Shanna Cisco, NP      Allergies    Shrimp [shellfish allergy]    Review of Systems   Review of Systems  Physical  Exam Updated Vital Signs BP (!) 137/91 (BP Location: Left Arm)   Pulse 92   Temp 98.4 F (36.9 C) (Oral)   Resp 16   Ht 6' (1.829 m)   Wt 91 kg   SpO2 97%   BMI 27.21 kg/m  Physical Exam Vitals and nursing note reviewed.  Cardiovascular:     Rate and Rhythm: Regular rhythm.  Pulmonary:     Effort: No respiratory distress.  Musculoskeletal:        General: Tenderness present.     Cervical back: Neck supple.  Neurological:     Mental Status: He is alert. Mental status is at baseline.     ED Results / Procedures / Treatments   Labs (all labs ordered are listed, but only abnormal results are displayed) Labs Reviewed  COMPREHENSIVE METABOLIC PANEL - Abnormal; Notable for the following components:      Result Value   Creatinine, Ser 1.26 (*)    AST 87 (*)    ALT 46 (*)    All other components within normal limits  RAPID URINE DRUG SCREEN, HOSP PERFORMED - Abnormal; Notable for the following components:   Cocaine POSITIVE (*)    Amphetamines POSITIVE (*)    Tetrahydrocannabinol POSITIVE (*)    All other components within normal limits  CBC - Abnormal; Notable for the following components:   WBC 11.5 (*)    All other components within normal  limits  ETHANOL    EKG EKG Interpretation  Date/Time:  Monday Jan 23 2023 12:23:54 EDT Ventricular Rate:  84 PR Interval:  145 QRS Duration: 96 QT Interval:  362 QTC Calculation: 428 R Axis:   84 Text Interpretation: Sinus rhythm Consider right atrial enlargement Borderline ST elevation, anterolateral leads Confirmed by Benjiman Core [phone]) on 01/23/2023 1:06:33 PM  Radiology No results found.  Procedures Procedures    Medications Ordered in ED Medications  gabapentin (NEURONTIN) capsule 300 mg (has no administration in time range)  valbenazine (INGREZZA) capsule 40 mg (40 mg Oral Given 01/23/23 1305)  benztropine (COGENTIN) tablet 0.5 mg (0.5 mg Oral Given 01/23/23 1305)    ED Course/ Medical Decision Making/ A&P                              Medical Decision Making Amount and/or Complexity of Data Reviewed Labs: ordered.  Risk Prescription drug management.      Patient brought in under IVC.  Reportedly made threatening statements.  More calm now.  Will check basic blood work.  Patient is however presumed to be medically cleared.  Patient's blood work has returned.  Patient is medically cleared however is positive for cocaine and amphetamine also.  IVC paperwork has been done and I will do first exam.  Patient is medically cleared.  Patient now has become more aggressive.  Will give Geodon.       Final Clinical Impression(s) / ED Diagnoses Final diagnoses:  Schizoaffective disorder, bipolar type [facility])    Rx / DC Orders ED Discharge Orders     None         Benjiman Core, MD 01/23/23 1448

## 2023-01-23 NOTE — Progress Notes (Signed)
   01/23/23 1022  [facility] Triage Screening (Walk-ins at [facility] only)  How Did You Hear About [place]? Legal System  What Is the Reason for Your Visit/Call Today? Pt presents to [facility] escorted by [employer] due to increased agitation and manic behavior.Pt irate, agitated, screaming, stomping his feet. Pt reports he needs an injection but unable to tell what type of injection. Unable to assess fully due to presentation.  How Long Has This Been Causing You Problems?  (UTA)  Have You Recently Had Any Thoughts About Hurting Yourself?  (UTA)  Are You Planning to Commit Suicide/Harm Yourself At This time?  (UTA)  Have you Recently Had Thoughts About Hurting Someone Else?  (UTA)  Are You Planning To Harm Someone At This Time?  (UTA)  Are you currently experiencing any auditory, visual or other hallucinations?  (UTA)  Have You Used Any Alcohol or Drugs in the Past 24 Hours?  (UTA)  Do you have any current medical co-morbidities that require immediate attention?  (UTA)  What Do You Feel Would Help You the Most Today? Medication(s) (UTA)  If access to [facility] was not available, would you have sought care in the Emergency Department?  (UTA)  Determination of Need Emergent (2 hours)  Options For Referral ED Visit;Inpatient Hospitalization

## 2023-01-23 NOTE — ED Provider Notes (Signed)
Behavioral Health Urgent Care Medical Screening Exam  Patient Name: Vincent [name] MRN: [id number] Date of Evaluation: 01/23/23 Chief Complaint:   Diagnosis:  Final diagnoses:  Schizoaffective disorder, bipolar type (HCC)  GAD (generalized anxiety disorder)  Aggressive behavior    History of Present illness: Vincent [name] is a 27 y.o. male patient with a psychiatric history of substance induced psychotic disorder, Adjustment disorder, Schizoaffective disorder bipolar type, Substance induced mood disorder, and cannabis induced hallucinations presented to [facility] as a walk in  via [facility] with complaints that patient was found on the street cursing and yelling at passersby threatening to harm.  Officer also reports sweating profusely, pulling off shirt to throw in street.  Individual called the police.     Vincent [name] seen face to face by this provider, consulted with Dr. Nelly Rout; and chart reviewed on 01/23/23.  On evaluation Vincent [name] reports is sitting in assessment room in handcuffs yelling, screaming "I'm gonna fuck that [place] bitch up for taking my phone.  I want my damn shot.  That bitch trying to be all seductive sucking on dick and then taking my phone.  I'm gonna fuck that bitch up.  These police ain't shit."  This provider was able to get patient to calm down for a minute and attempted to redirect.  Patient has outpatient psychiatric services with [facility] and is scheduled to receive his Abilify Maintena injection on 01/26/2023.  States he told the police to bring him here to get his shot.   Officer states that when they approached patient in front of [facility] he asked to be brought to [facility] cause he trust the people there and that he wanted to get his shot.  Stated that he calmed down until he arrived at [facility]. Also attempted to have outpatient staff to speak with patient to redirect and calm down.   Vincent Cookey, DNP did  get patient to agree to take his LAI (Abilify Maintena) but right after receiving injection patient started yelling, screaming, threatening police and a [place] girl that he states "as soon as I leave I'm going to beat that [place] britches ass."     During evaluation Vincent [name] is up and down from chair.  He is alert/oriented x 4, uncooperative, inattentive, and responses were relevant to assessment questions at times but patient fixated on getting his phone back from someone that took it in the resource office at [facility], and wanting to beat up a [place] girl that he states is the one that took his phone and that he caught "giving head to some motherfucker in the office."   He spoke in a clear tone at increased volume (yelling, screaming, cursing) He denies suicidal/self-harm, psychosis, and paranoia but is endorsing that there is someone that he wants to hurt.  Objectively:  Patient appears to be paranoid, angry, aggressive and threatening to do body harm to another.  [employer] in the process of obtaining and involuntary commitment petition.  Will transfer patient to the ED for medical clearance.  Unable to admit to continuous assessment unit related to acuity of condition and possibility of disruption of milieu with aggression towards other patients on unit.    Flowsheet Row ED from 01/06/2023 in [facility] Emergency Department at [facility] Visit from 12/29/2022 in [facility] Office Visit from 06/09/2022 in [facility]  C-SSRS RISK CATEGORY No  Risk Error: Q7 should not be populated when Q6 is No No Risk       Psychiatric Specialty Exam  Presentation  General Appearance:Appropriate for Environment; Disheveled  Eye Contact:Good  Speech:Clear and Coherent; Normal Rate  Speech Volume:Increased  Handedness:Right   Mood and Affect  Mood: Angry  Affect: Congruent   Thought Process   Thought Processes: Coherent; Linear  Descriptions of Associations:Tangential  Orientation:Full (Time, Place and Person)  Thought Content:Tangential    Hallucinations:None  Ideas of Reference:Paranoia  Suicidal Thoughts:No  Homicidal Thoughts:No (But states he wants to hurt a [place] girl that took his phone at the [facility])   Sensorium  Memory: Immediate Fair; Recent Fair  Judgment: Fair  Insight: Fair; Present   Executive Functions  Concentration: Fair  Attention Span: Fair  Recall: [employer] of Knowledge: Fair  Language: Good   Psychomotor Activity  Psychomotor Activity: Normal   Assets  Assets: Communication Skills; Desire for Improvement; Physical Health   Sleep  Sleep: Fair  Number of hours: No data recorded  Physical Exam: Physical Exam Vitals and nursing note reviewed. Exam conducted with a chaperone present [facility] Police).  Constitutional:      General: He is not in acute distress.    Appearance: Normal appearance. He is not ill-appearing.  HENT:     Head: Normocephalic.  Eyes:     Conjunctiva/sclera: Conjunctivae normal.  Cardiovascular:     Rate and Rhythm: Normal rate.  Pulmonary:     Effort: Pulmonary effort is normal. No respiratory distress.  Musculoskeletal:        General: Normal range of motion.     Cervical back: Normal range of motion.  Skin:    General: Skin is warm and dry.  Neurological:     Mental Status: He is alert and oriented to person, place, and time.  Psychiatric:        Attention and Perception: Perception normal. He does not perceive auditory or visual hallucinations.        Mood and Affect: Mood is anxious. Affect is angry.        Speech: Speech is tangential.        Behavior: Behavior is agitated and aggressive.        Thought Content: Thought content is paranoid. Thought content does not include suicidal ideation.        Judgment: Judgment is impulsive.    Review of Systems   Constitutional:        No other complaints voiced   All other systems reviewed and are negative.  There were no vitals taken for this visit. There is no height or weight on file to calculate BMI.  Musculoskeletal: Strength & Muscle Tone: within normal limits Gait & Station: normal Patient leans: N/A   [facility] MSE Discharge Disposition for Follow up and Recommendations: Transfer to [facility] ED for medical clearance  Spoke to Dr. Rubin Payor via phone informed of patient condition and agreed to accepted transfer.  Sent a secure message to Dr. Rubin Payor and Julieanne Cotton, NP informing of transfer and IVC.     Abayomi Pattison, NP 01/23/2023, 11:38 AM

## 2023-01-23 NOTE — ED Notes (Signed)
Patient standing at the door smacking his hands like he is ready to punch and fight someone. "Ima fuck you up bitch." Patient ripping things out of the cabinet and throwing them around in the room. Patient yelling "fuck yall cracker and black bitch ass."

## 2023-01-24 ENCOUNTER — Other Ambulatory Visit: Payer: Self-pay

## 2023-01-24 ENCOUNTER — Inpatient Hospital Stay (HOSPITAL_COMMUNITY)
Admission: AD | Admit: 2023-01-24 | Discharge: 2023-02-02 | DRG: 885 | Disposition: A | Discharge status: Home / Self Care | Payer: Medicaid Other | Source: Intra-hospital | Attending: Psychiatry | Admitting: Psychiatry

## 2023-01-24 ENCOUNTER — Encounter (HOSPITAL_COMMUNITY): Payer: Self-pay | Admitting: Nurse Practitioner

## 2023-01-24 DIAGNOSIS — I10 Essential (primary) hypertension: Secondary | ICD-10-CM | POA: Diagnosis present

## 2023-01-24 DIAGNOSIS — F19929 Other psychoactive substance use, unspecified with intoxication, unspecified: Secondary | ICD-10-CM | POA: Diagnosis not present

## 2023-01-24 DIAGNOSIS — Z5900 Homelessness unspecified: Secondary | ICD-10-CM

## 2023-01-24 DIAGNOSIS — Z91148 Patient's other noncompliance with medication regimen for other reason: Secondary | ICD-10-CM | POA: Diagnosis not present

## 2023-01-24 DIAGNOSIS — G2401 Drug induced subacute dyskinesia: Secondary | ICD-10-CM | POA: Diagnosis present

## 2023-01-24 DIAGNOSIS — F17210 Nicotine dependence, cigarettes, uncomplicated: Secondary | ICD-10-CM | POA: Diagnosis present

## 2023-01-24 DIAGNOSIS — Z79899 Other long term (current) drug therapy: Secondary | ICD-10-CM | POA: Diagnosis not present

## 2023-01-24 DIAGNOSIS — Z5941 Food insecurity: Secondary | ICD-10-CM | POA: Diagnosis not present

## 2023-01-24 DIAGNOSIS — F250 Schizoaffective disorder, bipolar type: Secondary | ICD-10-CM

## 2023-01-24 DIAGNOSIS — G4700 Insomnia, unspecified: Secondary | ICD-10-CM | POA: Diagnosis present

## 2023-01-24 DIAGNOSIS — Z5982 Transportation insecurity: Secondary | ICD-10-CM

## 2023-01-24 DIAGNOSIS — F19950 Other psychoactive substance use, unspecified with psychoactive substance-induced psychotic disorder with delusions: Secondary | ICD-10-CM | POA: Diagnosis not present

## 2023-01-24 DIAGNOSIS — F411 Generalized anxiety disorder: Secondary | ICD-10-CM | POA: Diagnosis present

## 2023-01-24 DIAGNOSIS — F1220 Cannabis dependence, uncomplicated: Secondary | ICD-10-CM | POA: Diagnosis present

## 2023-01-24 DIAGNOSIS — F19129 Other psychoactive substance abuse with intoxication, unspecified: Secondary | ICD-10-CM | POA: Diagnosis present

## 2023-01-24 MED ORDER — ACETAMINOPHEN 325 MG PO TABS
650.0000 mg | ORAL_TABLET | Freq: Four times a day (QID) | ORAL | Status: DC | PRN
  Administered 2023-01-25 – 2023-01-28 (×2): 650 mg via ORAL
  Filled 2023-01-24 (×2): qty 2

## 2023-01-24 MED ORDER — HALOPERIDOL 5 MG PO TABS
5.0000 mg | ORAL_TABLET | Freq: Three times a day (TID) | ORAL | Status: DC | PRN
  Administered 2023-01-24 – 2023-01-26 (×4): 5 mg via ORAL
  Filled 2023-01-24 (×4): qty 1

## 2023-01-24 MED ORDER — ARIPIPRAZOLE 5 MG PO TABS
5.0000 mg | ORAL_TABLET | Freq: Every day | ORAL | Status: DC
  Administered 2023-01-24: 5 mg via ORAL
  Filled 2023-01-24: qty 1

## 2023-01-24 MED ORDER — LORAZEPAM 1 MG PO TABS: 1.0000 mg | ORAL_TABLET | Freq: Three times a day (TID) | ORAL | Status: DC | PRN

## 2023-01-24 MED ORDER — VALBENAZINE TOSYLATE 40 MG PO CAPS
40.0000 mg | ORAL_CAPSULE | Freq: Every day | ORAL | Status: DC
  Administered 2023-01-25 – 2023-02-02 (×9): 40 mg via ORAL
  Filled 2023-01-24 (×12): qty 1

## 2023-01-24 MED ORDER — LORAZEPAM 1 MG PO TABS
2.0000 mg | ORAL_TABLET | Freq: Once | ORAL | Status: AC
  Administered 2023-01-24: 2 mg via ORAL
  Filled 2023-01-24: qty 2

## 2023-01-24 MED ORDER — STERILE WATER FOR INJECTION IJ SOLN
INTRAMUSCULAR | Status: AC
  Administered 2023-01-24: 1.2 mL
  Filled 2023-01-24: qty 10

## 2023-01-24 MED ORDER — ARIPIPRAZOLE 5 MG PO TABS
5.0000 mg | ORAL_TABLET | Freq: Every day | ORAL | Status: DC
  Administered 2023-01-25: 5 mg via ORAL
  Filled 2023-01-24 (×4): qty 1

## 2023-01-24 MED ORDER — DIPHENHYDRAMINE HCL 25 MG PO CAPS
50.0000 mg | ORAL_CAPSULE | Freq: Three times a day (TID) | ORAL | Status: DC | PRN
  Administered 2023-01-24 – 2023-01-29 (×6): 50 mg via ORAL
  Filled 2023-01-24 (×6): qty 2

## 2023-01-24 MED ORDER — ZIPRASIDONE MESYLATE 20 MG IM SOLR
20.0000 mg | Freq: Once | INTRAMUSCULAR | Status: AC
  Administered 2023-01-24: 20 mg via INTRAMUSCULAR
  Filled 2023-01-24: qty 20

## 2023-01-24 MED ORDER — MAGNESIUM HYDROXIDE 400 MG/5ML PO SUSP: 30.0000 mL | Freq: Every day | ORAL | Status: DC | PRN

## 2023-01-24 MED ORDER — HALOPERIDOL LACTATE 5 MG/ML IJ SOLN
5.0000 mg | Freq: Three times a day (TID) | INTRAMUSCULAR | Status: DC | PRN
  Filled 2023-01-24: qty 1

## 2023-01-24 MED ORDER — BENZTROPINE MESYLATE 0.5 MG PO TABS
0.5000 mg | ORAL_TABLET | Freq: Two times a day (BID) | ORAL | Status: DC
  Administered 2023-01-24 – 2023-01-25 (×3): 0.5 mg via ORAL
  Filled 2023-01-24 (×8): qty 1

## 2023-01-24 MED ORDER — ALUM & MAG HYDROXIDE-SIMETH 200-200-20 MG/5ML PO SUSP: 30.0000 mL | ORAL | Status: DC | PRN

## 2023-01-24 MED ORDER — NICOTINE 14 MG/24HR TD PT24
14.0000 mg | MEDICATED_PATCH | Freq: Every day | TRANSDERMAL | Status: DC
  Administered 2023-01-25 – 2023-01-29 (×4): 14 mg via TRANSDERMAL
  Filled 2023-01-24 (×12): qty 1

## 2023-01-24 MED ORDER — DIPHENHYDRAMINE HCL 50 MG/ML IJ SOLN
50.0000 mg | Freq: Three times a day (TID) | INTRAMUSCULAR | Status: DC | PRN
  Administered 2023-01-26 – 2023-01-27 (×2): 50 mg via INTRAMUSCULAR
  Filled 2023-01-24 (×3): qty 1

## 2023-01-24 MED ORDER — LORAZEPAM 1 MG PO TABS
2.0000 mg | ORAL_TABLET | Freq: Three times a day (TID) | ORAL | Status: DC | PRN
  Administered 2023-01-24 – 2023-01-29 (×6): 2 mg via ORAL
  Filled 2023-01-24 (×6): qty 2

## 2023-01-24 MED ORDER — LORAZEPAM 2 MG/ML IJ SOLN
2.0000 mg | Freq: Three times a day (TID) | INTRAMUSCULAR | Status: DC | PRN
  Administered 2023-01-26 – 2023-01-27 (×2): 2 mg via INTRAMUSCULAR
  Filled 2023-01-24 (×3): qty 1

## 2023-01-24 MED ORDER — GABAPENTIN 300 MG PO CAPS
300.0000 mg | ORAL_CAPSULE | Freq: Three times a day (TID) | ORAL | Status: DC
  Administered 2023-01-25 (×2): 300 mg via ORAL
  Filled 2023-01-24 (×8): qty 1

## 2023-01-24 NOTE — ED Notes (Signed)
Patient medication compliant this afternoon.

## 2023-01-24 NOTE — Consult Note (Signed)
[facility] ED ASSESSMENT   Reason for Consult:  Psychiatry evaluation  Referring Physician:  ER Physician Patient Identification: Vincent [name] MRN:  [id number] ED Chief Complaint: Schizoaffective disorder, bipolar type [facility])  Diagnosis:  Principal Problem:   Schizoaffective disorder, bipolar type (HCC) Active Problems:   Cannabis use disorder, severe, dependence (HCC)   ED Assessment Time Calculation: Start Time: 1342 Stop Time: 1406 Total Time in Minutes (Assessment Completion): 24   Subjective:   Vincent [name] is a 27 y.o. male patient admitted with hx of Schizoaffective Disorder, Bipolar type/Schizophrenia, Cannabis dependence, Substance induced mood disorder and Generalized anxiety disorder was brought in to the ER from [facility] under IVC for aggression.  Patient was found in the street yelling, cursing at passersby threatening to harm them per Triage note.  Patient was given his Abilify Maintena injection at his request since he was due to receive it on the 9th of May.  After receiving the injection patient started yelling again, screaming and threatening Police and threatened to beat the [place] bitch.  HPI:  Patient was seen by this provider this afternoon.  He was calm and cooperative but full of anger and resentment towards his mother, the [place] staff at [facility].  Patient, since arriving at the ER yesterday has had two doses of Geodon on different occasions for severe agitation, yelling, screaming and threatening staff.  His last Geodon was early this morning.  Patient received oral dose of Ativan 2 mg an hour ago.  He participated in the interview.  Patient agrees that he need to remain stable and states that was the reason why he went to [facility] for his injection.  He reports that he has been diagnosed with Schizophrenia since age [age] but started taking Medications at age 27.  He admits that he does not get along with his mother and is currently living at [facility].  He also had good  words for [facility] and plans to go back there after hospitalization.  Patient admits using Marijuana daily and does not see the reason to stop stating that "weed opens up my brain and my mind so that I can see and read what is going o around me"  Provider advised to stop using Marijuana and focus on prescribed medications.  Patient reports poor sleep and eats one or two meals a day.  He denied SI/HI/AVH.  No mention of paranoia at this time.  His anger is towards a staff at the [facility] that refused to give back his phone and head gear. We will be seeking inpatient Psychiatry hospitalization for safety and stabilization.  He is a danger to others and himself as a result of excessive anger and poor mood control.  We will fax out records to other facilities if we have no bed at [facility].  Meanwhile we will offer 5 mg Abilify orally daily, resume Gabapentin 300 mg three times a day and Cogentin 0.5 mg twice a day  Ingrezza 40 mg daily.  Past Psychiatric History: x of Schizoaffective Disorder, Bipolar type/Schizophrenia, Cannabis dependence, Substance induced mood disorder and Generalized anxiety disorder.  Patient receives outpatient Mental healthcare at [facility].  He is on Abilify Maintena Monthly injection and last dose was yesterday May 6th 2024.  [facility].  Risk to Self or Others: Is the patient at risk to self? No Has the patient been a risk to self in the past 6 months? No Has the patient been a risk to  self within the distant past? No Is the patient a risk to others? No Has the patient been a risk to others in the past 6 months? No Has the patient been a risk to others within the distant past? No  [place] Scale:  Flowsheet Row ED from 01/23/2023 in [facility] Emergency Department at [facility] ED from 01/06/2023 in [facility] Emergency Department at [facility] Office Visit from 12/29/2022 in [facility]  C-SSRS RISK CATEGORY  No Risk No Risk Error: Q7 should not be populated when Q6 is No       AIMS:  , , ,  ,   ASAM:    Substance Abuse:     Past Medical History:  Past Medical History:  Diagnosis Date   Anxiety    Schizophrenia (HCC)     Past Surgical History:  Procedure Laterality Date   MANDIBLE SURGERY     WRIST SURGERY     laceration by glass that pt sts was closed with surgery   Family History: History reviewed. No pertinent family history. Family Psychiatric  History: Denies Social History:  Social History   Substance and Sexual Activity  Alcohol Use Yes   Alcohol/week: 1.0 standard drink of alcohol   Types: 1 Shots of liquor per week   Comment: occasionally/ maybe once a month     Social History   Substance and Sexual Activity  Drug Use Yes   Types: Marijuana   Comment: 1-2 zx per month    Social History   Socioeconomic History   Marital status: Single    Spouse name: Not on file   Number of children: Not on file   Years of education: Not on file   Highest education level: Not on file  Occupational History   Not on file  Tobacco Use   Smoking status: Every Day    Packs/day: 1.00    Years: 2.00    Additional pack years: 0.00    Total pack years: 2.00    Types: Cigarettes   Smokeless tobacco: Never  Vaping Use   Vaping Use: Never used  Substance and Sexual Activity   Alcohol use: Yes    Alcohol/week: 1.0 standard drink of alcohol    Types: 1 Shots of liquor per week    Comment: occasionally/ maybe once a month   Drug use: Yes    Types: Marijuana    Comment: 1-2 zx per month   Sexual activity: Yes    Birth control/protection: None  Other Topics Concern   Not on file  Social History Narrative   Not on file   Social Determinants of Health   Financial Resource Strain: Not on file  Food Insecurity: Not on file  Transportation Needs: Not on file  Physical Activity: Not on file  Stress: Not on file  Social Connections: Not on file   Additional Social  History:    Allergies:   Allergies  Allergen Reactions   Shrimp [Shellfish Allergy] Anaphylaxis    Labs:  Results for orders placed or performed during the hospital encounter of 01/23/23 (from the past 48 hour(s))  Comprehensive metabolic panel     Status: Abnormal   Collection Time: 01/23/23 12:01 PM  Result Value Ref Range   Sodium 142 135 - 145 mmol/L   Potassium 3.7 3.5 - 5.1 mmol/L   Chloride 107 98 - 111 mmol/L   CO2 28 22 - 32 mmol/L   Glucose, Bld 77 70 - 99 mg/dL  Comment: Glucose reference range applies only to samples taken after fasting for at least 8 hours.   BUN 17 6 - 20 mg/dL   Creatinine, Ser 8.29 (H) 0.61 - 1.24 mg/dL   Calcium 9.2 8.9 - 56.2 mg/dL   Total Protein 7.9 6.5 - 8.1 g/dL   Albumin 4.8 3.5 - 5.0 g/dL   AST 87 (H) 15 - 41 U/L   ALT 46 (H) 0 - 44 U/L   Alkaline Phosphatase 82 38 - 126 U/L   Total Bilirubin 1.1 0.3 - 1.2 mg/dL   GFR, Estimated >13 >08 mL/min    Comment: (NOTE) Calculated using the CKD-EPI Creatinine Equation (2021)    Anion gap 7 5 - 15    Comment: Performed at [facility], [address], [place], [place] [address]  Ethanol     Status: None   Collection Time: 01/23/23 12:01 PM  Result Value Ref Range   Alcohol, Ethyl (B) <10 <10 mg/dL    Comment: (NOTE) Lowest detectable limit for serum alcohol is 10 mg/dL.  For medical purposes only. Performed at [facility], [address], [place], [place] [address]   CBC     Status: Abnormal   Collection Time: 01/23/23 12:01 PM  Result Value Ref Range   WBC 11.5 (H) 4.0 - 10.5 K/uL   RBC 5.12 4.22 - 5.81 MIL/uL   Hemoglobin 15.4 13.0 - 17.0 g/dL   HCT 52.8 41.3 - 24.4 %   MCV 89.6 80.0 - 100.0 fL   MCH 30.1 26.0 - 34.0 pg   MCHC 33.6 30.0 - 36.0 g/dL   RDW 01.0 27.2 - 53.6 %   Platelets 258 150 - 400 K/uL   nRBC 0.0 0.0 - 0.2 %    Comment: Performed at [facility], [address] [place], [place] [address]  Urine  rapid drug screen (hosp performed)     Status: Abnormal   Collection Time: 01/23/23 12:23 PM  Result Value Ref Range   Opiates NONE DETECTED NONE DETECTED   Cocaine POSITIVE (A) NONE DETECTED   Benzodiazepines NONE DETECTED NONE DETECTED   Amphetamines POSITIVE (A) NONE DETECTED   Tetrahydrocannabinol POSITIVE (A) NONE DETECTED   Barbiturates NONE DETECTED NONE DETECTED    Comment: (NOTE) DRUG SCREEN FOR MEDICAL PURPOSES ONLY.  IF CONFIRMATION IS NEEDED FOR ANY PURPOSE, NOTIFY LAB WITHIN 5 DAYS.  LOWEST DETECTABLE LIMITS FOR URINE DRUG SCREEN Drug Class                     Cutoff (ng/mL) Amphetamine and metabolites    1000 Barbiturate and metabolites    200 Benzodiazepine                 200 Opiates and metabolites        300 Cocaine and metabolites        300 THC                            50 Performed at [facility], [address], [place], [place] [address]     Current Facility-Administered Medications  Medication Dose Route Frequency Provider Last Rate Last Admin   ARIPiprazole (ABILIFY) tablet 5 mg  5 mg Oral Daily Rehmat Murtagh C, NP       ARIPiprazole ER (ABILIFY MAINTENA) 400 MG prefilled syringe 400 mg  400 mg Intramuscular Q30 days Zena Amos, MD   400  mg at 12/29/22 1017   benztropine (COGENTIN) tablet 0.5 mg  0.5 mg Oral BID Benjiman Core, MD   0.5 mg at 01/24/23 1144   gabapentin (NEURONTIN) capsule 300 mg  300 mg Oral TID Benjiman Core, MD   300 mg at 01/24/23 1144   LORazepam (ATIVAN) tablet 1 mg  1 mg Oral Q8H PRN Earney Navy, NP       valbenazine (INGREZZA) capsule 40 mg  40 mg Oral Daily Benjiman Core, MD   40 mg at 01/24/23 1143   Current Outpatient Medications  Medication Sig Dispense Refill   ARIPiprazole ER (ABILIFY MAINTENA) 400 MG PRSY prefilled syringe Inject 400 mg into the muscle every 28 (twenty-eight) days. (Patient taking differently: Inject 400 mg into the muscle every 28 (twenty-eight) days.) 1  each 10   benztropine (COGENTIN) 0.5 MG tablet Take 0.5 mg by mouth 2 (two) times daily.     gabapentin (NEURONTIN) 300 MG capsule Take 1 capsule (300 mg total) by mouth 3 (three) times daily. 90 capsule 3   ibuprofen (ADVIL) 800 MG tablet Take 1 tablet (800 mg total) by mouth 3 (three) times daily. 21 tablet 0   valbenazine (INGREZZA) 40 MG capsule Take 1 capsule (40 mg total) by mouth daily. 30 capsule 3   amoxicillin-clavulanate (AUGMENTIN) 875-125 MG tablet Take 1 tablet by mouth every 12 (twelve) hours. (Patient not taking: Reported on 01/23/2023) 14 tablet 0    Musculoskeletal: Strength & Muscle Tone: within normal limits Gait & Station: normal Patient leans: Front   Psychiatric Specialty Exam: Presentation  General Appearance:  Casual; Neat  Eye Contact: Good  Speech: Clear and Coherent; Pressured  Speech Volume: Increased  Handedness: Right   Mood and Affect  Mood: Angry; Irritable; Labile  Affect: Congruent; Labile   Thought Process  Thought Processes: Coherent; Goal Directed; Linear  Descriptions of Associations:Intact  Orientation:Full (Time, Place and Person)  Thought Content:Logical  History of Schizophrenia/Schizoaffective disorder:No data recorded Duration of Psychotic Symptoms:No data recorded Hallucinations:Hallucinations: None  Ideas of Reference:None  Suicidal Thoughts:Suicidal Thoughts: No  Homicidal Thoughts:Homicidal Thoughts: No   Sensorium  Memory: Immediate Good; Recent Good; Remote Good  Judgment: Intact  Insight: Present   Executive Functions  Concentration: Good  Attention Span: Good  Recall: Good  Fund of Knowledge: Good  Language: Good   Psychomotor Activity  Psychomotor Activity: Psychomotor Activity: Normal   Assets  Assets: Communication Skills; Desire for Improvement; Physical Health    Sleep  Sleep: Sleep: Fair   Physical Exam: Physical Exam Vitals and nursing note reviewed.   Constitutional:      Appearance: Normal appearance.  HENT:     Head: Normocephalic and atraumatic.     Nose: Nose normal.  Cardiovascular:     Rate and Rhythm: Normal rate and regular rhythm.  Pulmonary:     Effort: Pulmonary effort is normal.  Musculoskeletal:        General: Normal range of motion.     Cervical back: Normal range of motion.  Skin:    General: Skin is warm and dry.  Neurological:     Mental Status: He is alert and oriented to person, place, and time.  Psychiatric:        Attention and Perception: Attention and perception normal.        Mood and Affect: Mood is anxious. Affect is labile and angry.        Speech: Speech is rapid and pressured.        Behavior:  Behavior is agitated and hyperactive. Behavior is cooperative.        Thought Content: Thought content normal.        Cognition and Memory: Cognition and memory normal.        Judgment: Judgment is impulsive.    Review of Systems  Constitutional: Negative.   HENT: Negative.    Eyes: Negative.   Respiratory: Negative.    Cardiovascular: Negative.   Gastrointestinal: Negative.   Genitourinary: Negative.   Musculoskeletal: Negative.   Skin: Negative.   Neurological: Negative.   Endo/Heme/Allergies: Negative.   Psychiatric/Behavioral:  Positive for substance abuse. The patient has insomnia.    Blood pressure (!) 148/85, pulse 80, temperature 99.1 F (37.3 C), temperature source Oral, resp. rate 18, height 6' (1.829 m), weight 91 kg, SpO2 99 %. Body mass index is 27.21 kg/m.  Medical Decision Making: Patient denies SI/HI/AVH however he remains aggressive, angry and agitated needing PRN medications.  He will benefit from few days in  the inpatient mental health unit.  We will seek bed placement while we resume home medications pending available bed.  Problem 1: Schizoaffective disorder, Bipolar type  Problem 2: Cannabis use disorder, Severe dependence Disposition:  admit, seek bed  placement  Earney Navy, NP-PMHNP-BC 01/24/2023 2:16 PM

## 2023-01-24 NOTE — Tx Team (Signed)
Initial Treatment Plan 01/24/2023 10:19 PM [name] WUJ:[id number]    PATIENT STRESSORS: Marital or family conflict   Medication change or noncompliance   Substance abuse     PATIENT STRENGTHS: General fund of knowledge  Motivation for treatment/growth  Supportive family/friends    PATIENT IDENTIFIED PROBLEMS: psychosis  "Anger"                   DISCHARGE CRITERIA:  Improved stabilization in mood, thinking, and/or behavior Verbal commitment to aftercare and medication compliance  PRELIMINARY DISCHARGE PLAN: Attend PHP/IOP Outpatient therapy  PATIENT/FAMILY INVOLVEMENT: This treatment plan has been presented to and reviewed with the patient, Vincent [name].  The patient and family have been given the opportunity to ask questions and make suggestions.  Delos Haring, RN 01/24/2023, 10:19 PM

## 2023-01-24 NOTE — ED Notes (Signed)
[facility] contacted to transport pt to [facility]. 

## 2023-01-24 NOTE — ED Notes (Addendum)
Report given to Micheal at [facility]. [employer] to transport. VS updated and paperwork to be given to [employer] with IVC paperwork

## 2023-01-24 NOTE — Progress Notes (Signed)
Pt was accepted to [facility] TODAY 01/24/2023, pending repeat VS and IVC paperwork faxed to [phone]. Bed assignment: 503-1  Pt meets inpatient criteria per Dahlia Byes, NP  Attending Physician will be Phineas Inches, MD  Report can be called to: - Adult unit: [phone]  Pt can arrive after night shift; Night Shift AC to coordinate arrival time  Care Team Notified: [facility] Rona Ravens, RN, Lum Babe, RN, and Dahlia Byes, NP  [place], [place]  01/24/2023 2:29 PM

## 2023-01-24 NOTE — Progress Notes (Addendum)
Patient ID: Vincent [name], male   DOB: 1996-04-01, 27 y.o.   MRN: [id number]  Admission Note:  27 yr male who presents IVC in no acute distress for the treatment of Psychosis and bizarre behavior. Pt appears flat and depressed. Pt was calm and cooperative with admission process. Pt denied SI/ HI/ AVH / pain at this time.  Per Assessment: HPI Patient presents under IVC and had been brought in by police to behavioral health urgent care.  Was reportedly yelling belligerent and threatening people.  Given his Abilify long-acting medication while he was there.  Now more calm.  States he was in jail briefly on Friday and they took his phone.  Reportedly also told police that he had a bomb in him that he was going to kill everyone with.  Patient more calm now.  States he will use marijuana but denies other drug use.  Substance induced mood disorder and Generalized anxiety disorder was brought in to the ER from [facility] under IVC for aggression. Patient was found in the street yelling, cursing at passersby threatening to harm them per Triage note. Patient was given his Abilify Maintena injection at his request since he was due to receive it on the 9th of May. After receiving the injection patient started yelling again, screaming and threatening Police and threatened to beat the [place] bitch.  Patient agrees that he need to remain stable and states that was the reason why he went to [facility] for his injection. He reports that he has been diagnosed with Schizophrenia since age [age] but started taking Medications at age 49. He admits that he does not get along with his mother and is currently living at [facility]. He also had good words for [facility] and plans to go back there after hospitalization. Patient admits using Marijuana daily and does not see the reason to stop stating that "weed opens up my brain and my mind so that I can see and read what is going o around me" Provider advised to stop using Marijuana and focus on  prescribed medications. Patient reports poor sleep and eats one or two meals a day. He denied SI/HI/AVH. No mention of paranoia at this time. His anger is towards a staff at the [facility] that refused to give back his phone and head gear.   A: Skin was assessed and found to be clear of any abnormal marks apart from healed scar on L-hand, healed bite mark on back, tattoos bilateral arms. PT searched and no contraband found, POC and unit policies explained and understanding verbalized. Consents obtained. Food and fluids offered, and  accepted.   R: Pt had no additional questions or concerns.

## 2023-01-24 NOTE — ED Notes (Signed)
Pt spoke with his mother on the phone and was argumentative with her and yelling at her and staff during the phone call.  I reminded him he only had a 5 min call and he cursed and then put the phone back thru the window and told me to talk to his mom.  I spoke with her for a minute and she apologized for his actions and said she would check up on him later.  A few min later the pt came back near the window and stated he was not going to live with her.

## 2023-01-24 NOTE — Progress Notes (Signed)
Per [facility] ED nurse Lum Babe, RN updated care team that pt will transfer to [facility] after 7PM shift change. This update was communicated at 6:33pm.   Pt was accepted to [facility] TODAY 01/24/2023,Bed assignment: 503-1   Pt meets inpatient criteria per Dahlia Byes, NP   Attending Physician will be Phineas Inches, MD   Report can be called to: - Adult unit: [phone]   Pt can arrive after night shift; nursing staff to coordinate with nursing team.  Care Team notified: [facility] Rona Ravens, RN, CSW Disposition Cathie Beams, LCSWA, Earney Navy, NP-PMHNP-BC, Lum Babe, RN, Alvester Chou, MD   Maryjean Ka, MSW, [facility] 01/24/2023 7:50 PM

## 2023-01-24 NOTE — ED Notes (Signed)
Pt left ambulatory from [facility] in custody of [employer] to be transported to [facility].

## 2023-01-24 NOTE — ED Notes (Signed)
Pt is using vulgar language towards staff. Sexual comments made. Pt cooperative for vital signs. Agitated states he just wants to get to [facility]

## 2023-01-24 NOTE — ED Notes (Signed)
Patient labile.  Restless.  Hit the glass. Threatening staff at times.  Patient disorganized.   Yelling at times. Patient medication complaint.

## 2023-01-24 NOTE — ED Notes (Signed)
Pt pacing around hallways, talking angrily and cussing intermittently toward staff, took shirt off. Pt asked for morning gabapentin but when given, spit it back into medicine cup and threw it in the trash. Prn medication requested from MD, ordered and given without incident with security at bedside. Pt asking if it comes in a pill form so he doesn't have to keep getting shots.

## 2023-01-25 ENCOUNTER — Encounter (HOSPITAL_COMMUNITY): Payer: Self-pay | Admitting: Nurse Practitioner

## 2023-01-25 ENCOUNTER — Encounter (HOSPITAL_COMMUNITY): Payer: Self-pay

## 2023-01-25 DIAGNOSIS — F19950 Other psychoactive substance use, unspecified with psychoactive substance-induced psychotic disorder with delusions: Secondary | ICD-10-CM

## 2023-01-25 DIAGNOSIS — F19929 Other psychoactive substance use, unspecified with intoxication, unspecified: Secondary | ICD-10-CM

## 2023-01-25 MED ORDER — HALOPERIDOL 5 MG PO TABS: 5.0000 mg | ORAL_TABLET | Freq: Once | ORAL | Status: AC

## 2023-01-25 MED ORDER — LORAZEPAM 2 MG/ML IJ SOLN
2.0000 mg | Freq: Once | INTRAMUSCULAR | Status: AC
  Administered 2023-01-25: 2 mg via INTRAMUSCULAR

## 2023-01-25 MED ORDER — TRAZODONE HCL 50 MG PO TABS
50.0000 mg | ORAL_TABLET | Freq: Every evening | ORAL | Status: DC | PRN
  Administered 2023-01-25: 50 mg via ORAL
  Filled 2023-01-25 (×3): qty 1

## 2023-01-25 MED ORDER — ARIPIPRAZOLE ER 400 MG IM SRER: 400.0000 mg | Freq: Once | INTRAMUSCULAR | Status: DC

## 2023-01-25 MED ORDER — OLANZAPINE 10 MG PO TBDP
10.0000 mg | ORAL_TABLET | Freq: Two times a day (BID) | ORAL | Status: DC
  Administered 2023-01-25: 10 mg via ORAL
  Filled 2023-01-25 (×4): qty 1

## 2023-01-25 MED ORDER — HALOPERIDOL LACTATE 5 MG/ML IJ SOLN
5.0000 mg | Freq: Once | INTRAMUSCULAR | Status: AC
  Administered 2023-01-25: 5 mg via INTRAMUSCULAR

## 2023-01-25 MED ORDER — GABAPENTIN 300 MG PO CAPS
600.0000 mg | ORAL_CAPSULE | Freq: Three times a day (TID) | ORAL | Status: DC
  Administered 2023-01-25 – 2023-02-02 (×24): 600 mg via ORAL
  Filled 2023-01-25 (×30): qty 2

## 2023-01-25 MED ORDER — DIPHENHYDRAMINE HCL 50 MG PO CAPS: 50.0000 mg | ORAL_CAPSULE | Freq: Once | ORAL | Status: AC

## 2023-01-25 MED ORDER — DIPHENHYDRAMINE HCL 50 MG/ML IJ SOLN
50.0000 mg | Freq: Once | INTRAMUSCULAR | Status: AC
  Administered 2023-01-25: 50 mg via INTRAMUSCULAR

## 2023-01-25 MED ORDER — LORAZEPAM 1 MG PO TABS: 2.0000 mg | ORAL_TABLET | Freq: Once | ORAL | Status: AC

## 2023-01-25 MED ORDER — TRAZODONE HCL 150 MG PO TABS
150.0000 mg | ORAL_TABLET | ORAL | Status: AC
  Administered 2023-01-25: 150 mg via ORAL
  Filled 2023-01-25: qty 1

## 2023-01-25 NOTE — [facility] Suicide Risk Assessment (Signed)
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  Continued Clinical Symptoms:  Alcohol Use Disorder Identification Test Final Score (AUDIT): 1 The "Alcohol Use Disorders Identification Test", Guidelines for Use in Primary Care, Second Edition.  World Science writer [facility]). Score between 0-7:  no or low risk or alcohol related problems. Score between 8-15:  moderate risk of alcohol related problems. Score between 16-19:  high risk of alcohol related problems. Score 20 or above:  warrants further diagnostic evaluation for alcohol dependence and treatment.   CLINICAL FACTORS:   Currently Psychotic   Musculoskeletal: Strength & Muscle Tone: within normal limits Gait & Station: normal Patient leans: N/A  Psychiatric Specialty Exam:  Presentation  General Appearance:  Casual; Neat  Eye Contact: Good  Speech: Clear and Coherent; Pressured  Speech Volume: Increased  Handedness: Right   Mood and Affect  Mood: Angry; Irritable; Labile  Affect: Congruent; Labile   Thought Process  Thought Processes: Coherent; Goal Directed; Linear  Descriptions of Associations:Intact  Orientation:Full (Time, Place and Person)  Thought Content:Logical  History of Schizophrenia/Schizoaffective disorder:No data recorded Duration of Psychotic Symptoms:No data recorded Hallucinations:Hallucinations: None  Ideas of Reference:None  Suicidal Thoughts:Suicidal Thoughts: No  Homicidal Thoughts:Homicidal Thoughts:  No   Sensorium  Memory: Immediate Good; Recent Good; Remote Good  Judgment: Intact  Insight: Present   Executive Functions  Concentration: Good  Attention Span: Good  Recall: Good  Fund of Knowledge: Good  Language: Good   Psychomotor Activity  Psychomotor Activity: Psychomotor Activity: Normal   Assets  Assets: Communication Skills; Desire for Improvement; Physical Health   Sleep  Sleep: Sleep: Fair    Physical Exam: Physical Exam ROS Blood pressure (!) 140/80, pulse 60, temperature 98.3 F (36.8 C), temperature source Oral, resp. rate 16, height 6' (1.829 m), weight 86.2 kg, SpO2 99 %. Body mass index is 25.77 kg/m.   COGNITIVE FEATURES THAT CONTRIBUTE TO RISK:  Closed-mindedness and Polarized thinking    SUICIDE RISK:   Moderate:  Frequent suicidal ideation with limited intensity, and duration, some specificity in terms of plans, no associated intent, good self-control, limited dysphoria/symptomatology, some risk factors present, and identifiable protective factors, including available and accessible social support.  PLAN OF CARE:  Start zyprexa 10 mg po bid - seclusion for now due to aggression  I certify that inpatient services furnished can reasonably be expected to improve the patient's condition.   Timmie Foerster, MD 01/25/2023, 12:49 PM

## 2023-01-25 NOTE — Progress Notes (Signed)
Pt getting agitated about having to stay in his room before 6 am, pt given PRN agitation protocol per [facility] without incident. Pt stated the Gabapentin works good for him, pt educated that it is scheduled for him to take  at 8, 12 and 5 . Pt stated he would like to take the last dose at night. Pt informed to ask the doctor to change the times because if he takes it that way he wil be without the medication for 15 hrs with the last dose at 5 pm and the next dose at 8 am the next day.

## 2023-01-25 NOTE — BH Assessment (Signed)
Pt agitated and labile on unit. Pt yelling and cursing at staff through the unit door window. MD notified. Ativan 2 mg given per MD order.

## 2023-01-25 NOTE — H&P (Signed)
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    Plan  Will continue with IVC status Will discontinue Abilify 5 mg and start him on Zyprexa 10 twice daily for 4 to 5 days Will give him Abilify Maintena 400 mg IM tomorrow as it is due Provide with individual group and milieu therapy as much as tolerated Agitation protocol when needed Connect with outpatient team and his mother        History of Present Illness: Patient is a 27 year old African-American male who presented to the hospital escorted by police department with increased agitation and "manic behavior" patient reportedly was irate and agitated screaming stomping his feet, he talked about needing an injection reportedly he was on Abilify Maintena 400 mg IM but has not received it recently.,  Patient reportedly was found on the street cursing and yelling at passerby stating to them he was pulling off the shirt to throw in the street patient reportedly made significant comments such as "I am going to fuck that [place] bitch for taking my phone, patient continues to be extremely agitated and was transferred to the inpatient floor upon arrival he continued to be very bizarre irritable aggressive pacing in the hallways sometimes rambling and  very difficult to discern what he was saying.  Patient received initially 1 mg of Ativan and several verbal redirection but he did not respond well to this and continue to be very aggressive, patient received Haldol 10 mg and Benadryl 25 mg after 30 minutes of this and continued to escalate where seclusion was needed patient tried to elope once.  Continues to be very bizarre according to the mother patient has been homeless for some time has not been compliant with medication he was supposed to take the injection possibly tomorrow.  And has been using cocaine and methamphetamine urine drug screen is positive for cannabis, cocaine and methamphetamines.  Associated Signs/Symptoms: Depression Symptoms:  psychomotor agitation, (Hypo) Manic Symptoms:  Delusions,   Psychotic Symptoms:  Delusions, Paranoia, PTSD Symptoms: Negative Total Time spent with patient: 1 hour  Past Psychiatric History:  Patient has a diagnosis of substance-induced psychotic disorder sometimes schizophrenia and schizoaffective disorder have been also entertained he follows up in outpatient and receives Abilify Maintena 400 mg IM.  Is the patient at risk to self? Yes.    Has the patient been a risk to self in the past 6 months? Yes.    Has the patient been a risk to self within the distant past? Yes.    Is the patient a risk to others? Yes.    Has the patient been a risk to others in the past 6 months? Yes.    Has the patient been a risk to others within the distant past? Yes.     [place] Scale:  Flowsheet Row Ad[facility] (Current) [facility] from 01/23/2023 in [facility] Department at [facility] ED from 01/06/2023 in [facility] Emergency Department at [facility]  C-SSRS RISK CATEGORY No Risk No Risk No Risk        Prior Inpatient Therapy: Yes.   If yes, describe patient has been admitted here several times Prior Outpatient Therapy: Yes.    If yes, describe has followed up in outpatient sometimes and gets Abilify Maintena 400 mg IM  Alcohol Screening: 1. How often do you have a drink containing alcohol?: Never 2. How many drinks containing alcohol do you have on a typical day when you are drinking?: 1 or 2 3. How often do you have six or more drinks on one occasion?: Less than monthly AUDIT-C Score: 1 4. How often during the last year have you found that you were not able to stop drinking once you had started?: Never 5. How often during the last year have you failed to do what was normally expected from you because of drinking?: Never 6. How often during the last year have you needed a first drink in the morning to get yourself going after a heavy drinking session?: Never 7. How often during the last year have you had a feeling of guilt of remorse after drinking?: Never 8. How often during the last year have you been unable to remember what happened the night before because you had been drinking?: Never 9. Have you or someone else been injured as a result of your drinking?: No 10. Has a relative or friend or a doctor or another health worker been concerned about your drinking or suggested you cut down?: No Alcohol Use Disorder Identification Test Final Score (AUDIT): 1 Substance Abuse History in the last 12 months:  Yes.   Consequences of Substance Abuse: Legal Consequences:  Has been in trouble with the police many times Previous Psychotropic Medications: Yes  Psychological Evaluations: Yes  Past Medical History:  Past Medical History:  Diagnosis Date   Anxiety    Schizophrenia (HCC)     Past Surgical History:  Procedure Laterality Date   MANDIBLE SURGERY     WRIST SURGERY     laceration by glass that pt sts was closed with surgery   Family History: History reviewed. No pertinent family history. Family Psychiatric  History: No history of suicide attempts Tobacco Screening:   Social History   Tobacco Use  Smoking  Status Every Day   Packs/day: 1.00   Years: 2.00   Additional pack years: 0.00   Total pack years: 2.00   Types: Cigarettes  Smokeless Tobacco Never    BH Tobacco Counseling     Are you interested in Tobacco Cessation Medications?  Yes, implement Nicotene Replacement Protocol Counseled patient on smoking cessation:  Refused/Declined practical counseling Reason Tobacco Screening Not Completed: No value filed.       Social History:  Social History   Substance and Sexual Activity  Alcohol Use Yes   Alcohol/week: 1.0 standard drink of alcohol   Types: 1 Shots of liquor per week   Comment: occasionally/ maybe once a month     Social History   Substance and Sexual Activity  Drug Use Yes   Types: Marijuana, Amphetamines, Cocaine   Comment: 1-2 zx per month    Additional Social History:                           Allergies:   Allergies  Allergen Reactions   Shrimp [Shellfish Allergy] Anaphylaxis   Lab Results: No results found for this or any previous visit (from the past 48  hour(s)).  Blood Alcohol level:  Lab Results  Component Value Date   ETH <10 01/23/2023   ETH <5 10/17/2014    Metabolic Disorder Labs:  Lab Results  Component Value Date   HGBA1C 5.7 (H) 08/04/2022   MPG 114 10/21/2014   Lab Results  Component Value Date   PROLACTIN 0.8 (L) 08/04/2022   Lab Results  Component Value Date   CHOL 138 08/04/2022   TRIG 70 08/04/2022   HDL 50 08/04/2022   CHOLHDL 2.8 08/04/2022   VLDL 28 10/21/2014   LDLCALC 74 08/04/2022   LDLCALC 63 10/21/2014    Current Medications: Current Facility-Administered Medications  Medication Dose Route Frequency Provider Last Rate Last Admin   acetaminophen (TYLENOL) tablet 650 mg  650 mg Oral Q6H PRN Dahlia Byes C, NP   650 mg at 01/25/23 0035   alum & mag hydroxide-simeth (MAALOX/MYLANTA) 200-200-20 MG/5ML suspension 30 mL  30 mL Oral Q4H PRN Earney Navy, NP       [START ON 01/26/2023]  ARIPiprazole ER (ABILIFY MAINTENA) injection 400 mg  400 mg Intramuscular Once Tim Wilhide R, MD       benztropine (COGENTIN) tablet 0.5 mg  0.5 mg Oral BID Dahlia Byes C, NP   0.5 mg at 01/25/23 0734   diphenhydrAMINE (BENADRYL) capsule 50 mg  50 mg Oral TID PRN Dahlia Byes C, NP   50 mg at 01/25/23 0522   Or   diphenhydrAMINE (BENADRYL) injection 50 mg  50 mg Intramuscular TID PRN Dahlia Byes C, NP       gabapentin (NEURONTIN) capsule 600 mg  600 mg Oral TID Elaynah Virginia R, MD       haloperidol (HALDOL) tablet 5 mg  5 mg Oral TID PRN Dahlia Byes C, NP   5 mg at 01/25/23 0522   Or   haloperidol lactate (HALDOL) injection 5 mg  5 mg Intramuscular TID PRN Earney Navy, NP       LORazepam (ATIVAN) tablet 2 mg  2 mg Oral TID PRN Dahlia Byes C, NP   2 mg at 01/25/23 1610   Or   LORazepam (ATIVAN) injection 2 mg  2 mg Intramuscular TID PRN Dahlia Byes C, NP       magnesium hydroxide (MILK OF MAGNESIA) suspension 30 mL  30 mL Oral Daily PRN Welford Roche, Josephine C, NP       nicotine (NICODERM CQ - dosed in mg/24 hours) patch 14 mg  14 mg Transdermal Daily Onuoha, Chinwendu V, NP   14 mg at 01/25/23 0734   OLANZapine zydis (ZYPREXA) disintegrating tablet 10 mg  10 mg Oral BID Aleene Swanner, Hosie Poisson, MD       valbenazine (INGREZZA) capsule 40 mg  40 mg Oral Daily Dahlia Byes C, NP   40 mg at 01/25/23 0734   PTA Medications: Facility-Administered Medications Prior to Admission  Medication Dose Route Frequency Provider Last Rate Last Admin   ARIPiprazole ER (ABILIFY MAINTENA) 400 MG prefilled syringe 400 mg  400 mg Intramuscular Q30 days Zena Amos, MD   400 mg at 12/29/22 1017   Medications Prior to Admission  Medication Sig Dispense Refill Last Dose   amoxicillin-clavulanate (AUGMENTIN) 875-125 MG tablet Take 1 tablet by mouth every 12 (twelve) hours. (Patient not taking: Reported on 01/23/2023) 14 tablet 0    ARIPiprazole ER (ABILIFY MAINTENA) 400 MG  PRSY prefilled syringe Inject 400 mg into the muscle every 28 (twenty-eight) days. (Patient taking differently: Inject 400 mg into the muscle  every 28 (twenty-eight) days.) 1 each 10    benztropine (COGENTIN) 0.5 MG tablet Take 0.5 mg by mouth 2 (two) times daily.      gabapentin (NEURONTIN) 300 MG capsule Take 1 capsule (300 mg total) by mouth 3 (three) times daily. 90 capsule 3    ibuprofen (ADVIL) 800 MG tablet Take 1 tablet (800 mg total) by mouth 3 (three) times daily. 21 tablet 0    valbenazine (INGREZZA) 40 MG capsule Take 1 capsule (40 mg total) by mouth daily. 30 capsule 3     Musculoskeletal: Strength & Muscle Tone: within normal limits Gait & Station: normal Patient leans: N/A            Psychiatric Specialty Exam:  Patient is a 27 year old African-American male who appears stated age thin build alert oriented x 2 mood is upset affect is irritable significant agitation noted quite disorganized and rambling speech pattern noted extremely paranoid pacing in the hallways minimizes behaviors trying to elope Insight and judgment limited.  Assets  Assets: Manufacturing systems engineer; Desire for Improvement; Physical Health   Sleep  Sleep: Sleep: Fair    Physical Exam: Physical Exam ROS Blood pressure (!) 140/80, pulse 60, temperature 98.3 F (36.8 C), temperature source Oral, resp. rate 16, height 6' (1.829 m), weight 86.2 kg, SpO2 99 %. Body mass index is 25.77 kg/m.  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) Long Term Goal(s): Improvement in symptoms so as ready for discharge  Short Term  Goals: Ability to identify changes in lifestyle to reduce recurrence of condition will improve  Physician Treatment Plan for Secondary Diagnosis: Principal Problem:   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    Timmie Foerster, MD 5/8/202412:51 PM

## 2023-01-25 NOTE — Progress Notes (Addendum)
Patient ID: Vincent [name], male   DOB: May 19, 1996, 27 y.o.   MRN: [id number]  Pt continues to yell and slam his room door. Pt attempting to elope from unit stating that he wants to leave. Seclusion order initiated per MD. Pt willingly went to seclusion room without incident.

## 2023-01-25 NOTE — Progress Notes (Signed)
   01/25/23 2045  Psych Admission Type (Psych Patients Only)  Admission Status Involuntary  Psychosocial Assessment  Patient Complaints Anxiety;Agitation;Irritability  Eye Contact Fair  Facial Expression Animated;Anxious  Affect Anxious;Labile  Speech Pressured  Interaction Assertive  Motor Activity Slow  Appearance/Hygiene In scrubs  Behavior Characteristics Agressive verbally  Mood Labile;Preoccupied  Aggressive Behavior  Effect No apparent injury  Thought Chartered certified accountant of ideas;Circumstantial  Content Blaming others  Delusions None reported or observed  Perception WDL  Hallucination None reported or observed  Judgment Impaired  Confusion None  Danger to Self  Current suicidal ideation? Denies  Danger to Others  Danger to Others None reported or observed

## 2023-01-25 NOTE — Progress Notes (Signed)
   01/25/23 0823  Psych Admission Type (Psych Patients Only)  Admission Status Involuntary  Psychosocial Assessment  Patient Complaints Anger;Anxiety;Irritability;Worrying  Eye Contact Fair  Facial Expression Anxious;Animated  Affect Labile;Irritable;Angry  Speech Argumentative;Loud;Tangential  Interaction Assertive  Motor Activity Other (Comment) (WNL)  Appearance/Hygiene In scrubs  Behavior Characteristics Agressive verbally;Irritable  Mood Labile;Preoccupied  Thought Process  Coherency Flight of ideas;Circumstantial;Concrete thinking;Tangential;Disorganized  Content Blaming others;Preoccupation  Delusions None reported or observed  Perception WDL  Hallucination None reported or observed  Judgment Impaired  Confusion Mild  Danger to Self  Current suicidal ideation? Denies  Danger to Others  Danger to Others None reported or observed

## 2023-01-25 NOTE — Progress Notes (Signed)
The patient keeps coming out of his room but does not have a specific need at this time. He has not slept at all this shift and either lays in his bed or on the floor behind the bedroom door.

## 2023-01-25 NOTE — Progress Notes (Addendum)
Patient ID: Vincent [name], male   DOB: 12-Jul-1996, 27 y.o.   MRN: [id number]   Pt's designated emergency restraint/seclusion contact called. Pt's mother, Mal Amabile ([phone]) called and made aware of pt's seclusion order. Pt's mother was provided support and encouragement and denies any concerns at this time.

## 2023-01-25 NOTE — Progress Notes (Signed)
Adult Psychoeducational Group Note  Date:  01/25/2023 Time:  9:04 PM  Group Topic/Focus:  Goals Group:   The focus of this group is to help patients establish daily goals to achieve during treatment and discuss how the patient can incorporate goal setting into their daily lives to aide in recovery.  Participation Level:  Minimal  Participation Quality:  Appropriate  Affect:  Appropriate  Cognitive:  Appropriate  Insight: Appropriate  Engagement in Group:  Engaged  Modes of Intervention:  Discussion  Additional Comments:  Pt stated his day was okay.  Pt did not have a goal for the day.  Wynema Birch D 01/25/2023, 9:04 PM

## 2023-01-25 NOTE — Group Note (Signed)
Recreation Therapy Group Note   Group Topic:Goal Setting  Group Date: 01/25/2023 Start Time: 1015 End Time: 1052 Facilitators: Gabrelle Roca-McCall, LRT,CTRS Location: 500 Hall Dayroom   Goal Area(s) Addresses:  Patient will identify long-term goal they wish to accomplish.  Patient will reflect on short-term steps and support they will need to reach their goal(s).  Group Description: DREAM. Patients were provided a sheet with the word dream spelled out in large block letters.  Patients were to write all the goals they wish to accomplish inside the letters that spell dream.  Patients would then write what is preventing them from reaching their goals on the outside of the letters. Lastly, patients would identify ways to overcome the challenges they face.    Affect/Mood: N/A   Participation Level: Did not attend    Clinical Observations/Individualized Feedback:     Plan: Continue to engage patient in RT group sessions 2-3x/week.   Vincen Bejar-McCall, LRT,CTRS 01/25/2023 12:04 PM

## 2023-01-25 NOTE — Progress Notes (Signed)
Pt does not have sleep medication , pt has not slept none this evening , sleep medication was requested at 0000 after pt was given PRN benadryl , Ativan , Haldol per [facility] , but was not ordered

## 2023-01-25 NOTE — Progress Notes (Addendum)
Patient ID: Vincent [name], male   DOB: 12/14/1995, 27 y.o.   MRN: [id number]   Pt's seclusion order was discharged at 1500. No injury noted and pt's vitals were within normal limits. Pt returned to his room and provided with a sandwich and beverage. At 1536 pt began going into other pt's rooms and became agitated when staff attempted to redirect him. Pt slammed his bathroom door and bedroom door and became verbal aggressive and threatened nursing staff. Pt disorganized and confused and began yelling and cursing. MD notified and another seclusion order was given. No restraint/manual hold or assist was needed. Pt walked to seclusion room with no incident. 1:1 staff at door in seclusion room. Pt was assessed face-to-face by MD on unit. Pt remains safe on the unit.

## 2023-01-25 NOTE — Progress Notes (Signed)
Pt given PRN Ativan, Benadryl and Haldol per MAR, pt continues to be paranoid not sleeping

## 2023-01-25 NOTE — BH IP Treatment Plan (Signed)
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 [facility]) Active Problems:   Schizoaffective disorder, bipolar type (HCC)   Current Medications:  Current Facility-Administered Medications  Medication Dose Route Frequency Provider Last Rate Last Admin   acetaminophen (TYLENOL) tablet 650 mg  650 mg Oral Q6H PRN Dahlia Byes C, NP   650 mg at 01/25/23 0035   alum & mag hydroxide-simeth (MAALOX/MYLANTA) 200-200-20 MG/5ML suspension 30 mL  30 mL Oral Q4H PRN Earney Navy, NP       [START ON 01/26/2023] ARIPiprazole ER (ABILIFY MAINTENA) injection 400 mg  400 mg Intramuscular Once Madaram, Kondal R, MD       benztropine (COGENTIN) tablet 0.5 mg  0.5 mg Oral BID Dahlia Byes C, NP   0.5 mg at 01/25/23 0734   diphenhydrAMINE (BENADRYL) capsule 50 mg  50 mg Oral TID PRN Earney Navy, NP   50 mg at 01/25/23 0522   Or   diphenhydrAMINE (BENADRYL) injection 50 mg  50 mg Intramuscular TID PRN Earney Navy, NP       gabapentin (NEURONTIN) capsule 600 mg  600 mg Oral TID Loni Beckwith R, MD   600 mg at 01/25/23 1446   haloperidol (HALDOL) tablet 5 mg  5 mg Oral TID PRN Dahlia Byes C, NP   5 mg at 01/25/23 0522   Or   haloperidol lactate (HALDOL) injection 5 mg  5 mg Intramuscular TID PRN Earney Navy, NP       LORazepam (ATIVAN) tablet 2 mg  2 mg Oral TID PRN Dahlia Byes C, NP   2 mg at 01/25/23 4098   Or   LORazepam (ATIVAN) injection 2 mg  2 mg Intramuscular TID PRN Dahlia Byes C, NP       magnesium hydroxide (MILK OF MAGNESIA) suspension 30 mL  30 mL Oral Daily PRN Welford Roche, Josephine C, NP       nicotine (NICODERM CQ - dosed in  mg/24 hours) patch 14 mg  14 mg Transdermal Daily Onuoha, Chinwendu V, NP   14 mg at 01/25/23 0734   OLANZapine zydis (ZYPREXA) disintegrating tablet 10 mg  10 mg Oral BID Madaram, Hosie Poisson, MD       valbenazine (INGREZZA) capsule 40 mg  40 mg Oral Daily Onuoha, Josephine C, NP   40 mg at 01/25/23 0734   PTA Medications: Facility-Administered Medications Prior to Admission  Medication Dose Route Frequency Provider Last Rate Last Admin   ARIPiprazole ER (ABILIFY MAINTENA) 400 MG prefilled syringe 400 mg  400 mg Intramuscular Q30 days Zena Amos, MD   400 mg at 12/29/22 1017   Medications Prior to Admission  Medication Sig Dispense Refill Last Dose   amoxicillin-clavulanate (AUGMENTIN) 875-125 MG tablet Take 1 tablet by mouth every 12 (twelve) hours. (Patient not taking: Reported on 01/23/2023) 14 tablet 0    ARIPiprazole ER (ABILIFY MAINTENA) 400 MG PRSY prefilled syringe Inject 400 mg into the muscle every 28 (twenty-eight) days. (Patient taking differently: Inject 400 mg into the muscle every 28 (twenty-eight) days.) 1 each 10    benztropine (COGENTIN) 0.5 MG tablet Take 0.5 mg by mouth 2 (two) times daily.      gabapentin (NEURONTIN) 300 MG capsule Take 1 capsule (300 mg total) by mouth  3 (three) times daily. 90 capsule 3    ibuprofen (ADVIL) 800 MG tablet Take 1 tablet (800 mg total) by mouth 3 (three) times daily. 21 tablet 0    valbenazine (INGREZZA) 40 MG capsule Take 1 capsule (40 mg total) by mouth daily. 30 capsule 3     Patient Stressors: Marital or family conflict   Medication change or noncompliance   Substance abuse    Patient Strengths: General fund of knowledge  Motivation for treatment/growth  Supportive family/friends   Treatment Modalities: Medication Management, Group therapy, Case management,  1 to 1 session with clinician, Psychoeducation, Recreational therapy.   Physician Treatment Plan for Primary Diagnosis: Substance or medication-induced psychotic disorder  with onset during intoxication (HCC) Long Term Goal(s): Improvement in symptoms so as ready for discharge   Short Term Goals: Ability to demonstrate self-control will improve Ability to identify changes in lifestyle to reduce recurrence of condition will improve  Medication Management: Evaluate patient's response, side effects, and tolerance of medication regimen.  Therapeutic Interventions: 1 to 1 sessions, Unit Group sessions and Medication administration.  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  Therapeutic Interventions: 1 to 1 sessions, Unit Group sessions and Medication administration.  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) Long Term Goal(s): Knowledge of disease and therapeutic regimen to maintain health will improve  Short Term Goals: Ability to remain free from injury will improve, Ability to verbalize frustration and anger appropriately will improve, Ability to demonstrate self-control, Ability to participate in decision making will improve, Ability to verbalize feelings will improve, Ability to disclose and discuss suicidal ideas, Ability to identify and develop effective coping behaviors will improve, and Compliance with prescribed medications will improve  Medication Management: RN will  administer medications as ordered by provider, will assess and evaluate patient's response and provide education to patient for prescribed medication. RN will report any adverse and/or side effects to prescribing provider.  Therapeutic Interventions: 1 on 1 counseling sessions, Psychoeducation, Medication administration, Evaluate responses to treatment, Monitor vital signs and CBGs as ordered, Perform/monitor CIWA, COWS, AIMS and Fall Risk screenings as ordered, Perform wound care treatments as ordered.  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  Short Term Goals: Engage patient in aftercare planning with referrals and resources, Increase social support, Increase ability to appropriately verbalize feelings, Increase emotional regulation, Facilitate acceptance of mental health diagnosis and concerns, Facilitate patient progression through stages of change regarding substance use diagnoses and concerns, Identify triggers associated with mental health/substance abuse issues, and Increase skills for wellness and recovery  Therapeutic Interventions: Assess for all discharge needs, 1 to 1 time with Social worker, Explore available resources and support systems, Assess for adequacy in community support network, Educate family and  significant other(s) on suicide prevention, Complete Psychosocial Assessment, Interpersonal group therapy.  Evaluation of Outcomes: Not Progressing   Progress in Treatment: Attending groups: No. Participating in groups: No. Taking medication as prescribed: Yes. Toleration medication: Yes. Family/Significant other contact made: No, will contact:  Whoever patient gives CSW to contact  Patient  understands diagnosis: No. Discussing patient identified problems/goals with staff: No. Medical problems stabilized or resolved: Yes. Denies suicidal/homicidal ideation: No. Unable to see patient due to him being in the seclusion  Issues/concerns per patient self-inventory: No.   New problem(s) identified: No, Describe:  None Reported   New Short Term/Long Term Goal(s):medication stabilization, elimination of SI thoughts, development of comprehensive mental wellness plan.    Patient Goals:  DID NOT ATTEND   Discharge Plan or Barriers: Patient recently admitted. CSW will continue to follow and assess for appropriate referrals and possible discharge planning.    Reason for Continuation of Hospitalization: Aggression Homicidal ideation Mania Medication stabilization  Estimated Length of Stay: 5-7 Days  Last 3 [place] Suicide Severity Risk Score: Flowsheet Row Ad[facility] (Current) [facility] from 01/23/2023 in [facility] ED from 01/06/2023 in [facility] Department [facility]  C-SSRS RISK CATEGORY No Risk No Risk No Risk       Last PHQ 2/9 Scores:    12/29/2022   11:10 AM 11/24/2022    2:22 PM 06/09/2022   12:22 PM  Depression screen PHQ 2/9  Decreased Interest 3 3 0  Down, Depressed, Hopeless 3 1 0  PHQ - 2 Score 6 4 0  Altered sleeping 3 0   Tired, decreased energy 3 0   Change in appetite 1 3   Feeling bad or failure about yourself  3 2   Trouble concentrating 3 0   Moving slowly or fidgety/restless 3 0   Suicidal thoughts 3 0   PHQ-9 Score 25 9   Difficult doing work/chores Extremely dIfficult Not difficult at all     Scribe for Treatment Team: Isabella Bowens, Theresia Majors 01/25/2023 3:05 PM

## 2023-01-25 NOTE — Progress Notes (Signed)
Pt released out of seclusion at 1930 , pt continues to be manic needing constant redirection , notified providers trying to get sleep medication again for pt , waiting on response.

## 2023-01-25 NOTE — Progress Notes (Signed)
Pt continues to be manic, pt constantly coming out room needing redirection, pt appearing to talk to people not seen at times.

## 2023-01-25 NOTE — Plan of Care (Signed)
  Problem: Physical Regulation: Goal: Ability to maintain clinical measurements within normal limits will improve Outcome: Not Progressing   Problem: Education: Goal: Will be free of psychotic symptoms Outcome: Not Progressing   Problem: Safety: Goal: Ability to redirect hostility and anger into socially appropriate behaviors will improve Outcome: Not Progressing   Problem: Coping: Goal: Ability to identify and develop effective coping behavior will improve Outcome: Not Progressing   Problem: Safety: Goal: Violent Restraint(s) Outcome: Not Progressing Note: Seclusion

## 2023-01-25 NOTE — BH Assessment (Addendum)
Pt continues to be labile on unit. Pt slamming room doors and verbally threatening staff. Pt yelling and cursing at RN staff and MD. Benadryl 50 mg and Haldol 5 mg given per MD order. Pt willingly took medication without incident.

## 2023-01-25 NOTE — Progress Notes (Signed)
Pt given PRN Trazodone earlier without any affect, NP ordered another 150 mg Trazodone and that was given , will monitor

## 2023-01-25 NOTE — B[facility] Counselor (Signed)
[facility] LCSW Progress Note   01/25/2023    10:31 AM  Vincent [name]   [id number]   Type of Contact and Topic:  Assessment attempt  CSW attempted to do an assessment. Patient is in seclusion.  CSW will attempt at a more appropriate time.     Signed:  Anson [place] MSW, LCSW, LCAS 01/25/2023 10:31 AM

## 2023-01-26 ENCOUNTER — Ambulatory Visit (HOSPITAL_COMMUNITY): Payer: Medicaid Other

## 2023-01-26 DIAGNOSIS — F250 Schizoaffective disorder, bipolar type: Secondary | ICD-10-CM | POA: Diagnosis not present

## 2023-01-26 MED ORDER — LORAZEPAM 1 MG PO TABS
1.0000 mg | ORAL_TABLET | Freq: Three times a day (TID) | ORAL | Status: DC
  Administered 2023-01-26 – 2023-01-27 (×4): 1 mg via ORAL
  Filled 2023-01-26 (×4): qty 1

## 2023-01-26 MED ORDER — HALOPERIDOL LACTATE 5 MG/ML IJ SOLN
10.0000 mg | Freq: Three times a day (TID) | INTRAMUSCULAR | Status: DC | PRN
  Administered 2023-01-26 – 2023-01-27 (×2): 10 mg via INTRAMUSCULAR
  Filled 2023-01-26 (×2): qty 2

## 2023-01-26 MED ORDER — HALOPERIDOL 5 MG PO TABS: 10.0000 mg | ORAL_TABLET | Freq: Three times a day (TID) | ORAL | Status: DC | PRN

## 2023-01-26 MED ORDER — ARIPIPRAZOLE ER 400 MG IM SRER
400.0000 mg | INTRAMUSCULAR | Status: DC
  Administered 2023-01-26: 400 mg via INTRAMUSCULAR

## 2023-01-26 MED ORDER — TRAZODONE HCL 100 MG PO TABS
200.0000 mg | ORAL_TABLET | Freq: Every day | ORAL | Status: DC
  Administered 2023-01-26 – 2023-01-31 (×6): 200 mg via ORAL
  Filled 2023-01-26 (×7): qty 2

## 2023-01-26 MED ORDER — CLONIDINE HCL 0.1 MG PO TABS
0.1000 mg | ORAL_TABLET | Freq: Two times a day (BID) | ORAL | Status: DC
  Administered 2023-01-26 – 2023-01-27 (×3): 0.1 mg via ORAL
  Filled 2023-01-26 (×6): qty 1

## 2023-01-26 MED ORDER — OLANZAPINE 10 MG PO TBDP
10.0000 mg | ORAL_TABLET | Freq: Two times a day (BID) | ORAL | Status: DC
  Administered 2023-01-26 – 2023-01-27 (×3): 10 mg via ORAL
  Filled 2023-01-26 (×5): qty 1

## 2023-01-26 NOTE — Group Note (Signed)
Recreation Therapy Group Note   Group Topic:Health and Wellness  Group Date: 01/26/2023 Start Time: 1010 End Time: 1045 Facilitators: Gorman Safi-McCall, LRT,CTRS Location: 500 Hall Dayroom   Goal Area(s) Addresses:  Patient will verbalize benefit of exercise during group session. Patient will identify an exercise that can be completed post d/c.  Group Description:  Exercise.  LRT and patients discussed the importance of exercise and benefits.  Patients then completed a round of stretches to get loose for the next phase of the activity.  Patients then took turns leading the group in the exercises of their choosing.  Patients were encouraged to get water or take breaks when needed.   Affect/Mood: N/A   Participation Level: Did not attend    Clinical Observations/Individualized Feedback:     Plan: Continue to engage patient in RT group sessions 2-3x/week.   Yulissa Needham-McCall, LRT,CTRS 01/26/2023 11:36 AM

## 2023-01-26 NOTE — Plan of Care (Signed)
  Problem: Safety: Goal: Periods of time without injury will increase Outcome: Progressing   Problem: Health Behavior/Discharge Planning: Goal: Compliance with prescribed medication regimen will improve Outcome: Progressing   

## 2023-01-26 NOTE — Progress Notes (Signed)
Pt continues to be up, pt responding to internal stimuli at times, pt presenting with AVH , pt thinks he is at other locations other than the hospital. Pt constantly talks about drugs and trying to get some

## 2023-01-26 NOTE — Progress Notes (Signed)
Pt is awake and pacing in the hallway. Pt walking towards other pt's rooms. Pt refuses to be redirected. Pt is posturing and verbally threatening to kill staff on the unit. "I will kill all of you. Don't tell me where to go. Open these doors right now." Pt asked to return to his room. Slams room door and bathroom door. Returns to hallway and again cursing and threatening staff. Trying to open door to dayroom and doors to unit.

## 2023-01-26 NOTE — Progress Notes (Signed)
[facility] MD Progress Note  01/26/2023 2:28 PM Vincent [name]  MRN:  [id number]  Subjective:   Patient is a 27 year old African-American male who presented to the hospital escorted by police department with increased agitation and "manic behavior" patient reportedly was irate and agitated screaming stomping his feet, he talked about needing an injection reportedly he was on Abilify Maintena 400 mg IM but has not received it recently., Patient reportedly was found on the street cursing and yelling at passerby stating to them he was pulling off the shirt to throw in the street patient reportedly made significant comments such as "I am going to fuck that [place] bitch for taking my phone, patient continues to be extremely agitated and was transferred to the inpatient floor upon arrival he continued to be very bizarre irritable aggressive pacing in the hallways sometimes rambling and very difficult to discern what he was saying.   Yesterday, the patient was in seclusion, was evaluated by Dr Megan Mans.   Overnight, the patient was agitated and required as needed medication.  Per nursing documentation: Patient was responding to internal stimuli, disoriented, trying to get drugs, pacing in the hallway, walking towards other patients room, posturing and verbally threatened to kill staff on the unit "I will kill all of you.  Do not tell me where to go.  Over the stores right now".  Slamming doors and threatening staff.  Making sexually explicit comments to male staff.  On my examination this morning, I did wait to examine him until he had finished sleeping, which was around 1 PM, due to agitation and insomnia, if patient was experiencing last night as documented above.  The patient states that "I am happy having manic attacks and last night I was searching for my clothes and other people's rooms".  During the interview he is tangential.  He denies having any AH or VH.  He states that the nurses "are genius  workers.  They are mind readers".  He is overall paranoid and psychotic, and manic.  We discussed the medication changes made at this morning, upon chart review, and that we would restart the Abilify, as the patient states "I have been on it for 10 years and it works well for me."  Mood is irritable.  Sleep was poor until the patient received agitation as needed medication, in addition to Eksir trazodone as ordered by the on-call NP overnight.  Appetite is good, patient was eating 2 trays of lunch during the interview.  Denying any SI or HI on my exam.  Denies any side effects to the medications.     Principal Problem: Schizoaffective disorder, bipolar type (HCC) Diagnosis: Principal Problem:   Schizoaffective disorder, bipolar type (HCC) Active Problems:   Cannabis use disorder, severe, dependence (HCC)  Total Time spent with patient: 15 minutes  Past Psychiatric History:  Schizoaffective disorder, [place] as outpatient with Abilify Maintena 400 mg IM LAI, last dose was ?12-29-22?   Past Medical History:  Past Medical History:  Diagnosis Date   Anxiety    Schizophrenia (HCC)     Past Surgical History:  Procedure Laterality Date   MANDIBLE SURGERY     WRIST SURGERY     laceration by glass that pt sts was closed with surgery   Family History: History reviewed. No pertinent family history. Family Psychiatric  History: See H&P Social History:  Social History   Substance and Sexual Activity  Alcohol Use Yes   Alcohol/week: 1.0 standard drink of alcohol  Types: 1 Shots of liquor per week   Comment: occasionally/ maybe once a month     Social History   Substance and Sexual Activity  Drug Use Yes   Types: Marijuana, Amphetamines, Cocaine   Comment: 1-2 zx per month    Social History   Socioeconomic History   Marital status: Single    Spouse name: Not on file   Number of children: Not on file   Years of education: Not on file   Highest education level: Not on file   Occupational History   Not on file  Tobacco Use   Smoking status: Every Day    Packs/day: 1.00    Years: 2.00    Additional pack years: 0.00    Total pack years: 2.00    Types: Cigarettes   Smokeless tobacco: Never  Vaping Use   Vaping Use: Never used  Substance and Sexual Activity   Alcohol use: Yes    Alcohol/week: 1.0 standard drink of alcohol    Types: 1 Shots of liquor per week    Comment: occasionally/ maybe once a month   Drug use: Yes    Types: Marijuana, Amphetamines, Cocaine    Comment: 1-2 zx per month   Sexual activity: Not Currently    Birth control/protection: None  Other Topics Concern   Not on file  Social History Narrative   Not on file   Social Determinants of Health   Financial Resource Strain: Not on file  Food Insecurity: Food Insecurity Present (01/24/2023)   Hunger Vital Sign    Worried About Running Out of Food in the Last Year: Sometimes true    Ran Out of Food in the Last Year: Sometimes true  Transportation Needs: Unmet Transportation Needs (01/24/2023)   PRAPARE - Administrator, Civil Service (Medical): Yes    Lack of Transportation (Non-Medical): Yes  Physical Activity: Not on file  Stress: Not on file  Social Connections: Not on file   Additional Social History:                           Current Medications: Current Facility-Administered Medications  Medication Dose Route Frequency Provider Last Rate Last Admin   acetaminophen (TYLENOL) tablet 650 mg  650 mg Oral Q6H PRN Dahlia Byes C, NP   650 mg at 01/25/23 0035   alum & mag hydroxide-simeth (MAALOX/MYLANTA) 200-200-20 MG/5ML suspension 30 mL  30 mL Oral Q4H PRN Dahlia Byes C, NP       ARIPiprazole ER (ABILIFY MAINTENA) injection 400 mg  400 mg Intramuscular Q28 days Greyson Peavy, Harrold Donath, MD       cloNIDine (CATAPRES) tablet 0.1 mg  0.1 mg Oral Q12H Kammi Hechler, MD   0.1 mg at 01/26/23 0827   diphenhydrAMINE (BENADRYL) capsule 50 mg  50 mg Oral  TID PRN Dahlia Byes C, NP   50 mg at 01/26/23 0224   Or   diphenhydrAMINE (BENADRYL) injection 50 mg  50 mg Intramuscular TID PRN Dahlia Byes C, NP       gabapentin (NEURONTIN) capsule 600 mg  600 mg Oral TID Madaram, Kondal R, MD   600 mg at 01/26/23 1347   haloperidol (HALDOL) tablet 10 mg  10 mg Oral TID PRN Tamsen Reist, Harrold Donath, MD       Or   haloperidol lactate (HALDOL) injection 10 mg  10 mg Intramuscular TID PRN Tarryn Bogdan, Harrold Donath, MD       LORazepam (ATIVAN) tablet 2  mg  2 mg Oral TID PRN Dahlia Byes C, NP   2 mg at 01/26/23 9629   Or   LORazepam (ATIVAN) injection 2 mg  2 mg Intramuscular TID PRN Earney Navy, NP       LORazepam (ATIVAN) tablet 1 mg  1 mg Oral Q8H Daniil Labarge, MD   1 mg at 01/26/23 1347   magnesium hydroxide (MILK OF MAGNESIA) suspension 30 mL  30 mL Oral Daily PRN Dahlia Byes C, NP       nicotine (NICODERM CQ - dosed in mg/24 hours) patch 14 mg  14 mg Transdermal Daily Onuoha, Chinwendu V, NP   14 mg at 01/25/23 0734   OLANZapine zydis (ZYPREXA) disintegrating tablet 10 mg  10 mg Oral Q12H Korryn Pancoast, Harrold Donath, MD   10 mg at 01/26/23 0825   traZODone (DESYREL) tablet 200 mg  200 mg Oral QHS Kahlin Mark, Harrold Donath, MD       traZODone (DESYREL) tablet 50 mg  50 mg Oral QHS PRN Sindy Guadeloupe, NP   50 mg at 01/25/23 2120   valbenazine (INGREZZA) capsule 40 mg  40 mg Oral Daily Dahlia Byes C, NP   40 mg at 01/26/23 5284    Lab Results: No results found for this or any previous visit (from the past 48 hour(s)).  Blood Alcohol level:  Lab Results  Component Value Date   ETH <10 01/23/2023   ETH <5 10/17/2014    Metabolic Disorder Labs: Lab Results  Component Value Date   HGBA1C 5.7 (H) 08/04/2022   MPG 114 10/21/2014   Lab Results  Component Value Date   PROLACTIN 0.8 (L) 08/04/2022   Lab Results  Component Value Date   CHOL 138 08/04/2022   TRIG 70 08/04/2022   HDL 50 08/04/2022   CHOLHDL 2.8 08/04/2022   VLDL 28  10/21/2014   LDLCALC 74 08/04/2022   LDLCALC 63 10/21/2014    Physical Findings: AIMS:  , ,  ,  ,    CIWA:    COWS:     Musculoskeletal: Strength & Muscle Tone: within normal limits Gait & Station: normal Patient leans: N/A  Psychiatric Specialty Exam:  Presentation  General Appearance:  Disheveled  Eye Contact: Poor  Speech: Pressured  Speech Volume: Increased  Handedness: Right   Mood and Affect  Mood: Anxious; Labile  Affect: Full Range   Thought Process  Thought Processes: Goal Directed  Descriptions of Associations:Tangential  Orientation:Partial  Thought Content:Illogical; Paranoid Ideation  History of Schizophrenia/Schizoaffective disorder:Yes  Duration of Psychotic Symptoms:Greater than six months  Hallucinations:Hallucinations: Auditory  Ideas of Reference:Paranoia  Suicidal Thoughts:Suicidal Thoughts: No  Homicidal Thoughts:Homicidal Thoughts: No   Sensorium  Memory: Recent Poor; Immediate Poor; Remote Fair  Judgment: Impaired  Insight: Lacking   Executive Functions  Concentration: Poor  Attention Span: Poor  Recall: Good  Fund of Knowledge: Good  Language: Good   Psychomotor Activity  Psychomotor Activity: Psychomotor Activity: Restlessness   Assets  Assets: Communication Skills; Desire for Improvement; Physical Health   Sleep  Sleep: Sleep: Fair    Physical Exam: Physical Exam Vitals reviewed.  Constitutional:      Appearance: He is normal weight.  Pulmonary:     Effort: Pulmonary effort is normal.  Neurological:     Mental Status: He is alert.     Motor: No weakness.     Gait: Gait normal.    Review of Systems  Constitutional:  Negative for chills and fever.  Cardiovascular:  Negative for chest  pain and palpitations.  Neurological:  Negative for dizziness, tingling, tremors and headaches.  Psychiatric/Behavioral:  Positive for hallucinations and substance abuse. The patient is  nervous/anxious and has insomnia.    Blood pressure (!) 144/102, pulse 99, temperature 98.8 F (37.1 C), temperature source Oral, resp. rate 16, height 6' (1.829 m), weight 86.2 kg, SpO2 99 %. Body mass index is 25.77 kg/m.   Treatment Plan Summary: Daily contact with patient to assess and evaluate symptoms and progress in treatment and Medication management  ASSESSMENT:  Diagnoses / Active Problems: -Schizoaffective disorder bipolar type -GAD -Rule out TD on Ingrezza, no current TD symptoms, recently started Ingrezza  PLAN: Safety and Monitoring:  -- Involuntary admission to inpatient psychiatric unit for safety, stabilization and treatment  -- Daily contact with patient to assess and evaluate symptoms and progress in treatment  -- Patient's case to be discussed in multi-disciplinary team meeting  -- Observation Level : q15 minute checks  -- Vital signs:  q12 hours  -- Precautions: suicide, elopement, and assault  2. Psychiatric Diagnoses and Treatment:    -Change Zyprexa 10 mg twice daily to every 12 hours -Stop Cogentin due to concern for TD -Continue and Ingrezza 40 mg once daily for TD, started as outpatient on 12/29/2022 -Start clonidine 0.1 mg every 12 hours for impulse control -Ordered Abilify Maintena 400 mg LAI -Increase Haldol as needed for agitation from 5 mg to 10 mg PRN (PO or IM)  -Start Ativan 1 mg every 8 hours for anxiety -Start trazodone 200 mg nightly for insomnia    --  The risks/benefits/side-effects/alternatives to this medication were discussed in detail with the patient and time was given for questions. The patient consents to medication trial.    -- Metabolic profile and EKG monitoring obtained while on an atypical antipsychotic (BMI: Lipid Panel: HbgA1c: QTc:)   -- Encouraged patient to participate in unit milieu and in scheduled group therapies   -- Short Term Goals: Ability to identify changes in lifestyle to reduce recurrence of condition will  improve, Ability to verbalize feelings will improve, Ability to disclose and discuss suicidal ideas, Ability to demonstrate self-control will improve, Ability to identify and develop effective coping behaviors will improve, Ability to maintain clinical measurements within normal limits will improve, Compliance with prescribed medications will improve, and Ability to identify triggers associated with substance abuse/mental health issues will improve  -- Long Term Goals: Improvement in symptoms so as ready for discharge    3. Medical Issues Being Addressed:   Tobacco Use Disorder  -- Nicotine patch 14 mg/24 hours ordered  -- Smoking cessation encouraged  4. Discharge Planning:   -- Social work and case management to assist with discharge planning and identification of hospital follow-up needs prior to discharge  -- Estimated LOS: 5-7 days  -- Discharge Concerns: Need to establish a safety plan; Medication compliance and effectiveness  -- Discharge Goals: Return home with outpatient referrals for mental health follow-up including medication management/psychotherapy    Cristy Hilts, MD 01/26/2023, 2:28 PM  Total Time Spent in Direct Patient Care:  I personally spent 35 minutes on the unit in direct patient care. The direct patient care time included face-to-face time with the patient, reviewing the patient's chart, communicating with other professionals, and coordinating care. Greater than 50% of this time was spent in counseling or coordinating care with the patient regarding goals of hospitalization, psycho-education, and discharge planning needs.   Phineas Inches, MD Psychiatrist

## 2023-01-26 NOTE — Progress Notes (Signed)
During visitation, patient became irritable and started to pace demanding to get out of here. Patient continued to get loud and started to curse/yell at staff. Patient started making threats/posturing and slamming his door. Attempted to redirect patient without success, patient continued to curse at staff disrupting visitation time. Visitation canceled and patient was guided back to room by staff. Patient received haldol, ativan, and benadryl IM PRN due to continued agitation. No hold required.

## 2023-01-26 NOTE — Progress Notes (Signed)
Pt was given PO agitation protocol per [facility], pt continued to escalate, writer tried to verbally de-escalate pt, pt continued to remain agitated, pt appeared very psychotic at times, pt demanding to leave stating we could not hold him here. Pt did go to his room without incident, but pt was verbally abusive towards staff with minimal re-direction.

## 2023-01-26 NOTE — Progress Notes (Addendum)
Patient appears more cooperative today although still labile and still making sexual comments at staff or in general and cursing at times. Patient is med compliant and became irritable when learning his abilify injection was scheduled. Patient managed to be de-escalated and redirected after providing explanation and was compliant with LAI. Patient has slept throughout day but has been awake for all meals and eating adequately. Patient has remained cooperative when awake with redirection and has not required prns thus far this shift. Patient in no current distress. No violent/aggressive behavior.

## 2023-01-26 NOTE — Progress Notes (Signed)
Pt received Agitation protocol from previous shift due to pt escalating, pt was feeding off another pt disrupting the unit talking about leaving, pt threatening to come back up to [facility] and shoot up the place. Pt continued to be threatening and posturing towards staff, talking derogatory to staff.     01/26/23 2000  Psych Admission Type (Psych Patients Only)  Admission Status Involuntary  Psychosocial Assessment  Patient Complaints Agitation;Restlessness  Eye Contact Fair  Facial Expression Animated;Anxious  Affect Anxious;Labile  Speech Pressured  Interaction Assertive  Motor Activity Slow  Appearance/Hygiene In scrubs  Behavior Characteristics Agitated;Pacing;Posturing;Restless  Mood Labile;Preoccupied  Aggressive Behavior  Effect No apparent injury  Thought Chartered certified accountant of ideas;Circumstantial  Content Blaming others  Delusions None reported or observed  Perception WDL  Hallucination None reported or observed  Judgment Impaired  Confusion None  Danger to Self  Current suicidal ideation? Denies  Danger to Others  Danger to Others None reported or observed

## 2023-01-26 NOTE — Group Note (Signed)
Date:  01/26/2023 Time:  9:53 AM  Group Topic/Focus:  Goals Group:   The focus of this group is to help patients establish daily goals to achieve during treatment and discuss how the patient can incorporate goal setting into their daily lives to aide in recovery.    Participation Level:  Did Not Attend  Participation Quality:    Affect:    Cognitive:    Insight:   Engagement in Group:    Modes of Intervention:    Additional Comments:    [name] 01/26/2023, 9:53 AM

## 2023-01-26 NOTE — Progress Notes (Addendum)
Pt again in the hallway on unit. Pt has received agitation protocol meds PO from his nurse. Pt yelling, cursing and threatening to kill staff. Sexually explicit comments made to male staff. Security and [facility] called. Pt did not respond to verbal de-escalation or show of support. Pt slamming door to room.

## 2023-01-27 DIAGNOSIS — F250 Schizoaffective disorder, bipolar type: Secondary | ICD-10-CM | POA: Diagnosis not present

## 2023-01-27 MED ORDER — CLONIDINE HCL 0.2 MG PO TABS
0.2000 mg | ORAL_TABLET | Freq: Two times a day (BID) | ORAL | Status: DC
  Administered 2023-01-27 – 2023-01-30 (×7): 0.2 mg via ORAL
  Filled 2023-01-27 (×10): qty 1

## 2023-01-27 MED ORDER — TRAZODONE HCL 100 MG PO TABS
100.0000 mg | ORAL_TABLET | Freq: Every evening | ORAL | Status: DC | PRN
  Administered 2023-01-31: 100 mg via ORAL
  Filled 2023-01-27 (×2): qty 1

## 2023-01-27 MED ORDER — CHLORPROMAZINE HCL 25 MG PO TABS
50.0000 mg | ORAL_TABLET | Freq: Three times a day (TID) | ORAL | Status: DC | PRN
  Administered 2023-01-29: 50 mg via ORAL
  Filled 2023-01-27: qty 2

## 2023-01-27 MED ORDER — OLANZAPINE 10 MG PO TBDP
10.0000 mg | ORAL_TABLET | Freq: Every day | ORAL | Status: DC
  Administered 2023-01-27 – 2023-01-29 (×3): 10 mg via ORAL
  Filled 2023-01-27 (×4): qty 1

## 2023-01-27 MED ORDER — LORAZEPAM 0.5 MG PO TABS
0.5000 mg | ORAL_TABLET | Freq: Three times a day (TID) | ORAL | Status: DC
  Administered 2023-01-27 – 2023-01-30 (×9): 0.5 mg via ORAL
  Filled 2023-01-27 (×9): qty 1

## 2023-01-27 MED ORDER — CHLORPROMAZINE HCL 25 MG PO TABS: 50.0000 mg | ORAL_TABLET | Freq: Three times a day (TID) | ORAL | Status: DC | PRN

## 2023-01-27 MED ORDER — OLANZAPINE 5 MG PO TBDP
5.0000 mg | ORAL_TABLET | Freq: Two times a day (BID) | ORAL | Status: DC
  Administered 2023-01-28 – 2023-01-30 (×5): 5 mg via ORAL
  Filled 2023-01-27 (×8): qty 1

## 2023-01-27 MED ORDER — CHLORPROMAZINE HCL 50 MG PO TABS
50.0000 mg | ORAL_TABLET | Freq: Three times a day (TID) | ORAL | Status: DC
  Administered 2023-01-27 – 2023-01-30 (×9): 50 mg via ORAL
  Filled 2023-01-27: qty 2
  Filled 2023-01-27 (×7): qty 1
  Filled 2023-01-27: qty 2
  Filled 2023-01-27 (×5): qty 1

## 2023-01-27 NOTE — Progress Notes (Signed)
Dar Note: Patient presents with angry affect, agitated, with mood lability at the beginning of shift.  Observed to be abusive and threatening to cause physical harm to staff.  Verbal redirection was ineffective.  PRN agitation protocol administered with minimal effect as patient woke up and continue to escalate.  Observed pacing the hallway while being intrusive and disruptive in milieu.  Noted slamming door, cussing, yelling and punch wall x 2.  Scheduled morning given orally at this time.  Routine safety checks maintained. Staff continues to offer support and encouragement as needed.  Patient is safe on the unit.

## 2023-01-27 NOTE — Progress Notes (Signed)
Patient has been in his room the majority of the evening. He was compliant with his scheduled medications. He has been calm and cooperative. He mentioned he was ready to discharge and writer informed hm that his doctor will give him an idea of when he will be discharged. Writer encouraged him to start attending groups throughout the day and wrap up group in the evenings. Support given and safety maintained with 15 min checks.

## 2023-01-27 NOTE — Group Note (Signed)
Recreation Therapy Group Note   Group Topic:Problem Solving  Group Date: 01/27/2023 Start Time: 1000 End Time: 1030 Facilitators: Jailyne Chieffo-McCall, LRT,CTRS Location: [address] Dayroom   Goal Area(s) Addresses:  Patient will effectively work with peer towards shared goal.  Patient will identify skill used to make activity successful.  Patient will identify how skills used during activity can be used to reach post d/c goals.    Group Description:  In team's, using 20 small plastic cups, patients were asked to build the tallest free standing tower possible.   Affect/Mood: N/A   Participation Level: Did not attend    Clinical Observations/Individualized Feedback:      Plan: Continue to engage patient in RT group sessions 2-3x/week.   Vincent [name], LRT,CTRS 01/27/2023 11:21 AM

## 2023-01-27 NOTE — Progress Notes (Signed)
[facility] MD Progress Note  01/27/2023 3:35 PM Vincent [name]  MRN:  [id number]  Subjective:   Patient is a 27 year old African-American male who presented to the hospital escorted by police department with increased agitation and "manic behavior" patient reportedly was irate and agitated screaming stomping his feet, he talked about needing an injection reportedly he was on Abilify Maintena 400 mg IM but has not received it recently., Patient reportedly was found on the street cursing and yelling at passerby stating to them he was pulling off the shirt to throw in the street patient reportedly made significant comments such as "I am going to fuck that [place] bitch for taking my phone, patient continues to be extremely agitated and was transferred to the inpatient floor upon arrival he continued to be very bizarre irritable aggressive pacing in the hallways sometimes rambling and very difficult to discern what he was saying.   Per nursing: overnight the unit visitation cancelled due to this pt's agitation.   Received PRN medication this morning prior to my evaluation.   I initially saw the patient on 8 AM this morning, and he was very agitated, banging doors, argumentative with staff.  When I interviewed the pt later in the morning, he was in the seclusion room, door unlocked, yelling at himself and stating that his brain hurts and is confused.  Per staff, pt asked to go into the unlocked seclusion room because he was starting to feel overwhelmed.  Patient was responding to internal stimuli.  He was paranoid.  He was irritable.  He was tangential and illogical.  He was in mental anguish.  Denied any SI or HI.  Although he is responding to internal stimuli, he denied any AH or VH. I discussed with him that we should augment some of his medication treatment, and he is in agreement.  Offered to start Depakote, but the patient stated " I cannot take that.  I can't nut when I'm on that."  We discussed  starting other mood stabilizing medication, including Thorazine, the patient is agreeable to this.  We discussed that for some period of time, he will be on 3 antipsychotics, as he received the Abilify LAI this morning, we will be receiving Zyprexa and Thorazine, and although this is not advisable, dual antipsychotic therapy has been ineffective for treatment of his psychosis thus far.   Discussed getting EKG with nursing for monitoring qtc.      Principal Problem: Schizoaffective disorder, bipolar type (HCC) Diagnosis: Principal Problem:   Schizoaffective disorder, bipolar type (HCC) Active Problems:   Cannabis use disorder, severe, dependence (HCC)  Total Time spent with patient: 15 minutes  Past Psychiatric History:  Schizoaffective disorder, [place] as outpatient with Abilify Maintena 400 mg IM LAI, last dose was ?12-29-22?   Past Medical History:  Past Medical History:  Diagnosis Date   Anxiety    Schizophrenia (HCC)     Past Surgical History:  Procedure Laterality Date   MANDIBLE SURGERY     WRIST SURGERY     laceration by glass that pt sts was closed with surgery   Family History: History reviewed. No pertinent family history. Family Psychiatric  History: See H&P Social History:  Social History   Substance and Sexual Activity  Alcohol Use Yes   Alcohol/week: 1.0 standard drink of alcohol   Types: 1 Shots of liquor per week   Comment: occasionally/ maybe once a month     Social History   Substance and Sexual Activity  Drug  Use Yes   Types: Marijuana, Amphetamines, Cocaine   Comment: 1-2 zx per month    Social History   Socioeconomic History   Marital status: Single    Spouse name: Not on file   Number of children: Not on file   Years of education: Not on file   Highest education level: Not on file  Occupational History   Not on file  Tobacco Use   Smoking status: Every Day    Packs/day: 1.00    Years: 2.00    Additional pack years: 0.00    Total pack  years: 2.00    Types: Cigarettes   Smokeless tobacco: Never  Vaping Use   Vaping Use: Never used  Substance and Sexual Activity   Alcohol use: Yes    Alcohol/week: 1.0 standard drink of alcohol    Types: 1 Shots of liquor per week    Comment: occasionally/ maybe once a month   Drug use: Yes    Types: Marijuana, Amphetamines, Cocaine    Comment: 1-2 zx per month   Sexual activity: Not Currently    Birth control/protection: None  Other Topics Concern   Not on file  Social History Narrative   Not on file   Social Determinants of Health   Financial Resource Strain: Not on file  Food Insecurity: Food Insecurity Present (01/24/2023)   Hunger Vital Sign    Worried About Running Out of Food in the Last Year: Sometimes true    Ran Out of Food in the Last Year: Sometimes true  Transportation Needs: Unmet Transportation Needs (01/24/2023)   PRAPARE - Administrator, Civil Service (Medical): Yes    Lack of Transportation (Non-Medical): Yes  Physical Activity: Not on file  Stress: Not on file  Social Connections: Not on file   Additional Social History:                           Current Medications: Current Facility-Administered Medications  Medication Dose Route Frequency Provider Last Rate Last Admin   acetaminophen (TYLENOL) tablet 650 mg  650 mg Oral Q6H PRN Dahlia Byes C, NP   650 mg at 01/25/23 0035   alum & mag hydroxide-simeth (MAALOX/MYLANTA) 200-200-20 MG/5ML suspension 30 mL  30 mL Oral Q4H PRN Dahlia Byes C, NP       ARIPiprazole ER (ABILIFY MAINTENA) injection 400 mg  400 mg Intramuscular Q28 days Memori Sammon, Harrold Donath, MD   400 mg at 01/26/23 1700   chlorproMAZINE (THORAZINE) tablet 50 mg  50 mg Oral TID PRN Phineas Inches, MD       Or   chlorproMAZINE (THORAZINE) tablet 50 mg  50 mg Oral TID PRN Thania Woodlief, Harrold Donath, MD       chlorproMAZINE (THORAZINE) tablet 50 mg  50 mg Oral Q8H Latonya Nelon, MD       cloNIDine (CATAPRES) tablet  0.2 mg  0.2 mg Oral Q12H Krimson Massmann, MD       diphenhydrAMINE (BENADRYL) capsule 50 mg  50 mg Oral TID PRN Dahlia Byes C, NP   50 mg at 01/26/23 0224   Or   diphenhydrAMINE (BENADRYL) injection 50 mg  50 mg Intramuscular TID PRN Dahlia Byes C, NP   50 mg at 01/27/23 0748   gabapentin (NEURONTIN) capsule 600 mg  600 mg Oral TID Loni Beckwith R, MD   600 mg at 01/27/23 1054   LORazepam (ATIVAN) tablet 2 mg  2 mg Oral TID PRN Welford Roche,  Hessie Dibble, NP   2 mg at 01/26/23 5409   Or   LORazepam (ATIVAN) injection 2 mg  2 mg Intramuscular TID PRN Dahlia Byes C, NP   2 mg at 01/27/23 0748   LORazepam (ATIVAN) tablet 0.5 mg  0.5 mg Oral Q8H Lochlann Mastrangelo, MD       magnesium hydroxide (MILK OF MAGNESIA) suspension 30 mL  30 mL Oral Daily PRN Welford Roche, Josephine C, NP       nicotine (NICODERM CQ - dosed in mg/24 hours) patch 14 mg  14 mg Transdermal Daily Onuoha, Chinwendu V, NP   14 mg at 01/27/23 1058   OLANZapine zydis (ZYPREXA) disintegrating tablet 10 mg  10 mg Oral QHS Tanica Gaige, Harrold Donath, MD       Melene Muller ON 01/28/2023] OLANZapine zydis (ZYPREXA) disintegrating tablet 5 mg  5 mg Oral BID Cyntha Brickman, Harrold Donath, MD       traZODone (DESYREL) tablet 100 mg  100 mg Oral QHS PRN Karine Garn, Harrold Donath, MD       traZODone (DESYREL) tablet 200 mg  200 mg Oral QHS Justine Cossin, Harrold Donath, MD   200 mg at 01/26/23 2318   valbenazine (INGREZZA) capsule 40 mg  40 mg Oral Daily Onuoha, Josephine C, NP   40 mg at 01/27/23 1058    Lab Results: No results found for this or any previous visit (from the past 48 hour(s)).  Blood Alcohol level:  Lab Results  Component Value Date   ETH <10 01/23/2023   ETH <5 10/17/2014    Metabolic Disorder Labs: Lab Results  Component Value Date   HGBA1C 5.7 (H) 08/04/2022   MPG 114 10/21/2014   Lab Results  Component Value Date   PROLACTIN 0.8 (L) 08/04/2022   Lab Results  Component Value Date   CHOL 138 08/04/2022   TRIG 70 08/04/2022   HDL 50  08/04/2022   CHOLHDL 2.8 08/04/2022   VLDL 28 10/21/2014   LDLCALC 74 08/04/2022   LDLCALC 63 10/21/2014    Physical Findings: AIMS:  , ,  ,  ,    CIWA:    COWS:     Musculoskeletal: Strength & Muscle Tone: within normal limits Gait & Station: normal Patient leans: N/A  Psychiatric Specialty Exam:  Presentation  General Appearance:  Disheveled  Eye Contact: Poor  Speech: Pressured  Speech Volume: Increased  Handedness: Right   Mood and Affect  Mood: Anxious; Labile  Affect: Full Range   Thought Process  Thought Processes: Goal Directed  Descriptions of Associations:Tangential  Orientation:Partial  Thought Content:Illogical; Paranoid Ideation  History of Schizophrenia/Schizoaffective disorder:Yes  Duration of Psychotic Symptoms:Greater than six months  Hallucinations:Hallucinations: Auditory  Ideas of Reference:Paranoia  Suicidal Thoughts:Suicidal Thoughts: No  Homicidal Thoughts:Homicidal Thoughts: No   Sensorium  Memory: Recent Poor; Immediate Poor; Remote Fair  Judgment: Impaired  Insight: Lacking   Executive Functions  Concentration: Poor  Attention Span: Poor  Recall: Good  Fund of Knowledge: Good  Language: Good   Psychomotor Activity  Psychomotor Activity: Psychomotor Activity: Restlessness   Assets  Assets: Communication Skills; Desire for Improvement; Physical Health   Sleep  Sleep: Sleep: Fair    Physical Exam: Physical Exam Vitals reviewed.  Constitutional:      Appearance: He is normal weight.  Pulmonary:     Effort: Pulmonary effort is normal.  Neurological:     Mental Status: He is alert.     Motor: No weakness.     Gait: Gait normal.    Review of Systems  Constitutional:  Negative for chills and fever.  Cardiovascular:  Negative for chest pain and palpitations.  Neurological:  Negative for dizziness, tingling, tremors and headaches.  Psychiatric/Behavioral:  Positive for  hallucinations and substance abuse. The patient is nervous/anxious and has insomnia.    Blood pressure 136/89, pulse 76, temperature 98.8 F (37.1 C), temperature source Oral, resp. rate 16, height 6' (1.829 m), weight 86.2 kg, SpO2 99 %. Body mass index is 25.77 kg/m.   Treatment Plan Summary: Daily contact with patient to assess and evaluate symptoms and progress in treatment and Medication management  ASSESSMENT:  Diagnoses / Active Problems: -Schizoaffective disorder bipolar type -GAD -Rule out TD on Ingrezza, no current TD symptoms, recently started Ingrezza  PLAN: Safety and Monitoring:  -- Involuntary admission to inpatient psychiatric unit for safety, stabilization and treatment  -- Daily contact with patient to assess and evaluate symptoms and progress in treatment  -- Patient's case to be discussed in multi-disciplinary team meeting  -- Observation Level : q15 minute checks  -- Vital signs:  q12 hours  -- Precautions: suicide, elopement, and assault  2. Psychiatric Diagnoses and Treatment:    -Change Zyprexa from 10 mg q12H -> to 01/21/09 for psychosis and mood stabilziation.  -Previously stopped Cogentin due to concern for TD -Continue and Ingrezza 40 mg once daily for TD, started as outpatient on 12/29/2022 -Increase clonidine from 0.1 mg to 0.2 mg every 12 hours for impulse control -Previously administered Abilify Maintena 400 mg LAI on 01-26-23.  -Stop Haldol as needed for agitation from 5 mg to 10 mg PRN (PO or IM)  -Start Thorazine 50 mg tid prn PO or IM for agitation (replacing haldol) -order EKG -Start thorazine 50 mg q8H scheduled for psychosis, mood stabilization, and aggression  -Decrease Ativan from 1 mg to 0.5 mg every 8 hours for anxiety -Continue trazodone 200 mg nightly for insomnia    --  The risks/benefits/side-effects/alternatives to this medication were discussed in detail with the patient and time was given for questions. The patient consents to  medication trial.    -- Metabolic profile and EKG monitoring obtained while on an atypical antipsychotic (BMI: Lipid Panel: HbgA1c: QTc:)   -- Encouraged patient to participate in unit milieu and in scheduled group therapies       3. Medical Issues Being Addressed:   Tobacco Use Disorder  -- Nicotine patch 14 mg/24 hours ordered  -- Smoking cessation encouraged  4. Discharge Planning:   -- Social work and case management to assist with discharge planning and identification of hospital follow-up needs prior to discharge  -- Estimated LOS: 5-7 days  -- Discharge Concerns: Need to establish a safety plan; Medication compliance and effectiveness  -- Discharge Goals: Return home with outpatient referrals for mental health follow-up including medication management/psychotherapy    Cristy Hilts, MD 01/27/2023, 3:35 PM  Total Time Spent in Direct Patient Care:  I personally spent 35 minutes on the unit in direct patient care. The direct patient care time included face-to-face time with the patient, reviewing the patient's chart, communicating with other professionals, and coordinating care. Greater than 50% of this time was spent in counseling or coordinating care with the patient regarding goals of hospitalization, psycho-education, and discharge planning needs.   Phineas Inches, MD Psychiatrist

## 2023-01-27 NOTE — Progress Notes (Signed)
Pt woke up at midnight, pt peered on his bed and all over the floor stating it's the evil spirit that made him sweat a lot. Pt was medicated, offered drinks and food then went back to sleep. This morning pt woke up loud, irritable, agitated and verbally abusive towards staff. Pt remained back during breakfast and think he was being punished. Pt given scheduled ativan, pt in the room at this time singing loud, will continue to monitor.

## 2023-01-27 NOTE — [facility] Counselor (Signed)
Adult Comprehensive Assessment  Patient ID: Vincent [name], male   DOB: December 21, 1995, 27 y.o.   MRN: [id number]  Information Source: Chart Review     Summary/Recommendations:   Summary and Recommendations (to be completed by the evaluator): Vincent [name] is a 27 year old male who was admitted to [facility] for for increased agitation and "manic" behavior.  Patient has been unable to participate in an assessment since being admitted for ongoing agitation and lethargy from medications.  The rest of assessment is from chart review. According to chart review patient has a history of methamphetamine, cannabis and cocaine use.  Patient has been homeless according to his mom and has not been compliant with medications.  While on the unit, nursing has noted increased agitation, paranoia and responding to stimuli.  Patient is not connected to outpatient follow up. While here, Vincent [name] can benefit from crisis stabilization, medication management, therapeutic milieu, and referrals for services.   Vincent [name] E Georgeana Oertel. 01/27/2023

## 2023-01-27 NOTE — Progress Notes (Signed)
Vincent [name] did not attend wrap up group

## 2023-01-28 LAB — LIPID PANEL
Cholesterol: 137 mg/dL (ref 0–200)
HDL: 48 mg/dL (ref >40)
LDL Cholesterol: 48 mg/dL (ref 0–99)
Total CHOL/HDL Ratio: 2.9 RATIO
Triglycerides: 204 mg/dL — ABNORMAL HIGH (ref <150)
VLDL: 41 mg/dL — ABNORMAL HIGH (ref 0–40)

## 2023-01-28 LAB — HEPATIC FUNCTION PANEL
ALT: 48 U/L — ABNORMAL HIGH (ref 0–44)
AST: 54 U/L — ABNORMAL HIGH (ref 15–41)
Albumin: 3.8 g/dL (ref 3.5–5.0)
Alkaline Phosphatase: 77 U/L (ref 38–126)
Bilirubin, Direct: <0.1 mg/dL (ref 0.0–0.2)
Total Bilirubin: 0.5 mg/dL (ref 0.3–1.2)
Total Protein: 7.1 g/dL (ref 6.5–8.1)

## 2023-01-28 LAB — HEMOGLOBIN A1C
Hgb A1c MFr Bld: 5.7 % — ABNORMAL HIGH (ref 4.8–5.6)
Mean Plasma Glucose: 116.89 mg/dL

## 2023-01-28 NOTE — Group Note (Signed)
Date:  01/28/2023 Time:  8:36 PM  Group Topic/Focus:  Wrap-Up Group:   The focus of this group is to help patients review their daily goal of treatment and discuss progress on daily workbooks.    Participation Level:  Did Not Attend  Scot Dock 01/28/2023, 8:36 PM

## 2023-01-28 NOTE — Progress Notes (Signed)
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 While at [facility] patient remained irate and agitated screaming stomping his feet; he mentioned needing an injection reportedly he was on Abilify Maintena 400 mg IM but has not received it recently. He receives outpatient services at the [facility] and agreed to have his injection; however due to his continued level of agitation he was transferred to [facility] for further stabilization. While at ED pt required multiple forced medications for continued agitation and paranoia. He was transferred to the inpatient floor 01/24/23  where upon arrival he continued to be very bizarre irritable aggressive pacing in the hallways sometimes rambling and very difficult to discern what he was saying.   24 hour chart review: Staff report patient was labile, making verbal threats to staff earlier in the day, he was later noted to spend the majority of the evening in his room calm and cooperative. He has been medication compliant; PRN Acetaminophen 650 mg given 0832 for mild pain. Vital signs remain WNL. Patient slept overnight 7.75 hours.   Assessment: Patient observe sitting in the dayroom on the unit watching television. Assessment completed in his room on the unit where he presents alert and oriented to person, place; partial to time and situation. His mood is dysphoric with some lability; congruent affect. He appears restlessness at times. Casual appearance. Pressured, slightly tangential speech. He reports  feeling 'moderate. I'm happy'. States  reason for admission as 'I was screaming and stuff. I felt like they don't look at the truth. I was mad a detective was holding my phone. They were playing tampering games'. He continues to present with delusional, illogical thought content; denies any memory of cursing out staff or reported behaviors from day prior. He endorses 'good' appetite and sleep. He reports his medications as Abilify Maintenna, Gabapentin and endorses compliance.  Reports he 'only hears voices when my medication is wearing off'. He denies any SI/HI/AVH. Inquires several times about discharge stating 'I'm ready'. Care discussed with attending MD; treatment plan noted below.  He remains on 3 antipsychotics, as he received the Abilify LAI, and is receiving Zyprexa and Thorazine. Per attending MD, 'although this is not advisable, dual antipsychotic therapy has been ineffective for treatment of his psychosis thus far'. Nursing to continue to monitor QTC.   Principal Problem: Schizoaffective disorder, bipolar type (HCC) Diagnosis: Principal Problem:   Schizoaffective disorder, bipolar type (HCC) Active Problems:   Cannabis use disorder, severe, dependence (HCC)  Total Time spent with patient: 35 minutes  Past Psychiatric History:  Schizoaffective disorder, [place] as outpatient with Abilify Maintena 400 mg IM LAI, last dose was 01/23/23 [facility]  Past Medical History:  Past Medical History:  Diagnosis Date   Anxiety    Schizophrenia (HCC)     Past Surgical History:  Procedure Laterality Date   MANDIBLE SURGERY     WRIST SURGERY     laceration by glass that pt sts was closed with surgery   Family History: History reviewed. No pertinent family history. Family Psychiatric  History: See H&P Social History:  Social History  Substance and Sexual Activity  Alcohol Use Yes   Alcohol/week: 1.0 standard drink of alcohol   Types: 1 Shots of liquor per week   Comment: occasionally/ maybe  once a month     Social History   Substance and Sexual Activity  Drug Use Yes   Types: Marijuana, Amphetamines, Cocaine   Comment: 1-2 zx per month    Social History   Socioeconomic History   Marital status: Single    Spouse name: Not on file   Number of children: Not on file   Years of education: Not on file   Highest education level: Not on file  Occupational History   Not on file  Tobacco Use   Smoking status: Every Day    Packs/day: 1.00    Years: 2.00    Additional pack years: 0.00    Total pack years: 2.00    Types: Cigarettes   Smokeless tobacco: Never  Vaping Use   Vaping Use: Never used  Substance and Sexual Activity   Alcohol use: Yes    Alcohol/week: 1.0 standard drink of alcohol    Types: 1 Shots of liquor per week    Comment: occasionally/ maybe once a month   Drug use: Yes    Types: Marijuana, Amphetamines, Cocaine    Comment: 1-2 zx per month   Sexual activity: Not Currently    Birth control/protection: None  Other Topics Concern   Not on file  Social History Narrative   Not on file   Social Determinants of Health   Financial Resource Strain: Not on file  Food Insecurity: Food Insecurity Present (01/24/2023)   Hunger Vital Sign    Worried About Running Out of Food in the Last Year: Sometimes true    Ran Out of Food in the Last Year: Sometimes true  Transportation Needs: Unmet Transportation Needs (01/24/2023)   PRAPARE - Administrator, Civil Service (Medical): Yes    Lack of Transportation (Non-Medical): Yes  Physical Activity: Not on file  Stress: Not on file  Social Connections: Not on file   Additional Social History:  [facility] admissions dating back to 2015-16 for schizophrenia  Current Medications: Current Facility-Administered Medications  Medication Dose Route Frequency Provider Last Rate Last Admin   acetaminophen (TYLENOL) tablet 650 mg  650 mg Oral Q6H PRN Dahlia Byes C, NP   650 mg at 01/28/23 0832   alum & mag  hydroxide-simeth (MAALOX/MYLANTA) 200-200-20 MG/5ML suspension 30 mL  30 mL Oral Q4H PRN Dahlia Byes C, NP       ARIPiprazole ER (ABILIFY MAINTENA) injection 400 mg  400 mg Intramuscular Q28 days Massengill, Harrold Donath, MD   400 mg at 01/26/23 1700   chlorproMAZINE (THORAZINE) tablet 50 mg  50 mg Oral TID PRN Massengill, Harrold Donath, MD       Or   chlorproMAZINE (THORAZINE) tablet 50 mg  50 mg Oral TID PRN Massengill, Harrold Donath, MD       chlorproMAZINE (THORAZINE) tablet 50 mg  50 mg Oral Q8H Massengill, Nathan, MD   50 mg at 01/28/23 0619   cloNIDine (CATAPRES) tablet 0.2 mg  0.2 mg Oral Q12H Massengill, Nathan, MD   0.2 mg at 01/28/23 0739   diphenhydrAMINE (BENADRYL) capsule 50 mg  50 mg Oral TID PRN Dahlia Byes C, NP   50 mg at 01/26/23 0224   Or   diphenhydrAMINE (BENADRYL) injection 50 mg  50 mg Intramuscular TID PRN Dahlia Byes C, NP   50 mg at 01/27/23 [id number]  gabapentin (NEURONTIN) capsule 600 mg  600 mg Oral TID Loni Beckwith R, MD   600 mg at 01/28/23 0743   LORazepam (ATIVAN) tablet 2 mg  2 mg Oral TID PRN Dahlia Byes C, NP   2 mg at 01/26/23 1610   Or   LORazepam (ATIVAN) injection 2 mg  2 mg Intramuscular TID PRN Dahlia Byes C, NP   2 mg at 01/27/23 0748   LORazepam (ATIVAN) tablet 0.5 mg  0.5 mg Oral Q8H Massengill, Nathan, MD   0.5 mg at 01/28/23 9604   magnesium hydroxide (MILK OF MAGNESIA) suspension 30 mL  30 mL Oral Daily PRN Dahlia Byes C, NP       nicotine (NICODERM CQ - dosed in mg/24 hours) patch 14 mg  14 mg Transdermal Daily Onuoha, Chinwendu V, NP   14 mg at 01/28/23 0743   OLANZapine zydis (ZYPREXA) disintegrating tablet 10 mg  10 mg Oral QHS Massengill, Harrold Donath, MD   10 mg at 01/27/23 2134   OLANZapine zydis (ZYPREXA) disintegrating tablet 5 mg  5 mg Oral BID Massengill, Harrold Donath, MD   5 mg at 01/28/23 5409   traZODone (DESYREL) tablet 100 mg  100 mg Oral QHS PRN Massengill, Harrold Donath, MD       traZODone (DESYREL) tablet 200 mg  200 mg Oral QHS  Massengill, Harrold Donath, MD   200 mg at 01/27/23 2133   valbenazine (INGREZZA) capsule 40 mg  40 mg Oral Daily Dahlia Byes C, NP   40 mg at 01/28/23 8119    Lab Results: No results found for this or any previous visit (from the past 48 hour(s)).  Blood Alcohol level:  Lab Results  Component Value Date   ETH <10 01/23/2023   ETH <5 10/17/2014    Metabolic Disorder Labs: Lab Results  Component Value Date   HGBA1C 5.7 (H) 08/04/2022   MPG 114 10/21/2014   Lab Results  Component Value Date   PROLACTIN 0.8 (L) 08/04/2022   Lab Results  Component Value Date   CHOL 138 08/04/2022   TRIG 70 08/04/2022   HDL 50 08/04/2022   CHOLHDL 2.8 08/04/2022   VLDL 28 10/21/2014   LDLCALC 74 08/04/2022   LDLCALC 63 10/21/2014    Physical Findings: AIMS:  , ,  ,  ,    CIWA:    COWS:     Musculoskeletal: Strength & Muscle Tone: within normal limits Gait & Station: normal Patient leans: N/A  Psychiatric Specialty Exam:  Presentation  General Appearance:  Casual  Eye Contact: Fair  Speech: Pressured  Speech Volume: Normal  Handedness: Right   Mood and Affect  Mood: Labile  Affect: Blunt  Thought Process  Thought Processes: Goal Directed  Descriptions of Associations:Tangential  Orientation:Partial  Thought Content:Illogical; Paranoid Ideation  History of Schizophrenia/Schizoaffective disorder:Yes  Duration of Psychotic Symptoms:Greater than six months  Hallucinations:Hallucinations: None  Ideas of Reference:Percusatory  Suicidal Thoughts:Suicidal Thoughts: No  Homicidal Thoughts:Homicidal Thoughts: No  Sensorium  Memory: Immediate Fair; Recent Fair  Judgment: Impaired  Insight: Shallow  Executive Functions  Concentration: Fair  Attention Span: Poor  Recall: [employer] of Knowledge: Fair  Language: Fair  Psychomotor Activity  Psychomotor Activity: Psychomotor Activity: Restlessness  Assets  Assets: Investment banker, corporate; Physical Health; Resilience  Sleep  Sleep: Sleep: Good  Physical Exam: Physical Exam Vitals reviewed.  Constitutional:      Appearance: He is normal weight.  Pulmonary:     Effort: Pulmonary effort is normal.  Neurological:  Mental Status: He is alert.     Motor: No weakness.     Gait: Gait normal.  Psychiatric:        Attention and Perception: He does not perceive auditory or visual hallucinations.        Mood and Affect: Affect is labile.        Speech: Speech is rapid and pressured and tangential.        Behavior: Behavior is cooperative.        Thought Content: Thought content is delusional. Thought content is not paranoid. Thought content does not include homicidal or suicidal ideation. Thought content does not include homicidal or suicidal plan.        Judgment: Judgment is impulsive.    Review of Systems  Constitutional:  Negative for chills and fever.  Cardiovascular:  Negative for chest pain and palpitations.  Neurological:  Negative for dizziness, tingling, tremors and headaches.  Psychiatric/Behavioral:  Positive for substance abuse. The patient is nervous/anxious.    Blood pressure 137/72, pulse 79, temperature 97.8 F (36.6 C), temperature source Oral, resp. rate 20, height 6' (1.829 m), weight 86.2 kg, SpO2 100 %. Body mass index is 25.77 kg/m.   Treatment Plan Summary: Daily contact with patient to assess and evaluate symptoms and progress in treatment and Medication management  ASSESSMENT:  Diagnoses / Active Problems: -Schizoaffective disorder bipolar type -GAD -Rule out TD on Ingrezza, no current TD symptoms, recently started Ingrezza  PLAN: Safety and Monitoring:  -- Involuntary admission to inpatient psychiatric unit for safety, stabilization and treatment  -- Daily contact with patient to assess and evaluate symptoms and progress in treatment  -- Patient's case to be discussed in multi-disciplinary team meeting  -- Observation Level  : q15 minute checks  -- Vital signs:  q12 hours  -- Precautions: suicide, elopement, and assault  2. Psychiatric Diagnoses and Treatment:  Recent Medication Trials:  -Previously stopped Cogentin due to concern for TD -Clonidine 0.1 mg increased 05/10 -Stop Haldol as needed for agitation from 5 mg to 10 mg PRN (PO or IM)   -Previously received Abilify Maintenna 400 mg LAI on 01-26-23.   Continue:   - Zyprexa from 10 mg q12H -> to 01/21/09 for psychosis and mood stabilziation.  - Ingrezza 40 mg once daily for TD, started as outpatient on 12/29/2022 -Clonidine 0.2 mg every 12 hours for impulse control - Thorazine 50 mg q8H scheduled for psychosis, mood stabilization, and aggression  - Ativan 0.5 mg every 8 hours for anxiety Trazodone 200 mg nightly for insomnia   Agitation Orders: Thorazine 50 mg PO, Benadryl 50 mg PO/IM, Lorazepam 2 mg PO/IM TID prn  for agitation   PRNs:  Acetaminophen 650 mg PO q6hrs PRN mild pain Maalox 30 mL PO q4hrs PRN indigestion Milk of Magnesia 30 mL PO daily PRN mild constipation Trazodone 100 mg PR HS PRN sleep   --  The risks/benefits/side-effects/alternatives to this medication were discussed in detail with the patient and time was given for questions. The patient consents to medication trial.   -- Metabolic profile and EKG monitoring obtained while on an atypical antipsychotic (BMI: Lipid Panel: HbgA1c: QTc:)   -- Encouraged patient to participate in unit milieu and in scheduled group therapies       3. Medical Issues Being Addressed:       Tobacco Use Disorder     -- Nicotine patch 14 mg/24 hours ordered  -- Smoking cessation encouraged  4. Discharge Planning:   -- Social  work and case management to assist with discharge planning and identification of hospital follow-up needs prior to discharge  -- Estimated LOS: 5-7 days  -- Discharge Concerns: Need to establish a safety plan; Medication compliance and effectiveness  -- Discharge Goals: Return  home with outpatient referrals for mental health follow-up including medication management/psychotherapy    Loletta Parish, NP 01/28/2023, 11:18 AM  Patient ID: [name], male   DOB: 1995/11/02, 27 y.o.   MRN: [id number]

## 2023-01-28 NOTE — Progress Notes (Signed)
Adult Psychoeducational Group Note  Date:  01/28/2023 Time:  11:15 AM  Group Topic/Focus:  Goals Group:   The focus of this group is to help patients establish daily goals to achieve during treatment and discuss how the patient can incorporate goal setting into their daily lives to aide in recovery.  Participation Level:  Active  Participation Quality:  Appropriate  Affect:  Appropriate  Cognitive:  Appropriate  Insight: Appropriate  Engagement in Group:  Engaged  Modes of Intervention:  Discussion  Additional Comments: The patient engaged in group.  Octavio Manns 01/28/2023, 11:15 AM

## 2023-01-28 NOTE — Progress Notes (Signed)
   01/28/23 1300  Psych Admission Type (Psych Patients Only)  Admission Status Involuntary  Psychosocial Assessment  Patient Complaints None  Eye Contact Fair  Facial Expression Animated  Affect Appropriate to circumstance  Speech Logical/coherent  Interaction Assertive  Motor Activity Slow  Appearance/Hygiene Improved  Behavior Characteristics Appropriate to situation  Mood Preoccupied  Thought Process  Coherency Circumstantial  Content Preoccupation  Delusions None reported or observed  Perception WDL  Hallucination None reported or observed  Judgment Limited  Confusion None  Danger to Self  Current suicidal ideation? Denies  Danger to Others  Danger to Others None reported or observed

## 2023-01-28 NOTE — Progress Notes (Addendum)
Patient has been isolative to his room more tonight. He reports going outside today and shot some basketball and he is still tired from that and his medicines. He was compliant with his medications. He reports that he is working on trying to discharge soon so he is going to do better. Writer praised him for his change in behavior. Safety maintained wit 15 min checks  01/28/23 2045  Psych Admission Type (Psych Patients Only)  Admission Status Involuntary  Psychosocial Assessment  Patient Complaints None  Eye Contact Fair  Facial Expression Flat  Affect Appropriate to circumstance;Flat  Speech Logical/coherent;Soft  Interaction Isolative  Motor Activity Slow  Appearance/Hygiene Improved  Behavior Characteristics Cooperative;Appropriate to situation  Mood Pleasant  Thought Process  Coherency Circumstantial  Content WDL  Delusions None reported or observed  Perception WDL  Hallucination None reported or observed  Judgment Poor  Confusion None  Danger to Self  Current suicidal ideation? Denies

## 2023-01-28 NOTE — Progress Notes (Signed)
   01/28/23 0615  15 Minute Checks  Location Hallway  Visual Appearance Calm  Behavior Composed  Sleep (Behavioral Health Patients Only)  Calculate sleep? (Click Yes once per 24 hr at 0600 safety check) Yes  Documented sleep last 24 hours 7.75

## 2023-01-29 LAB — HEPATITIS PANEL, ACUTE
HCV Ab: NONREACTIVE
Hep A IgM: NONREACTIVE
Hep B C IgM: NONREACTIVE
Hepatitis B Surface Ag: NONREACTIVE

## 2023-01-29 NOTE — Progress Notes (Signed)
Pt in day room. Pt presents with labile affect. Pt denies SI/HI/AVH. Pt reports planning to return to [facility] upon discharge. No aggression noted this shift. Pt cooperative and accepts redirection when needed. Q 15 minute checks ongoing for safety.

## 2023-01-29 NOTE — Progress Notes (Signed)
Patient has been asleep since shift change. He came and requested his medication once he woke up. He was compliant with his medications, requested snacks and clothes to be washed and returned to his room to rest. Support given and safety maintained with 15 min checks.    01/29/23 2230  Psych Admission Type (Psych Patients Only)  Admission Status Involuntary  Psychosocial Assessment  Patient Complaints None  Eye Contact Fair  Facial Expression Flat  Affect Appropriate to circumstance;Flat  Speech Logical/coherent;Soft  Interaction Isolative  Motor Activity Slow  Appearance/Hygiene Improved  Behavior Characteristics Cooperative;Calm  Mood Pleasant  Thought Process  Coherency Circumstantial  Content WDL  Delusions None reported or observed  Perception WDL  Hallucination None reported or observed  Judgment Poor  Confusion None  Danger to Self  Current suicidal ideation? Denies

## 2023-01-29 NOTE — Progress Notes (Signed)
Patient became irritable and agitated cursing at male mht for doing safety checks. Writer explained to him that safety checks have to be done and offered him medication to help calm him down and hopefully return to sleep. He explained to Clinical research associate as I was pulling the meds from [facility] that he has a lot of personal shit on his mind that bothers him sometimes but he didn't want to share with Clinical research associate and he apologized for cursing at her. He took the medication and before returning to his room he stopped and apologized to male mht. Will continue to monitor with 15 min checks.

## 2023-01-29 NOTE — Progress Notes (Signed)
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 While at [facility] patient remained irate and agitated screaming stomping his feet; he mentioned needing an injection reportedly he was on Abilify Maintena 400 mg IM but has not received it recently. He receives outpatient services at the [facility] and agreed to have his injection; however due to his continued level of agitation he was transferred to [facility] for further stabilization. While at ED pt required multiple forced medications for continued agitation and paranoia. He was transferred to the inpatient floor 01/24/23  where upon arrival he continued to be very bizarre irritable aggressive pacing in the hallways sometimes rambling and very difficult to discern what he was saying.   24 hour chart review: Staff report patient was visible throughout the shift, calm and cooperative. Medication compliant; PRN Diphenhydramine 50 mg, Lorazepam 2 mg given 0034 given for agitation. Vital signs remain WNL. Staff report patient slept overnight without any issues.   Assessment: Patient observe sitting in the dayroom on the unit watching television. Assessment completed in his room on the unit where he presents casually. Alert and oriented to person, place; partial to time and situation. His mood initially was cooperative but became irritable and labile as assessment progressed; congruent affect. He appears restlessness. Speech remains pressured and slightly tangential. He reports feeling 'okay' and  immediately begins to mentioning discharge. He becomes increasing agitated when provider attempts to explain there were no plans for discharge today where he begins saying 'yall say one thing and do another'. Insight remains minimal into his reason for admission where he minimizes his substance use and feels police 'were overreacting and lying'. He continues to endorse 'good' appetite and sleep. He denies any SI/HI/AVH; assessment stopped due to visible increased agitation. Of note after assessment patient closed his door and could be heard yelling, however no direct aggression or threats observed. He responded to verbal redirection, no PRNs required. Staff report patient currently in his room laying down at this time. Care discussed with attending MD; treatment plan noted below.  He remains on 3 antipsychotics, as he received the Abilify LAI, and is receiving Zyprexa and Thorazine. Per attending MD, 'although this is not advisable, dual antipsychotic therapy has been ineffective for treatment of his psychosis thus far'. Nursing to continue to monitor QTC.   Principal Problem: Schizoaffective disorder, bipolar type (HCC) Diagnosis: Principal Problem:   Schizoaffective disorder, bipolar type (HCC) Active Problems:   Cannabis use disorder, severe, dependence (HCC)  Total Time spent with patient: 35 minutes  Past Psychiatric History:  Schizoaffective disorder, [place] as outpatient with Abilify Maintena 400 mg IM LAI, last dose was 01/23/23 [facility]  Past Medical History:  Past Medical History:  Diagnosis Date   Anxiety    Schizophrenia (HCC)     Past Surgical History:  Procedure Laterality Date   MANDIBLE SURGERY     WRIST SURGERY     laceration by glass that pt sts was closed with surgery   Family History: History reviewed. No pertinent family history. Family Psychiatric  History: See H&P Social History:  Social  History   Substance and Sexual Activity  Alcohol Use Yes   Alcohol/week: 1.0  standard drink of alcohol   Types: 1 Shots of liquor per week   Comment: occasionally/ maybe once a month     Social History   Substance and Sexual Activity  Drug Use Yes   Types: Marijuana, Amphetamines, Cocaine   Comment: 1-2 zx per month    Social History   Socioeconomic History   Marital status: Single    Spouse name: Not on file   Number of children: Not on file   Years of education: Not on file   Highest education level: Not on file  Occupational History   Not on file  Tobacco Use   Smoking status: Every Day    Packs/day: 1.00    Years: 2.00    Additional pack years: 0.00    Total pack years: 2.00    Types: Cigarettes   Smokeless tobacco: Never  Vaping Use   Vaping Use: Never used  Substance and Sexual Activity   Alcohol use: Yes    Alcohol/week: 1.0 standard drink of alcohol    Types: 1 Shots of liquor per week    Comment: occasionally/ maybe once a month   Drug use: Yes    Types: Marijuana, Amphetamines, Cocaine    Comment: 1-2 zx per month   Sexual activity: Not Currently    Birth control/protection: None  Other Topics Concern   Not on file  Social History Narrative   Not on file   Social Determinants of Health   Financial Resource Strain: Not on file  Food Insecurity: Food Insecurity Present (01/24/2023)   Hunger Vital Sign    Worried About Running Out of Food in the Last Year: Sometimes true    Ran Out of Food in the Last Year: Sometimes true  Transportation Needs: Unmet Transportation Needs (01/24/2023)   PRAPARE - Administrator, Civil Service (Medical): Yes    Lack of Transportation (Non-Medical): Yes  Physical Activity: Not on file  Stress: Not on file  Social Connections: Not on file   Additional Social History:  [facility] admissions dating back to 2015-16 for schizophrenia  Current Medications: Current Facility-Administered Medications  Medication Dose Route Frequency Provider Last Rate Last Admin   acetaminophen (TYLENOL) tablet  650 mg  650 mg Oral Q6H PRN Dahlia Byes C, NP   650 mg at 01/28/23 0832   alum & mag hydroxide-simeth (MAALOX/MYLANTA) 200-200-20 MG/5ML suspension 30 mL  30 mL Oral Q4H PRN Dahlia Byes C, NP       ARIPiprazole ER (ABILIFY MAINTENA) injection 400 mg  400 mg Intramuscular Q28 days Massengill, Harrold Donath, MD   400 mg at 01/26/23 1700   chlorproMAZINE (THORAZINE) tablet 50 mg  50 mg Oral TID PRN Massengill, Harrold Donath, MD       Or   chlorproMAZINE (THORAZINE) tablet 50 mg  50 mg Oral TID PRN Massengill, Harrold Donath, MD       chlorproMAZINE (THORAZINE) tablet 50 mg  50 mg Oral Q8H Massengill, Nathan, MD   50 mg at 01/29/23 1610   cloNIDine (CATAPRES) tablet 0.2 mg  0.2 mg Oral Q12H Massengill, Nathan, MD   0.2 mg at 01/29/23 0755   diphenhydrAMINE (BENADRYL) capsule 50 mg  50 mg Oral TID PRN Dahlia Byes C, NP   50 mg at 01/29/23 0034   Or   diphenhydrAMINE (BENADRYL) injection 50 mg  50 mg Intramuscular TID PRN Earney Navy, NP   50 mg  at 01/27/23 0748   gabapentin (NEURONTIN) capsule 600 mg  600 mg Oral TID Loni Beckwith R, MD   600 mg at 01/29/23 0754   LORazepam (ATIVAN) tablet 2 mg  2 mg Oral TID PRN Dahlia Byes C, NP   2 mg at 01/29/23 1610   Or   LORazepam (ATIVAN) injection 2 mg  2 mg Intramuscular TID PRN Dahlia Byes C, NP   2 mg at 01/27/23 0748   LORazepam (ATIVAN) tablet 0.5 mg  0.5 mg Oral Q8H Massengill, Nathan, MD   0.5 mg at 01/29/23 9604   magnesium hydroxide (MILK OF MAGNESIA) suspension 30 mL  30 mL Oral Daily PRN Dahlia Byes C, NP       nicotine (NICODERM CQ - dosed in mg/24 hours) patch 14 mg  14 mg Transdermal Daily Onuoha, Chinwendu V, NP   14 mg at 01/28/23 0743   OLANZapine zydis (ZYPREXA) disintegrating tablet 10 mg  10 mg Oral QHS Massengill, Harrold Donath, MD   10 mg at 01/28/23 2018   OLANZapine zydis (ZYPREXA) disintegrating tablet 5 mg  5 mg Oral BID Massengill, Harrold Donath, MD   5 mg at 01/29/23 0754   traZODone (DESYREL) tablet 100 mg  100 mg Oral  QHS PRN Massengill, Harrold Donath, MD       traZODone (DESYREL) tablet 200 mg  200 mg Oral QHS Massengill, Harrold Donath, MD   200 mg at 01/28/23 2018   valbenazine (INGREZZA) capsule 40 mg  40 mg Oral Daily Dahlia Byes C, NP   40 mg at 01/29/23 5409    Lab Results:  Results for orders placed or performed during the hospital encounter of 01/24/23 (from the past 48 hour(s))  Hepatitis panel, acute     Status: None   Collection Time: 01/28/23  6:43 PM  Result Value Ref Range   Hepatitis B Surface Ag NON REACTIVE NON REACTIVE   HCV Ab NON REACTIVE NON REACTIVE    Comment: (NOTE) Nonreactive HCV antibody screen is consistent with no HCV infections,  unless recent infection is suspected or other evidence exists to indicate HCV infection.     Hep A IgM NON REACTIVE NON REACTIVE   Hep B C IgM NON REACTIVE NON REACTIVE    Comment: Performed at [facility], [address] [place], [place] [address]  Lipid panel     Status: Abnormal   Collection Time: 01/28/23  6:43 PM  Result Value Ref Range   Cholesterol 137 0 - 200 mg/dL   Triglycerides 478 (H) <150 mg/dL   HDL 48 >29 mg/dL   Total CHOL/HDL Ratio 2.9 RATIO   VLDL 41 (H) 0 - 40 mg/dL   LDL Cholesterol 48 0 - 99 mg/dL    Comment:        Total Cholesterol/HDL:CHD Risk Coronary Heart Disease Risk Table                     Men   Women  1/2 Average Risk   3.4   3.3  Average Risk       5.0   4.4  2 X Average Risk   9.6   7.1  3 X Average Risk  23.4   11.0        Use the calculated Patient Ratio above and the CHD Risk Table to determine the patient's CHD Risk.        ATP III CLASSIFICATION (LDL):  <100     mg/dL   Optimal  562-130  mg/dL   Near or Above                    Optimal  130-159  mg/dL   Borderline  161-096  mg/dL   High  >045     mg/dL   Very High Performed at [facility], [address] [place], [place] [address]   Hemoglobin A1c     Status: Abnormal   Collection Time: 01/28/23  6:43 PM  Result  Value Ref Range   Hgb A1c MFr Bld 5.7 (H) 4.8 - 5.6 %    Comment: (NOTE) Pre diabetes:          5.7%-6.4%  Diabetes:              >6.4%  Glycemic control for   <7.0% adults with diabetes    Mean Plasma Glucose 116.89 mg/dL    Comment: Performed at [facility], [address], [place], [place] [address]  Hepatic function panel     Status: Abnormal   Collection Time: 01/28/23  6:43 PM  Result Value Ref Range   Total Protein 7.1 6.5 - 8.1 g/dL   Albumin 3.8 3.5 - 5.0 g/dL   AST 54 (H) 15 - 41 U/L   ALT 48 (H) 0 - 44 U/L   Alkaline Phosphatase 77 38 - 126 U/L   Total Bilirubin 0.5 0.3 - 1.2 mg/dL   Bilirubin, Direct <8.2 0.0 - 0.2 mg/dL   Indirect Bilirubin NOT CALCULATED 0.3 - 0.9 mg/dL    Comment: Performed at [facility], [address], [place], [place] [address]    Blood Alcohol level:  Lab Results  Component Value Date   [facility] <10 01/23/2023   ETH <5 10/17/2014    Metabolic Disorder Labs: Lab Results  Component Value Date   HGBA1C 5.7 (H) 01/28/2023   MPG 116.89 01/28/2023   MPG 114 10/21/2014   Lab Results  Component Value Date   PROLACTIN 0.8 (L) 08/04/2022   Lab Results  Component Value Date   CHOL 137 01/28/2023   TRIG 204 (H) 01/28/2023   HDL 48 01/28/2023   CHOLHDL 2.9 01/28/2023   VLDL 41 (H) 01/28/2023   LDLCALC 48 01/28/2023   LDLCALC 74 08/04/2022    Physical Findings: AIMS:  , ,  ,  ,    CIWA:    COWS:     Musculoskeletal: Strength & Muscle Tone: within normal limits Gait & Station: normal Patient leans: N/A  Psychiatric Specialty Exam:  Presentation  General Appearance:  Casual  Eye Contact: Fair  Speech: Clear and Coherent; Pressured  Speech Volume: Normal  Handedness: Right   Mood and Affect  Mood: Labile; Irritable  Affect: Blunt; Labile  Thought Process  Thought Processes: Goal Directed  Descriptions of Associations:Tangential  Orientation:Partial  Thought  Content:Perseveration  History of Schizophrenia/Schizoaffective disorder:Yes  Duration of Psychotic Symptoms:Greater than six months  Hallucinations:Hallucinations: None  Ideas of Reference:Percusatory  Suicidal Thoughts:Suicidal Thoughts: No  Homicidal Thoughts:Homicidal Thoughts: No  Sensorium  Memory: Immediate Fair; Recent Fair  Judgment: Impaired  Insight: Shallow  Executive Functions  Concentration: Fair  Attention Span: Poor  Recall: [employer] of Knowledge: Fair  Language: Fair  Psychomotor Activity  Psychomotor Activity: Psychomotor Activity: Restlessness  Assets  Assets: Manufacturing systems engineer; Physical Health; Resilience  Sleep  Sleep: Sleep: Good  Physical Exam: Physical Exam Vitals reviewed.  Constitutional:      Appearance: He is normal weight.  Pulmonary:  Effort: Pulmonary effort is normal.  Neurological:     Mental Status: He is alert.     Motor: No weakness.     Gait: Gait normal.  Psychiatric:        Attention and Perception: He does not perceive auditory or visual hallucinations.        Mood and Affect: Affect is labile, blunt and angry.        Speech: Speech is rapid and pressured and tangential.        Behavior: Behavior is cooperative.        Thought Content: Thought content is delusional. Thought content is not paranoid. Thought content does not include homicidal or suicidal ideation. Thought content does not include homicidal or suicidal plan.        Judgment: Judgment is impulsive and inappropriate.    Review of Systems  Constitutional:  Negative for chills and fever.  Cardiovascular:  Negative for chest pain and palpitations.  Neurological:  Negative for dizziness, tingling, tremors and headaches.  Psychiatric/Behavioral:  Positive for substance abuse. The patient is nervous/anxious.    Blood pressure (!) 150/85, pulse 65, temperature 97.7 F (36.5 C), temperature source Oral, resp. rate 20, height 6' (1.829  m), weight 86.2 kg, SpO2 100 %. Body mass index is 25.77 kg/m.   Treatment Plan Summary: Daily contact with patient to assess and evaluate symptoms and progress in treatment and Medication management  ASSESSMENT:  Diagnoses / Active Problems: -Schizoaffective disorder bipolar type -GAD -Rule out TD on Ingrezza, no current TD symptoms, recently started Ingrezza  PLAN: Safety and Monitoring:  -- Involuntary admission to inpatient psychiatric unit for safety, stabilization and treatment  -- Daily contact with patient to assess and evaluate symptoms and progress in treatment  -- Patient's case to be discussed in multi-disciplinary team meeting  -- Observation Level : q15 minute checks  -- Vital signs:  q12 hours  -- Precautions: suicide, elopement, and assault  2. Psychiatric Diagnoses and Treatment:  Recent Medication Trials:  -Previously stopped Cogentin due to concern for TD -Clonidine 0.1 mg increased 05/10 -Stop Haldol as needed for agitation from 5 mg to 10 mg PRN (PO or IM)   -Previously received Abilify Maintenna 400 mg LAI on 01-26-23.   Continue:   - Zyprexa from 10 mg q12H -> to 01/21/09 for psychosis and mood stabilziation.  - Ingrezza 40 mg once daily for TD, started as outpatient on 12/29/2022 -Clonidine 0.2 mg every 12 hours for impulse control - Thorazine 50 mg q8H scheduled for psychosis, mood stabilization, and aggression  - Ativan 0.5 mg every 8 hours for anxiety Trazodone 200 mg nightly for insomnia   Agitation Orders: Thorazine 50 mg PO, Benadryl 50 mg PO/IM, Lorazepam 2 mg PO/IM TID prn  for agitation   PRNs:  Acetaminophen 650 mg PO q6hrs PRN mild pain Maalox 30 mL PO q4hrs PRN indigestion Milk of Magnesia 30 mL PO daily PRN mild constipation Trazodone 100 mg PR HS PRN sleep   --  The risks/benefits/side-effects/alternatives to this medication were discussed in detail with the patient and time was given for questions. The patient consents to medication  trial.   -- Metabolic profile and EKG monitoring obtained while on an atypical antipsychotic (BMI: Lipid Panel: HbgA1c: QTc:)   -- Encouraged patient to participate in unit milieu and in scheduled group therapies       3. Medical Issues Being Addressed:       Tobacco Use Disorder     -- Nicotine  patch 14 mg/24 hours ordered  -- Smoking cessation encouraged  4. Discharge Planning:   -- Social work and case management to assist with discharge planning and identification of hospital follow-up needs prior to discharge  -- Estimated LOS: 5-7 days  -- Discharge Concerns: Need to establish a safety plan; Medication compliance and effectiveness  -- Discharge Goals: Return home with outpatient referrals for mental health follow-up including medication management/psychotherapy    Loletta Parish, NP 01/29/2023, 8:36 AM  Patient ID: [name], male   DOB: [birth year], 27 y.o.   MRN: [id number]

## 2023-01-29 NOTE — Progress Notes (Signed)
Pt cooperative and compliant most of the day. Some aggressive speech and cussing and some hypersexual gestures noted but pt was compliant with redirection. Pt tearful when talking to grandmother on the phone this afternoon. 5pm pt noted to stand in corner near [facility]. Nurse inquired if pt needed anything, pt began yelling "I need my fucking meds!" Pt received agitation protocol PO. Pt continued to escalate yelling and threatening staff. Pt restricted from going to dinner due to behavior. Pt given dinner trays and additional staff on unit for safety. Pt accepted food trays and ate on unit. 545 pt apologetic, tearful, stating he has so much going on and feels very angry with [place] police for taking his phone. Pt also states he feels emotional today missing his grandmother and concerned for her. Pt calm at this time. Q 15 minute checks ongoing.

## 2023-01-29 NOTE — Progress Notes (Signed)
   01/29/23 0547  15 Minute Checks  Location Bedroom  Visual Appearance Calm  Behavior Sleeping  Sleep (Behavioral Health Patients Only)  Calculate sleep? (Click Yes once per 24 hr at 0600 safety check) Yes  Documented sleep last 24 hours 7

## 2023-01-30 ENCOUNTER — Encounter (HOSPITAL_COMMUNITY): Payer: Self-pay

## 2023-01-30 MED ORDER — DIVALPROEX SODIUM 250 MG PO DR TAB
750.0000 mg | DELAYED_RELEASE_TABLET | Freq: Two times a day (BID) | ORAL | Status: DC
  Administered 2023-01-30 – 2023-02-02 (×7): 750 mg via ORAL
  Filled 2023-01-30 (×10): qty 3

## 2023-01-30 MED ORDER — LORAZEPAM 0.5 MG PO TABS
0.2500 mg | ORAL_TABLET | Freq: Three times a day (TID) | ORAL | Status: AC
  Administered 2023-01-30 – 2023-01-31 (×3): 0.25 mg via ORAL
  Filled 2023-01-30 (×3): qty 1

## 2023-01-30 MED ORDER — CHLORPROMAZINE HCL 25 MG/ML IJ SOLN: 50.0000 mg | Freq: Three times a day (TID) | INTRAMUSCULAR | Status: DC | PRN

## 2023-01-30 MED ORDER — OLANZAPINE 5 MG PO TBDP
5.0000 mg | ORAL_TABLET | Freq: Every day | ORAL | Status: DC
  Administered 2023-01-30: 5 mg via ORAL
  Filled 2023-01-30 (×2): qty 1

## 2023-01-30 MED ORDER — CHLORPROMAZINE HCL 25 MG PO TABS
75.0000 mg | ORAL_TABLET | Freq: Three times a day (TID) | ORAL | Status: DC
  Administered 2023-01-30 – 2023-02-01 (×6): 75 mg via ORAL
  Filled 2023-01-30 (×9): qty 3

## 2023-01-30 MED ORDER — OLANZAPINE 5 MG PO TBDP
5.0000 mg | ORAL_TABLET | Freq: Every day | ORAL | Status: DC
  Administered 2023-01-31: 5 mg via ORAL
  Filled 2023-01-30 (×2): qty 1

## 2023-01-30 NOTE — Group Note (Signed)
[facility] LCSW Group Therapy Note  Date/Time: 5/13 at 2pm.  Type of Therapy and Topic:  Group Therapy:  Who Am I?  Self Esteem, Self-Actualization and Understanding Self.  Participation Level:  Did not attend  Description of Group:    In this group patients will be asked to explore values, beliefs, truths, and morals as they relate to personal self.  Patients will be guided to discuss their thoughts, feelings, and behaviors related to what they identify as important to their true self. Patients will process together how values, beliefs and truths are connected to specific choices patients make every day. Each patient will be challenged to identify changes that they are motivated to make in order to improve self-esteem and self-actualization. This group will be process-oriented, with patients participating in exploration of their own experiences as well as giving and receiving support and challenge from other group members.  Therapeutic Goals: Patient will identify false beliefs that currently interfere with their self-esteem.  Patient will identify feelings, thought process, and behaviors related to self and will become aware of the uniqueness of themselves and of others.  Patient will be able to identify and verbalize values, morals, and beliefs as they relate to self. Patient will begin to learn how to build self-esteem/self-awareness by expressing what is important and unique to them personally.  Summary of Patient Progress             Therapeutic Modalities:   Cognitive Behavioral Therapy Solution Focused Therapy Motivational Interviewing Brief Therapy   Quatavious Rossa, LCSW, LCAS Clincal Social Worker  [facility]

## 2023-01-30 NOTE — Group Note (Signed)
Recreation Therapy Group Note   Group Topic:Coping Skills  Group Date: 01/30/2023 Start Time: 1010 End Time: 1045 Facilitators: Devora Tortorella-McCall, LRT,CTRS Location: [address] Dayroom   Goal Area(s) Addresses: Patient will define what a coping skill is. Patient will create a list of healthy coping skills beginning with each letter of the alphabet. Patient will successfully identify positive coping skills they can use post d/c.  Patient will acknowledge benefit(s) of using learned coping skills post d/c.  Group Description:  Coping A to Z. Patient asked to identify what a coping skill is and when they use them. Patients with Clinical research associate discussed healthy versus unhealthy coping skills. Next patients were given a blank worksheet titled "Coping Skills A-Z".  Patients were instructed to come up with at least one positive coping skill per letter of the alphabet.  Patients were given 15 minutes to brainstorm before ideas were presented. Patients and LRT debriefed on the importance of coping skill selection based on situation and back-up plans when a skill tried is not effective.    Affect/Mood: Angry   Participation Level: None   Participation Quality: None   Behavior: Agitated and Disruptive   Speech/Thought Process: Distracted   Insight: None   Judgement: None   Modes of Intervention: Worksheet   Patient Response to Interventions:  Avoidant   Education Outcome:  In group clarification offered    Clinical Observations/Individualized Feedback: Pt came into group frustrated and upset.  Pt was also agitated and unable to focus of activity.  Pt expressed being upset with doctor about his medications.  Pt was making sexual inappropriate comments about doctor.  Pt also stated the staff was scared of him and they hadn't seen anything wet.    Plan: Continue to engage patient in RT group sessions 2-3x/week.   Caroll Rancher, Antonietta Jewel 01/30/2023 12:45 PM

## 2023-01-30 NOTE — Group Note (Unsigned)
Date:  01/30/2023 Time:  9:34 PM  Group Topic/Focus:  Wrap-Up Group:   The focus of this group is to help patients review their daily goal of treatment and discuss progress on daily workbooks.     Participation Level:  {[facility] PARTICIPATION LEVEL:22264}  Participation Quality:  {[facility] PARTICIPATION QUALITY:22265}  Affect:  {[facility] AFFECT:22266}  Cognitive:  {[facility] COGNITIVE:22267}  Insight: {B[facility] Insight2:20797}  Engagement in Group:  {[facility] ENGAGEMENT IN GROUP:22268}  Modes of Intervention:  {[facility] MODES OF INTERVENTION:22269}  Additional Comments:  ***  Trinna Kunst Dacosta 01/30/2023, 9:34 PM  

## 2023-01-30 NOTE — Progress Notes (Signed)
   01/30/23 0547  15 Minute Checks  Location Bedroom  Visual Appearance Calm  Behavior Sleeping  Sleep (Behavioral Health Patients Only)  Calculate sleep? (Click Yes once per 24 hr at 0600 safety check) Yes  Documented sleep last 24 hours 9.25

## 2023-01-30 NOTE — Plan of Care (Signed)
  Problem: Health Behavior/Discharge Planning: Goal: Compliance with prescribed medication regimen will improve Outcome: Progressing   Problem: Nutritional: Goal: Ability to achieve adequate nutritional intake will improve Outcome: Progressing   Problem: Role Relationship: Goal: Ability to communicate needs accurately will improve Outcome: Progressing   

## 2023-01-30 NOTE — Progress Notes (Signed)
    01/30/23 2100  Psych Admission Type (Psych Patients Only)  Admission Status Involuntary  Psychosocial Assessment  Patient Complaints Irritability  Eye Contact Fair  Facial Expression Animated  Affect Labile  Speech Pressured  Interaction Assertive  Motor Activity Fidgety  Appearance/Hygiene In scrubs  Behavior Characteristics Irritable  Mood Labile  Thought Process  Coherency Circumstantial  Content Preoccupation  Delusions None reported or observed  Perception WDL  Hallucination None reported or observed  Judgment Poor  Confusion None  Danger to Self  Current suicidal ideation? Denies  Danger to Others  Danger to Others None reported or observed    On assessment, patient is observed laying in his floor, covered with a blanket.  Writer introduces herself and patient Passenger transport manager to turn the light on.  Patient is alert and oriented.  Patient is irritable, but when asked by writer to refrain from using profanity, patient agrees.  Patient is inquiring about a phone that he says is not here.  Patient denies SI/HI/AVH and contracts for safety.  Vitals obtained.  Medications administered.  Patient is safe on the unit with q15 minute safety checks.

## 2023-01-30 NOTE — Group Note (Unsigned)
Date:  01/30/2023 Time:  9:51 AM  Group Topic/Focus:  Goals Group:   The focus of this group is to help patients establish daily goals to achieve during treatment and discuss how the patient can incorporate goal setting into their daily lives to aide in recovery.     Participation Level:  {BHH PARTICIPATION ZOXWR:60454}  Participation Quality:  {BHH PARTICIPATION QUALITY:22265}  Affect:  {BHH AFFECT:22266}  Cognitive:  {BHH COGNITIVE:22267}  Insight: {BHH Insight2:20797}  Engagement in Group:  {BHH ENGAGEMENT IN UJWJX:91478}  Modes of Intervention:  {BHH MODES OF INTERVENTION:22269}  Additional Comments:  ***  Vincent [name] 01/30/2023, 9:51 AM

## 2023-01-30 NOTE — Progress Notes (Addendum)
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 While at [facility] patient remained irate and agitated screaming stomping his feet; he mentioned needing an injection reportedly he was on Abilify Maintena 400 mg IM but has not received it recently. He receives outpatient services at the [facility] and agreed to have his injection; however due to his continued level of agitation he was transferred to [facility] for further stabilization. While at ED pt required multiple forced medications for continued agitation and paranoia. He was transferred to the inpatient floor 01/24/23 where upon arrival he continued to be very bizarre irritable aggressive pacing in the hallways sometimes rambling and very difficult to discern what he was saying.   On my assessment today, patient was cooperative but irritable.  He denied any AH, VH, paranoia and did not demonstrate any delusional thoughts during my interview with him.  Mood is irritable.  Anxiety level fluctuates but remains elevated at baseline.  Sleep is okay.  Patient reports appetite is okay.  Denies any SI or HI on my exam.  He is fixated on discharge.  We discussed that he received as needed medication yesterday for agitation, and he is not psychiatrically stable for discharge at this time.  Shortly after I left the room, nursing message that the patient was irritable, threatening violence toward staff.  Patient was redirected to his room, and staff will monitor if he needs agitation as needed  medication.  Staff is hesitant about EKG due to patient's irritability and aggression and threats of violence towards them, and I discussed with the staff, to get the EKG with a feel safe and that the patient is not a threat to harming them.  Pt placed on unit restrictions for safety of others in a less restrictive setting.    Principal Problem: Schizoaffective disorder, bipolar type (HCC) Diagnosis: Principal Problem:   Schizoaffective disorder, bipolar type (HCC) Active Problems:   Cannabis use disorder, severe, dependence (HCC)  Total Time spent with patient: 20 minutes  Past Psychiatric History:   Schizoaffective disorder, managed as outpatient with Abilify Maintena 400 mg IM LAI, last dose was ?12-29-22?   Past Medical History:  Past Medical History:  Diagnosis Date   Anxiety    Schizophrenia (HCC)     Past Surgical History:  Procedure Laterality Date   MANDIBLE SURGERY     WRIST SURGERY     laceration by glass that pt sts was closed with surgery   Family History: History reviewed. No pertinent family history.  Family Psychiatric  History: See H&P  Social History:  Social History   Substance and Sexual Activity  Alcohol Use Yes   Alcohol/week: 1.0 standard drink of alcohol   Types: 1 Shots of liquor per week   Comment: occasionally/ maybe once a month     Social History   Substance and Sexual Activity  Drug Use Yes   Types: Marijuana, Amphetamines, Cocaine   Comment: 1-2 zx per month    Social History   Socioeconomic History   Marital status: Single    Spouse name: Not on  file   Number of children: Not on file   Years of education: Not on file   Highest education level: Not on file  Occupational History   Not on file  Tobacco Use   Smoking status: Every Day    Packs/day: 1.00    Years: 2.00    Additional pack years: 0.00    Total pack years: 2.00    Types: Cigarettes   Smokeless tobacco: Never  Vaping Use   Vaping Use: Never used  Substance and  Sexual Activity   Alcohol use: Yes    Alcohol/week: 1.0 standard drink of alcohol    Types: 1 Shots of liquor per week    Comment: occasionally/ maybe once a month   Drug use: Yes    Types: Marijuana, Amphetamines, Cocaine    Comment: 1-2 zx per month   Sexual activity: Not Currently    Birth control/protection: None  Other Topics Concern   Not on file  Social History Narrative   Not on file   Social Determinants of Health   Financial Resource Strain: Not on file  Food Insecurity: Food Insecurity Present (01/24/2023)   Hunger Vital Sign    Worried About Running Out of Food in the Last Year: Sometimes true    Ran Out of Food in the Last Year: Sometimes true  Transportation Needs: Unmet Transportation Needs (01/24/2023)   PRAPARE - Administrator, Civil Service (Medical): Yes    Lack of Transportation (Non-Medical): Yes  Physical Activity: Not on file  Stress: Not on file  Social Connections: Not on file   Additional Social History:                          Current Medications: Current Facility-Administered Medications  Medication Dose Route Frequency Provider Last Rate Last Admin   acetaminophen (TYLENOL) tablet 650 mg  650 mg Oral Q6H PRN Dahlia Byes C, NP   650 mg at 01/28/23 0832   alum & mag hydroxide-simeth (MAALOX/MYLANTA) 200-200-20 MG/5ML suspension 30 mL  30 mL Oral Q4H PRN Dahlia Byes C, NP       ARIPiprazole ER (ABILIFY MAINTENA) injection 400 mg  400 mg Intramuscular Q28 days Sem Mccaughey, Harrold Donath, MD   400 mg at 01/26/23 1700   chlorproMAZINE (THORAZINE) injection 50 mg  50 mg Intramuscular TID PRN Everline Mahaffy, Harrold Donath, MD       chlorproMAZINE (THORAZINE) tablet 50 mg  50 mg Oral TID PRN  Wigger, Harrold Donath, MD       chlorproMAZINE (THORAZINE) tablet 75 mg  75 mg Oral Q8H Meilyn Heindl, MD       cloNIDine (CATAPRES) tablet 0.2 mg  0.2 mg Oral Q12H Charrise Lardner, MD   0.2 mg at 01/30/23 1610   diphenhydrAMINE (BENADRYL) capsule  50 mg  50 mg Oral TID PRN Dahlia Byes C, NP   50 mg at 01/29/23 1715   Or   diphenhydrAMINE (BENADRYL) injection 50 mg  50 mg Intramuscular TID PRN Dahlia Byes C, NP   50 mg at 01/27/23 0748   divalproex (DEPAKOTE) DR tablet 750 mg  750 mg Oral Q12H Semaja Lymon, MD   750 mg at 01/30/23 1122   gabapentin (NEURONTIN) capsule 600 mg  600 mg Oral TID Loni Beckwith R, MD   600 mg at 01/30/23 1123   LORazepam (ATIVAN) tablet 2 mg  2 mg Oral TID PRN Dahlia Byes C, NP   2 mg at 01/29/23 1714   Or  LORazepam (ATIVAN) injection 2 mg  2 mg Intramuscular TID PRN Dahlia Byes C, NP   2 mg at 01/27/23 0748   LORazepam (ATIVAN) tablet 0.5 mg  0.5 mg Oral Q8H Nellene Courtois, MD   0.5 mg at 01/30/23 1610   magnesium hydroxide (MILK OF MAGNESIA) suspension 30 mL  30 mL Oral Daily PRN Dahlia Byes C, NP       nicotine (NICODERM CQ - dosed in mg/24 hours) patch 14 mg  14 mg Transdermal Daily Onuoha, Chinwendu V, NP   14 mg at 01/29/23 1757   OLANZapine zydis (ZYPREXA) disintegrating tablet 5 mg  5 mg Oral QHS Kush Farabee, Harrold Donath, MD       Melene Muller ON 01/31/2023] OLANZapine zydis (ZYPREXA) disintegrating tablet 5 mg  5 mg Oral Daily Mattea Seger, MD       traZODone (DESYREL) tablet 100 mg  100 mg Oral QHS PRN Ramy Greth, Harrold Donath, MD       traZODone (DESYREL) tablet 200 mg  200 mg Oral QHS Ginevra Tacker, Harrold Donath, MD   200 mg at 01/29/23 2223   valbenazine (INGREZZA) capsule 40 mg  40 mg Oral Daily Dahlia Byes C, NP   40 mg at 01/30/23 9604    Lab Results:  Results for orders placed or performed during the hospital encounter of 01/24/23 (from the past 48 hour(s))  Hepatitis panel, acute     Status: None   Collection Time: 01/28/23  6:43 PM  Result Value Ref Range   Hepatitis B Surface Ag NON REACTIVE NON REACTIVE   HCV Ab NON REACTIVE NON REACTIVE    Comment: (NOTE) Nonreactive HCV antibody screen is consistent with no HCV infections,  unless recent infection is  suspected or other evidence exists to indicate HCV infection.     Hep A IgM NON REACTIVE NON REACTIVE   Hep B C IgM NON REACTIVE NON REACTIVE    Comment: Performed at [facility], [address] [place], [place] [address]  Lipid panel     Status: Abnormal   Collection Time: 01/28/23  6:43 PM  Result Value Ref Range   Cholesterol 137 0 - 200 mg/dL   Triglycerides 119 (H) <150 mg/dL   HDL 48 >14 mg/dL   Total CHOL/HDL Ratio 2.9 RATIO   VLDL 41 (H) 0 - 40 mg/dL   LDL Cholesterol 48 0 - 99 mg/dL    Comment:        Total Cholesterol/HDL:CHD Risk Coronary Heart Disease Risk Table                     Men   Women  1/2 Average Risk   3.4   3.3  Average Risk       5.0   4.4  2 X Average Risk   9.6   7.1  3 X Average Risk  23.4   11.0        Use the calculated Patient Ratio above and the CHD Risk Table to determine the patient's CHD Risk.        ATP III CLASSIFICATION (LDL):  <100     mg/dL   Optimal  782-956  mg/dL   Near or Above                    Optimal  130-159  mg/dL   Borderline  213-086  mg/dL   High  >578     mg/dL   Very High Performed at [facility], [address] [place], [place] [address]   Hemoglobin A1c     Status: Abnormal   Collection Time: 01/28/23  6:43 PM  Result Value Ref Range   Hgb A1c MFr Bld 5.7 (H) 4.8 - 5.6 %    Comment: (NOTE) Pre diabetes:          5.7%-6.4%  Diabetes:              >6.4%  Glycemic control for   <7.0% adults with diabetes    Mean Plasma Glucose 116.89 mg/dL    Comment: Performed at [facility], [address], [place], [place] [address]  Hepatic function panel     Status: Abnormal   Collection Time: 01/28/23  6:43 PM  Result Value Ref Range   Total Protein 7.1 6.5 - 8.1 g/dL   Albumin 3.8 3.5 - 5.0 g/dL   AST 54 (H) 15 - 41 U/L   ALT 48 (H) 0 - 44 U/L   Alkaline Phosphatase 77 38 - 126 U/L   Total Bilirubin 0.5 0.3 - 1.2 mg/dL   Bilirubin, Direct <0.9 0.0 - 0.2 mg/dL   Indirect  Bilirubin NOT CALCULATED 0.3 - 0.9 mg/dL    Comment: Performed at [facility], [address], [place], [place] [address]    Blood Alcohol level:  Lab Results  Component Value Date   [facility] <10 01/23/2023   ETH <5 10/17/2014    Metabolic Disorder Labs: Lab Results  Component Value Date   HGBA1C 5.7 (H) 01/28/2023   MPG 116.89 01/28/2023   MPG 114 10/21/2014   Lab Results  Component Value Date   PROLACTIN 0.8 (L) 08/04/2022   Lab Results  Component Value Date   CHOL 137 01/28/2023   TRIG 204 (H) 01/28/2023   HDL 48 01/28/2023   CHOLHDL 2.9 01/28/2023   VLDL 41 (H) 01/28/2023   LDLCALC 48 01/28/2023   LDLCALC 74 08/04/2022    Physical Findings: AIMS: Facial and Oral Movements Muscles of Facial Expression: None, normal Lips and Perioral Area: None, normal Jaw: None, normal Tongue: None, normal,Extremity Movements Upper (arms, wrists, hands, fingers): None, normal Lower (legs, knees, ankles, toes): None, normal, Trunk Movements Neck, shoulders, hips: None, normal, Overall Severity Severity of abnormal movements (highest score from questions above): None, normal Incapacitation due to abnormal movements: None, normal Patient's awareness of abnormal movements (rate only patient's report): No Awareness, Dental Status Current problems with teeth and/or dentures?: No Does patient usually wear dentures?: No  CIWA:    COWS:     Musculoskeletal: Strength & Muscle Tone: within normal limits Gait & Station: normal Patient leans: N/A  Psychiatric Specialty Exam:  Presentation  General Appearance:  Disheveled  Eye Contact: Fair  Speech: Normal Rate  Speech Volume: Normal  Handedness: Right   Mood and Affect  Mood: Anxious; Irritable  Affect: Labile   Thought Process  Thought Processes: Goal Directed  Descriptions of Associations:Intact  Orientation:Full (Time, Place and Person)  Thought Content:Illogical  History of  Schizophrenia/Schizoaffective disorder:Yes  Duration of Psychotic Symptoms:Greater than six months  Hallucinations:Hallucinations: None  Ideas of Reference:-- (denies)  Suicidal Thoughts:Suicidal Thoughts: No  Homicidal Thoughts:Homicidal Thoughts: No (later threatening staff after my interview)   Sensorium  Memory: Immediate Good; Recent Good; Remote Good  Judgment: Impaired  Insight: Lacking   Executive Functions  Concentration: Poor  Attention Span: Poor  Recall: [employer] of Knowledge: Fair  Language: Fair   Psychomotor Activity  Psychomotor Activity: Psychomotor Activity: Normal  Assets  Assets: Manufacturing systems engineer; Physical Health; Resilience   Sleep  Sleep: Sleep: Fair    Physical Exam: Physical Exam Vitals reviewed.  Pulmonary:     Effort: Pulmonary effort is normal.  Neurological:     Mental Status: He is alert.     Motor: No weakness.     Gait: Gait normal.    Review of Systems  Constitutional:  Negative for chills and fever.  Cardiovascular:  Negative for chest pain and palpitations.  Neurological:  Negative for dizziness, tingling, tremors and headaches.  Psychiatric/Behavioral:  Positive for substance abuse. Negative for depression, hallucinations, memory loss and suicidal ideas. The patient is nervous/anxious. The patient does not have insomnia.   All other systems reviewed and are negative.  Blood pressure 139/80, pulse 80, temperature 97.7 F (36.5 C), temperature source Oral, resp. rate 20, height 6' (1.829 m), weight 86.2 kg, SpO2 100 %. Body mass index is 25.77 kg/m.   Treatment Plan Summary: Daily contact with patient to assess and evaluate symptoms and progress in treatment and Medication management  Patient is overall improvement since he was admitted to this unit 5 days ago.  He does still does have mood lability and irritability, threatening violence toward staff.  Sleep is much better.  Today I will  increase the Thorazine, and decrease Zyprexa, and attempt to consolidate antipsychotics to a total number of 2.  We will also start Depakote for mood stabilization.  I will also taper off of the Ativan, if this is disinhibiting.  Diagnoses / Active Problems: -Schizoaffective disorder bipolar type -GAD -Rule out TD on Ingrezza, no current TD symptoms, recently started Ingrezza   PLAN: Safety and Monitoring:             -- Involuntary admission to inpatient psychiatric unit for safety, stabilization and treatment             -- Daily contact with patient to assess and evaluate symptoms and progress in treatment             -- Patient's case to be discussed in multi-disciplinary team meeting             -- Observation Level : q15 minute checks             -- Vital signs:  q12 hours             -- Precautions: suicide, elopement, and assault   2. Psychiatric Diagnoses and Treatment:               -Decrease Zyprexa to 5 mg q12H -  for psychosis and mood stabilziation.  -Previously stopped Cogentin due to concern for TD -Continue Ingrezza 40 mg once daily for TD, started as outpatient on 12/29/2022 -Continue clonidine 0.2 mg every 12 hours for HTN and impulse control -Previously administered Abilify Maintena 400 mg LAI on 01-26-23.  -Previously Stop Haldol as needed for agitation from 5 mg to 10 mg PRN (PO or IM)  -Start Thorazine 50 mg tid prn PO or IM for agitation (replacing haldol) -order EKG -Increase thorazine from 50 mg q8H to 75 mg q8H scheduled for psychosis, mood stabilization, and aggression  -Decrease Ativan from 0.5 mg every 8 hours -> to 0.25 mg q8H for 3 doses, then stop - for anxiety -Continue trazodone 200 mg nightly for insomnia, plus 100 mg qhs prn      --  The risks/benefits/side-effects/alternatives to this medication were discussed in detail with the patient and time was given  for questions. The patient consents to medication trial.                -- Metabolic profile  and EKG monitoring obtained while on an atypical antipsychotic (BMI: Lipid Panel: HbgA1c: QTc:)              -- Encouraged patient to participate in unit milieu and in scheduled group therapies                              3. Medical Issues Being Addressed:              Tobacco Use Disorder             -- Nicotine patch 14 mg/24 hours ordered             -- Smoking cessation encouraged   4. Discharge Planning:              -- Social work and case management to assist with discharge planning and identification of hospital follow-up needs prior to discharge             -- Estimated LOS: 5-7 days             -- Discharge Concerns: Need to establish a safety plan; Medication compliance and effectiveness             -- Discharge Goals: Return home with outpatient referrals for mental health follow-up including medication management/psychotherapy       Cristy Hilts, MD 01/30/2023, 12:34 PM  Total Time Spent in Direct Patient Care:  I personally spent 35 minutes on the unit in direct patient care. The direct patient care time included face-to-face time with the patient, reviewing the patient's chart, communicating with other professionals, and coordinating care. Greater than 50% of this time was spent in counseling or coordinating care with the patient regarding goals of hospitalization, psycho-education, and discharge planning needs.   Phineas Inches, MD Psychiatrist

## 2023-01-30 NOTE — Progress Notes (Signed)
Dar Note: Patient presents with angry affect and irritable mood throughout this shift.  Patient verbally aggressive and abusive towards staff, cussing, yelling and demanding to leave.  Stated, "if I don't discharge tomorrow I'm going to fuck you all up."  Patient continues to need  a lot of redirection on the unit.  Patient in milieu being loud and disruptive during therapy.  Medications given as prescribed.  Routine safety checks maintained.  Support and encouragement offered as needed.  Patient safe on the unit.

## 2023-01-30 NOTE — BH IP Treatment Plan (Unsigned)
Interdisciplinary Treatment and Diagnostic Plan Update  01/30/2023 Time of Session: 8:30am, update Vincent [name] MRN: [id number]  Principal Diagnosis: Schizoaffective disorder, bipolar type (HCC)  Secondary Diagnoses: Principal Problem:   Schizoaffective disorder, bipolar type (HCC) Active Problems:   Cannabis use disorder, severe, dependence (HCC)   Current Medications:  Current Facility-Administered Medications  Medication Dose Route Frequency Provider Last Rate Last Admin   acetaminophen (TYLENOL) tablet 650 mg  650 mg Oral Q6H PRN Dahlia Byes C, NP   650 mg at 01/28/23 0832   alum & mag hydroxide-simeth (MAALOX/MYLANTA) 200-200-20 MG/5ML suspension 30 mL  30 mL Oral Q4H PRN Earney Navy, NP       ARIPiprazole ER (ABILIFY MAINTENA) injection 400 mg  400 mg Intramuscular Q28 days Massengill, Harrold Donath, MD   400 mg at 01/26/23 1700   chlorproMAZINE (THORAZINE) tablet 50 mg  50 mg Oral TID PRN Phineas Inches, MD       Or   chlorproMAZINE (THORAZINE) tablet 50 mg  50 mg Oral TID PRN Phineas Inches, MD   50 mg at 01/29/23 1715   chlorproMAZINE (THORAZINE) tablet 75 mg  75 mg Oral Q8H Massengill, Nathan, MD       cloNIDine (CATAPRES) tablet 0.2 mg  0.2 mg Oral Q12H Massengill, Harrold Donath, MD   0.2 mg at 01/30/23 4098   diphenhydrAMINE (BENADRYL) capsule 50 mg  50 mg Oral TID PRN Dahlia Byes C, NP   50 mg at 01/29/23 1715   Or   diphenhydrAMINE (BENADRYL) injection 50 mg  50 mg Intramuscular TID PRN Dahlia Byes C, NP   50 mg at 01/27/23 0748   gabapentin (NEURONTIN) capsule 600 mg  600 mg Oral TID Loni Beckwith R, MD   600 mg at 01/30/23 1191   LORazepam (ATIVAN) tablet 2 mg  2 mg Oral TID PRN Dahlia Byes C, NP   2 mg at 01/29/23 1714   Or   LORazepam (ATIVAN) injection 2 mg  2 mg Intramuscular TID PRN Dahlia Byes C, NP   2 mg at 01/27/23 0748   LORazepam (ATIVAN) tablet 0.5 mg  0.5 mg Oral Q8H Massengill, Nathan, MD   0.5 mg at 01/30/23  4782   magnesium hydroxide (MILK OF MAGNESIA) suspension 30 mL  30 mL Oral Daily PRN Dahlia Byes C, NP       nicotine (NICODERM CQ - dosed in mg/24 hours) patch 14 mg  14 mg Transdermal Daily Onuoha, Chinwendu V, NP   14 mg at 01/29/23 1757   OLANZapine zydis (ZYPREXA) disintegrating tablet 5 mg  5 mg Oral QHS Massengill, Harrold Donath, MD       Melene Muller ON 01/31/2023] OLANZapine zydis (ZYPREXA) disintegrating tablet 5 mg  5 mg Oral Daily Massengill, Nathan, MD       traZODone (DESYREL) tablet 100 mg  100 mg Oral QHS PRN Massengill, Harrold Donath, MD       traZODone (DESYREL) tablet 200 mg  200 mg Oral QHS Massengill, Harrold Donath, MD   200 mg at 01/29/23 2223   valbenazine (INGREZZA) capsule 40 mg  40 mg Oral Daily Dahlia Byes C, NP   40 mg at 01/30/23 9562   PTA Medications: Facility-Administered Medications Prior to Admission  Medication Dose Route Frequency Provider Last Rate Last Admin   ARIPiprazole ER (ABILIFY MAINTENA) 400 MG prefilled syringe 400 mg  400 mg Intramuscular Q30 days Zena Amos, MD   400 mg at 12/29/22 1017   Medications Prior to Admission  Medication Sig Dispense Refill Last Dose  amoxicillin-clavulanate (AUGMENTIN) 875-125 MG tablet Take 1 tablet by mouth every 12 (twelve) hours. (Patient not taking: Reported on 01/23/2023) 14 tablet 0    ARIPiprazole ER (ABILIFY MAINTENA) 400 MG PRSY prefilled syringe Inject 400 mg into the muscle every 28 (twenty-eight) days. (Patient taking differently: Inject 400 mg into the muscle every 28 (twenty-eight) days.) 1 each 10    benztropine (COGENTIN) 0.5 MG tablet Take 0.5 mg by mouth 2 (two) times daily.      gabapentin (NEURONTIN) 300 MG capsule Take 1 capsule (300 mg total) by mouth 3 (three) times daily. 90 capsule 3    ibuprofen (ADVIL) 800 MG tablet Take 1 tablet (800 mg total) by mouth 3 (three) times daily. 21 tablet 0    valbenazine (INGREZZA) 40 MG capsule Take 1 capsule (40 mg total) by mouth daily. 30 capsule 3     Patient  Stressors: Marital or family conflict   Medication change or noncompliance   Substance abuse    Patient Strengths: General fund of knowledge  Motivation for treatment/growth  Supportive family/friends   Treatment Modalities: Medication Management, Group therapy, Case management,  1 to 1 session with clinician, Psychoeducation, Recreational therapy.   Physician Treatment Plan for Primary Diagnosis: Schizoaffective disorder, bipolar type (HCC) Long Term Goal(s): Improvement in symptoms so as ready for discharge   Short Term Goals: Ability to identify changes in lifestyle to reduce recurrence of condition will improve Ability to verbalize feelings will improve Ability to disclose and discuss suicidal ideas Ability to demonstrate self-control will improve Ability to identify and develop effective coping behaviors will improve Ability to maintain clinical measurements within normal limits will improve Compliance with prescribed medications will improve Ability to identify triggers associated with substance abuse/mental health issues will improve  Medication Management: Evaluate patient's response, side effects, and tolerance of medication regimen.  Therapeutic Interventions: 1 to 1 sessions, Unit Group sessions and Medication administration.  Evaluation of Outcomes: Progressing  Physician Treatment Plan for Secondary Diagnosis: Principal Problem:   Schizoaffective disorder, bipolar type (HCC) Active Problems:   Cannabis use disorder, severe, dependence (HCC)  Long Term Goal(s): Improvement in symptoms so as ready for discharge   Short Term Goals: Ability to identify changes in lifestyle to reduce recurrence of condition will improve Ability to verbalize feelings will improve Ability to disclose and discuss suicidal ideas Ability to demonstrate self-control will improve Ability to identify and develop effective coping behaviors will improve Ability to maintain clinical  measurements within normal limits will improve Compliance with prescribed medications will improve Ability to identify triggers associated with substance abuse/mental health issues will improve     Medication Management: Evaluate patient's response, side effects, and tolerance of medication regimen.  Therapeutic Interventions: 1 to 1 sessions, Unit Group sessions and Medication administration.  Evaluation of Outcomes: Progressing   RN Treatment Plan for Primary Diagnosis: Schizoaffective disorder, bipolar type (HCC) Long Term Goal(s): Knowledge of disease and therapeutic regimen to maintain health will improve  Short Term Goals: Ability to remain free from injury will improve, Ability to verbalize frustration and anger appropriately will improve, Ability to demonstrate self-control, Ability to participate in decision making will improve, Ability to verbalize feelings will improve, Ability to disclose and discuss suicidal ideas, Ability to identify and develop effective coping behaviors will improve, and Compliance with prescribed medications will improve  Medication Management: RN will administer medications as ordered by provider, will assess and evaluate patient's response and provide education to patient for prescribed medication. RN will report any  adverse and/or side effects to prescribing provider.  Therapeutic Interventions: 1 on 1 counseling sessions, Psychoeducation, Medication administration, Evaluate responses to treatment, Monitor vital signs and CBGs as ordered, Perform/monitor CIWA, COWS, AIMS and Fall Risk screenings as ordered, Perform wound care treatments as ordered.  Evaluation of Outcomes: Progressing   LCSW Treatment Plan for Primary Diagnosis: Schizoaffective disorder, bipolar type (HCC) Long Term Goal(s): Safe transition to appropriate next level of care at discharge, Engage patient in therapeutic group addressing interpersonal concerns.  Short Term Goals: Engage  patient in aftercare planning with referrals and resources, Increase social support, Increase ability to appropriately verbalize feelings, Increase emotional regulation, Facilitate acceptance of mental health diagnosis and concerns, Facilitate patient progression through stages of change regarding substance use diagnoses and concerns, Identify triggers associated with mental health/substance abuse issues, and Increase skills for wellness and recovery  Therapeutic Interventions: Assess for all discharge needs, 1 to 1 time with Social worker, Explore available resources and support systems, Assess for adequacy in community support network, Educate family and significant other(s) on suicide prevention, Complete Psychosocial Assessment, Interpersonal group therapy.  Evaluation of Outcomes: Progressing   Progress in Treatment: Attending groups: No. Participating in groups: No. Taking medication as prescribed: Yes. Toleration medication: Yes. Family/Significant other contact made: No, will contact:  CSW will continue to assess and identify social support  Patient understands diagnosis: No. Discussing patient identified problems/goals with staff: No. Medical problems stabilized or resolved: Yes. Denies suicidal/homicidal ideation: Yes. Issues/concerns per patient self-inventory: No.   New problem(s) identified: No, Describe:  none reported  New Short Term/Long Term Goal(s):   medication stabilization, elimination of SI thoughts, development of comprehensive mental wellness plan.    Patient Goals:  Pt has not been able to attend a tx team due to refusing and acuity.  Discharge Plan or Barriers: Patient recently admitted. CSW will continue to follow and assess for appropriate referrals and possible discharge planning.    Reason for Continuation of Hospitalization: Anxiety Depression Mania Medication stabilization  Estimated Length of Stay: 5-7 days  Last 3 [place] Suicide Severity Risk  Score: Flowsheet Row Ad[facility] (Current) [facility] from [facility]23[facility]2023 [facility] from 01/06/2023 in [facility] Department at [facility]  C-SSRS RISK CATEGORY No Risk No Risk No Risk       Last PHQ 2/9 Scores:    12/29/2022   11:10 AM 11/24/2022    2:22 PM 06/09/2022   12:22 PM  Depression screen PHQ 2/9  Decreased Interest 3 3 0  Down, Depressed, Hopeless 3 1 0  PHQ - 2 Score 6 4 0  Altered sleeping 3 0   Tired, decreased energy 3 0   Change in appetite 1 3   Feeling bad or failure about yourself  3 2   Trouble concentrating 3 0   Moving slowly or fidgety/restless 3 0   Suicidal thoughts 3 0   PHQ-9 Score 25 9   Difficult doing work/chores Extremely dIfficult Not difficult at all     Scribe for Treatment Team: Beatris Si, LCSW 01/30/2023 9:47 AM

## 2023-01-31 MED ORDER — OLANZAPINE 5 MG PO TBDP
2.5000 mg | ORAL_TABLET | Freq: Every day | ORAL | Status: DC
  Filled 2023-01-31 (×2): qty 0.5

## 2023-01-31 MED ORDER — CLONIDINE HCL 0.2 MG PO TABS
0.2000 mg | ORAL_TABLET | Freq: Three times a day (TID) | ORAL | Status: DC
  Administered 2023-01-31 – 2023-02-02 (×7): 0.2 mg via ORAL
  Filled 2023-01-31 (×10): qty 1

## 2023-01-31 MED ORDER — OLANZAPINE 2.5 MG PO TABS
2.5000 mg | ORAL_TABLET | ORAL | Status: DC
  Administered 2023-01-31: 2.5 mg via ORAL
  Filled 2023-01-31 (×4): qty 1

## 2023-01-31 NOTE — Group Note (Signed)
Recreation Therapy Group Note   Group Topic:Healthy Decision Making  Group Date: 01/31/2023 Start Time: 1040 End Time: 1122 Facilitators: Zeno Hickel-McCall, LRT,CTRS Location: [address] Dayroom   Goal Area(s) Addresses:  Patient will effectively work with peer towards shared goal.  Patient will identify factors that guided their decision making.  Patient will pro-socially communicate ideas during group session.   Group Description: Patients were given a scenario that they were going to be stranded on a deserted [place] for several months before being rescued. Writer tasked them with making a list of 15 things they would choose to bring with them for "survival". The list of items was prioritized most important to least. Each patient would come up with their own list, then work together to create a new list of 15 items while in a group of 3-5 peers. LRT discussed each person's list and how it differed from others. The debrief included discussion of priorities, good decisions versus bad decisions, and how it is important to think before acting so we can make the best decision possible. LRT tied the concept of effective communication among group members to patient's support systems outside of the hospital and its benefit post discharge.   Affect/Mood: Appropriate   Participation Level: Active   Participation Quality: Independent   Behavior: Appropriate   Speech/Thought Process: Relevant   Insight: Moderate   Judgement: Moderate   Modes of Intervention: Group work   Patient Response to Interventions:  Engaged   Education Outcome:  Acknowledges education   Clinical Observations/Individualized Feedback: Pt came in as the group was compiling their joint list.  Pt was appropriate and calm during group session.  Pt suggested ideas such as a hunting dog, fishing lures, food etc.  Pt was more focused and engaged during group.     Plan: Continue to engage patient in RT group sessions  2-3x/week.   Ayden Apodaca-McCall, LRT,CTRS  01/31/2023 12:17 PM

## 2023-01-31 NOTE — [facility] Group Notes (Signed)
Adult Psychoeducational Group Note  Date:  01/31/2023 Time:  11:39 AM  Group Topic/Focus:  Goals Group:   The focus of this group is to help patients establish daily goals to achieve during treatment and discuss how the patient can incorporate goal setting into their daily lives to aide in recovery.  Participation Level:  Active  Participation Quality:  Appropriate  Affect:  Appropriate  Cognitive:  Appropriate  Insight: Appropriate  Engagement in Group:  Engaged  Modes of Intervention:  Discussion  Additional Comments:  Pt attended the goals group and remained appropriate and engaged throughout the duration of the group.   Sheran Lawless 01/31/2023, 11:39 AM

## 2023-01-31 NOTE — Progress Notes (Signed)
   01/31/23 1300  Psych Admission Type (Psych Patients Only)  Admission Status Involuntary  Psychosocial Assessment  Patient Complaints Tension;Worrying  Eye Contact Fair  Facial Expression Animated  Affect Appropriate to circumstance  Speech Pressured  Interaction Assertive  Motor Activity Fidgety  Appearance/Hygiene In scrubs  Behavior Characteristics Appropriate to situation  Mood Labile  Thought Process  Coherency Circumstantial  Content Preoccupation  Delusions None reported or observed  Perception WDL  Hallucination None reported or observed  Judgment Poor  Confusion None  Danger to Self  Current suicidal ideation? Denies  Danger to Others  Danger to Others None reported or observed

## 2023-01-31 NOTE — Group Note (Signed)
Date:  01/31/2023 Time:  9:51 PM  Group Topic/Focus:  Wrap-Up Group:   The focus of this group is to help patients review their daily goal of treatment and discuss progress on daily workbooks.    Participation Level:  Did Not Attend  Scot Dock 01/31/2023, 9:51 PM

## 2023-01-31 NOTE — Progress Notes (Signed)
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 While at [facility] patient remained irate and agitated screaming stomping his feet; he mentioned needing an injection reportedly he was on Abilify Maintena 400 mg IM but has not received it recently. He receives outpatient services at the [facility] and agreed to have his injection; however due to his continued level of agitation he was transferred to [facility] for further stabilization. While at ED pt required multiple PRN medications for continued agitation and paranoia. He was transferred to the inpatient floor 01/24/23 where upon arrival he continued to be very bizarre irritable aggressive pacing in the hallways sometimes rambling and very difficult to discern what he was saying.   On my assessment today, patient was cooperative.  Per nursing patient has been pretty irritable off and on since yesterday, requiring multiple verbal redirections, and he has been threatening to harm staff and others.  Discussed with the entire treatment team this is probably behavioral at baseline, and not due to any residual psychosis.  Discussed with the treatment team that we will consolidate out of 2 antipsychotics, get a Depakote level, and plan for discharge if we have optimized the treatment of his Axis I pathology.  On my interview today, he denied any AH, VH, paranoia and did not demonstrate any delusional thoughts during my interview with him.  Mood is less irritable than yesterday.  Anxiety level  fluctuates but remains elevated at baseline.  Sleep is okay.  Patient reports appetite is okay.  Denies any SI or HI on my exam.  He is fixated on discharge, and we discussed he could be towards the end of the week, once we consolidated down to 2 antipsychotics, and get a Depakote level.  He does agree with continue the Depakote.   Repeat EKG this morning had QTc WNL.   Pt placed on unit restrictions on 01-31-23, for safety of others in a less restrictive setting.  We will continue to assess this on a daily basis.    Principal Problem: Schizoaffective disorder, bipolar type (HCC) Diagnosis: Principal Problem:   Schizoaffective disorder, bipolar type (HCC) Active Problems:   Cannabis use disorder, severe, dependence (HCC)  Total Time spent with patient: 20 minutes  Past Psychiatric History:   Schizoaffective disorder, managed as outpatient with Abilify Maintena 400 mg IM LAI, last dose was ?12-29-22?   Past Medical History:  Past Medical History:  Diagnosis Date   Anxiety    Schizophrenia (HCC)     Past Surgical History:  Procedure Laterality Date   MANDIBLE SURGERY     WRIST SURGERY     laceration by glass that pt sts was closed with surgery   Family History: History reviewed. No pertinent family history.  Family Psychiatric  History: See H&P  Social History:  Social History   Substance and Sexual Activity  Alcohol Use Yes   Alcohol/week: 1.0 standard drink of alcohol   Types: 1 Shots of liquor per week   Comment: occasionally/ maybe once a month     Social History   Substance and Sexual Activity  Drug Use Yes   Types: Marijuana, Amphetamines, Cocaine   Comment: 1-2 zx per month    Social History   Socioeconomic History   Marital status: Single    Spouse name: Not on file   Number of children: Not on file   Years of education: Not on file   Highest education level: Not on file  Occupational History   Not on file  Tobacco Use   Smoking status: Every Day     Packs/day: 1.00    Years: 2.00    Additional pack years: 0.00    Total pack years: 2.00    Types: Cigarettes   Smokeless tobacco: Never  Vaping Use   Vaping Use: Never used  Substance and Sexual Activity   Alcohol use: Yes    Alcohol/week: 1.0 standard drink of alcohol    Types: 1 Shots of liquor per week    Comment: occasionally/ maybe once a month   Drug use: Yes    Types: Marijuana, Amphetamines, Cocaine    Comment: 1-2 zx per month   Sexual activity: Not Currently    Birth control/protection: None  Other Topics Concern   Not on file  Social History Narrative   Not on file   Social Determinants of Health   Financial Resource Strain: Not on file  Food Insecurity: Food Insecurity Present (01/24/2023)   Hunger Vital Sign    Worried About Running Out of Food in the Last Year: Sometimes true    Ran Out of Food in the Last Year: Sometimes true  Transportation Needs: Unmet Transportation Needs (01/24/2023)   PRAPARE - Administrator, Civil Service (Medical): Yes    Lack of Transportation (Non-Medical): Yes  Physical Activity: Not on file  Stress: Not on file  Social Connections: Not on file   Additional Social History:                          Current Medications: Current Facility-Administered Medications  Medication Dose Route Frequency Provider Last Rate Last Admin   acetaminophen (TYLENOL) tablet 650 mg  650 mg Oral Q6H PRN Dahlia Byes C, NP   650 mg at 01/28/23 0832   alum & mag hydroxide-simeth (MAALOX/MYLANTA) 200-200-20 MG/5ML suspension 30 mL  30 mL Oral Q4H PRN Dahlia Byes C, NP       ARIPiprazole ER (ABILIFY MAINTENA) injection 400 mg  400 mg Intramuscular Q28 days Vernette Moise, Harrold Donath, MD   400 mg at 01/26/23 1700   chlorproMAZINE (THORAZINE) injection 50 mg  50 mg Intramuscular TID PRN Krayton Wortley, Harrold Donath, MD       chlorproMAZINE (THORAZINE) tablet 50 mg  50 mg Oral TID PRN Arliss Hepburn, Harrold Donath, MD       chlorproMAZINE (THORAZINE)  tablet 75 mg  75 mg Oral Q8H Tonee Silverstein, MD   75 mg at 01/31/23 1610   cloNIDine (CATAPRES) tablet 0.2 mg  0.2 mg Oral Q8H Gertrude Bucks, MD   0.2 mg at 01/31/23 0834   diphenhydrAMINE (BENADRYL) capsule 50 mg  50 mg Oral TID PRN Dahlia Byes C, NP   50 mg at 01/29/23 1715   Or   diphenhydrAMINE (BENADRYL) injection 50 mg  50 mg Intramuscular TID PRN Dahlia Byes C, NP   50 mg at 01/27/23 0748   divalproex (DEPAKOTE) DR tablet 750 mg  750 mg Oral Q12H Shacora Zynda, Harrold Donath, MD   750 mg at 01/31/23 0835   gabapentin (NEURONTIN) capsule 600 mg  600 mg Oral  TID Loni Beckwith R, MD   600 mg at 01/31/23 0834   LORazepam (ATIVAN) tablet 2 mg  2 mg Oral TID PRN Dahlia Byes C, NP   2 mg at 01/29/23 1714   Or   LORazepam (ATIVAN) injection 2 mg  2 mg Intramuscular TID PRN Dahlia Byes C, NP   2 mg at 01/27/23 0748   magnesium hydroxide (MILK OF MAGNESIA) suspension 30 mL  30 mL Oral Daily PRN Dahlia Byes C, NP       nicotine (NICODERM CQ - dosed in mg/24 hours) patch 14 mg  14 mg Transdermal Daily Onuoha, Chinwendu V, NP   14 mg at 01/29/23 1757   OLANZapine (ZYPREXA) tablet 2.5 mg  2.5 mg Oral BH-qamhs Traveion Ruddock, MD       traZODone (DESYREL) tablet 100 mg  100 mg Oral QHS PRN Abrar Bilton, Harrold Donath, MD       traZODone (DESYREL) tablet 200 mg  200 mg Oral QHS Sonia Stickels, Harrold Donath, MD   200 mg at 01/30/23 2102   valbenazine (INGREZZA) capsule 40 mg  40 mg Oral Daily Dahlia Byes C, NP   40 mg at 01/31/23 1610    Lab Results:  No results found for this or any previous visit (from the past 48 hour(s)).   Blood Alcohol level:  Lab Results  Component Value Date   ETH <10 01/23/2023   ETH <5 10/17/2014    Metabolic Disorder Labs: Lab Results  Component Value Date   HGBA1C 5.7 (H) 01/28/2023   MPG 116.89 01/28/2023   MPG 114 10/21/2014   Lab Results  Component Value Date   PROLACTIN 0.8 (L) 08/04/2022   Lab Results  Component Value Date   CHOL  137 01/28/2023   TRIG 204 (H) 01/28/2023   HDL 48 01/28/2023   CHOLHDL 2.9 01/28/2023   VLDL 41 (H) 01/28/2023   LDLCALC 48 01/28/2023   LDLCALC 74 08/04/2022    Physical Findings: AIMS: Facial and Oral Movements Muscles of Facial Expression: None, normal Lips and Perioral Area: None, normal Jaw: None, normal Tongue: None, normal,Extremity Movements Upper (arms, wrists, hands, fingers): None, normal Lower (legs, knees, ankles, toes): None, normal, Trunk Movements Neck, shoulders, hips: None, normal, Overall Severity Severity of abnormal movements (highest score from questions above): None, normal Incapacitation due to abnormal movements: None, normal Patient's awareness of abnormal movements (rate only patient's report): No Awareness, Dental Status Current problems with teeth and/or dentures?: No Does patient usually wear dentures?: No  CIWA:    COWS:     Musculoskeletal: Strength & Muscle Tone: within normal limits Gait & Station: normal Patient leans: N/A  Psychiatric Specialty Exam:  Presentation  General Appearance:  Disheveled  Eye Contact: Fair  Speech: Normal Rate  Speech Volume: Normal  Handedness: Right   Mood and Affect  Mood: Anxious; Irritable  Affect: Labile   Thought Process  Thought Processes: Goal Directed  Descriptions of Associations:Intact  Orientation:Full (Time, Place and Person)  Thought Content: Logical  History of Schizophrenia/Schizoaffective disorder:Yes  Duration of Psychotic Symptoms:Greater than six months  Hallucinations:Hallucinations: None  Ideas of Reference:-- (denies)  Suicidal Thoughts:Suicidal Thoughts: No  Homicidal Thoughts:Homicidal Thoughts: No   Sensorium  Memory: Immediate Good; Recent Good; Remote Good  Judgment: Impaired  Insight: Lacking   Executive Functions  Concentration: Poor  Attention Span: Poor  Recall: [employer] of  Knowledge: Fair  Language: Fair   Psychomotor Activity  Psychomotor Activity: Psychomotor Activity: Normal   Assets  Assets: Manufacturing systems engineer;  Physical Health; Resilience   Sleep  Sleep: Sleep: Fair    Physical Exam: Physical Exam Vitals reviewed.  Pulmonary:     Effort: Pulmonary effort is normal.  Neurological:     Mental Status: He is alert.     Motor: No weakness.     Gait: Gait normal.    Review of Systems  Constitutional:  Negative for chills and fever.  Cardiovascular:  Negative for chest pain and palpitations.  Neurological:  Negative for dizziness, tingling, tremors and headaches.  Psychiatric/Behavioral:  Positive for substance abuse. Negative for depression, hallucinations, memory loss and suicidal ideas. The patient is nervous/anxious. The patient does not have insomnia.   All other systems reviewed and are negative.  Blood pressure (!) 158/91, pulse 82, temperature 98.1 F (36.7 C), temperature source Oral, resp. rate 20, height 6' (1.829 m), weight 86.2 kg, SpO2 100 %. Body mass index is 25.77 kg/m.   Treatment Plan Summary: Daily contact with patient to assess and evaluate symptoms and progress in treatment and Medication management  Patient is overall improvement since he was admitted to this unit 5 days ago.  He does still does have mood lability and irritability, threatening violence toward staff.  Sleep is much better.  Today I will increase the Thorazine, and decrease Zyprexa, and attempt to consolidate antipsychotics to a total number of 2.  We will also start Depakote for mood stabilization.  I will also taper off of the Ativan, if this is disinhibiting.  Diagnoses / Active Problems: -Schizoaffective disorder bipolar type -GAD -Rule out TD on Ingrezza, no current TD symptoms, recently started Ingrezza   PLAN: Safety and Monitoring:             -- Involuntary admission to inpatient psychiatric unit for safety, stabilization and  treatment             -- Daily contact with patient to assess and evaluate symptoms and progress in treatment             -- Patient's case to be discussed in multi-disciplinary team meeting             -- Observation Level : q15 minute checks             -- Vital signs:  q12 hours             -- Precautions: suicide, elopement, and assault   2. Psychiatric Diagnoses and Treatment:               -Decrease Zyprexa to 2.5 mg q12H for 2 doses, then STOP -  for psychosis and mood stabilziation.  -Previously stopped Cogentin due to concern for TD -Continue Ingrezza 40 mg once daily for TD, started as outpatient on 12/29/2022 -Increase clonidine 0.2 mg from bid to tid - for HTN and impulse control -Previously administered Abilify Maintena 400 mg LAI on 01-26-23.  -Previously stopped Haldol as needed for agitation from 5 mg to 10 mg PRN (PO or IM)  -Continue Thorazine 50 mg tid prn PO or IM for agitation (replacing haldol) -Continue thorazine 75 mg q8H scheduled for psychosis, mood stabilization, and aggression  -Stop ativan  -Continue Depakote DR 500 mg bid - for mood stabilization  -Continue trazodone 200 mg nightly for insomnia, plus 100 mg qhs prn      --  The risks/benefits/side-effects/alternatives to this medication were discussed in detail with the patient and time was given for questions. The patient consents to medication trial.                --  Metabolic profile and EKG monitoring obtained while on an atypical antipsychotic (BMI: Lipid Panel: HbgA1c: QTc:)              -- Encouraged patient to participate in unit milieu and in scheduled group therapies                              3. Medical Issues Being Addressed:              Tobacco Use Disorder             -- Nicotine patch 14 mg/24 hours ordered             -- Smoking cessation encouraged   4. Discharge Planning:              -- Social work and case management to assist with discharge planning and identification of hospital  follow-up needs prior to discharge             -- Estimated LOS: 4-5 more days             -- Discharge Concerns: Need to establish a safety plan; Medication compliance and effectiveness             -- Discharge Goals: Return home with outpatient referrals for mental health follow-up including medication management/psychotherapy       Cristy Hilts, MD 01/31/2023, 12:26 PM  Total Time Spent in Direct Patient Care:  I personally spent 35 minutes on the unit in direct patient care. The direct patient care time included face-to-face time with the patient, reviewing the patient's chart, communicating with other professionals, and coordinating care. Greater than 50% of this time was spent in counseling or coordinating care with the patient regarding goals of hospitalization, psycho-education, and discharge planning needs.   Phineas Inches, MD Psychiatrist

## 2023-02-01 MED ORDER — CHLORPROMAZINE HCL 100 MG PO TABS
100.0000 mg | ORAL_TABLET | Freq: Three times a day (TID) | ORAL | Status: DC
  Administered 2023-02-01 – 2023-02-02 (×3): 100 mg via ORAL
  Filled 2023-02-01 (×6): qty 1

## 2023-02-01 MED ORDER — TRAZODONE HCL 150 MG PO TABS
300.0000 mg | ORAL_TABLET | Freq: Every day | ORAL | Status: DC
  Administered 2023-02-01: 300 mg via ORAL
  Filled 2023-02-01 (×2): qty 2

## 2023-02-01 NOTE — Progress Notes (Signed)
   01/31/23 2038  Psych Admission Type (Psych Patients Only)  Admission Status Involuntary  Psychosocial Assessment  Patient Complaints None  Eye Contact Fair  Facial Expression Animated  Affect Appropriate to circumstance  Speech Pressured  Interaction Assertive  Motor Activity Fidgety  Appearance/Hygiene In scrubs  Behavior Characteristics Cooperative;Appropriate to situation  Mood Pleasant  Thought Process  Coherency Circumstantial  Content Preoccupation  Delusions None reported or observed  Perception WDL  Hallucination None reported or observed  Judgment Poor  Confusion None  Danger to Self  Current suicidal ideation? Denies  Danger to Others  Danger to Others None reported or observed   Pt denies SI/HI/AVH. Pt is pleasant and cooperative. Pt was offered support and encouragement. Pt was given scheduled medications and PRN Trazodone per MAR. Pt was encourage to attend groups but did not attend group. Q 15 minute checks were done for safety. Pt interacts well with peers and staff. Pt has no complaints.Pt receptive to treatment and safety maintained on unit.

## 2023-02-01 NOTE — Group Note (Signed)
Date:  02/01/2023 Time:  10:28 AM  Group Topic/Focus:  Goals Group:   The focus of this group is to help patients establish daily goals to achieve during treatment and discuss how the patient can incorporate goal setting into their daily lives to aide in recovery. Orientation:   The focus of this group is to educate the patient on the purpose and policies of crisis stabilization and provide a format to answer questions about their admission.  The group details unit policies and expectations of patients while admitted.    Participation Level:  Active  Participation Quality:  Attentive  Affect:  Appropriate  Cognitive:  Appropriate  Insight: Appropriate  Engagement in Group:  Engaged  Modes of Intervention:  Discussion  Additional Comments:  Patient attended group and was attentive the duration of it. Patient's goal was to attend all groups and take all of his medications.   Maryann Mccall T Kortlynn Poust 02/01/2023, 10:28 AM

## 2023-02-01 NOTE — Group Note (Signed)
Recreation Therapy Group Note   Group Topic:Problem Solving  Group Date: 02/01/2023 Start Time: 1000 End Time: 1030 Facilitators: Keturah Yerby-McCall, LRT,CTRS Location: [address] Dayroom   Goal Area(s) Addresses:  Patient will effectively work with peer towards shared goal.  Patient will identify skills used to make activity successful.  Patient will identify how skills used during activity can be applied to reach post d/c goals.   Group Description: [employer]. In teams of 5-6, patients were given 11 craft pipe cleaners. Using the materials provided, patients were instructed to compete again the opposing team(s) to build the tallest free-standing structure from floor level. The activity was timed; difficulty increased by Clinical research associate as Production designer, theatre/television/film continued.  Systematically resources were removed with additional directions for example, placing one arm behind their back, working in silence, and shape stipulations. LRT facilitated post-activity discussion reviewing team processes and necessary communication skills involved in completion. Patients were encouraged to reflect how the skills utilized, or not utilized, in this activity can be incorporated to positively impact support systems post discharge.   Affect/Mood: N/A   Participation Level: Did not attend    Clinical Observations/Individualized Feedback:    Plan: Continue to engage patient in RT group sessions 2-3x/week.   Raysha Tilmon-McCall, LRT,CTRS  02/01/2023 11:48 AM

## 2023-02-01 NOTE — Progress Notes (Signed)
   02/01/23 0800  Psych Admission Type (Psych Patients Only)  Admission Status Involuntary  Psychosocial Assessment  Patient Complaints None  Eye Contact Fair  Facial Expression Animated  Affect Appropriate to circumstance  Speech Pressured  Interaction Assertive  Motor Activity Fidgety  Appearance/Hygiene In scrubs  Behavior Characteristics Cooperative;Appropriate to situation  Mood Pleasant  Aggressive Behavior  Effect No apparent injury  Thought Process  Coherency Circumstantial  Content Preoccupation  Delusions None reported or observed  Perception WDL  Hallucination None reported or observed  Judgment Poor  Confusion None  Danger to Self  Current suicidal ideation? Denies  Danger to Others  Danger to Others None reported or observed

## 2023-02-01 NOTE — Progress Notes (Signed)
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 While at [facility] patient remained irate and agitated screaming stomping his feet; he mentioned needing an injection reportedly he was on Abilify Maintena 400 mg IM but has not received it recently. He receives outpatient services at the [facility] and agreed to have his injection; however due to his continued level of agitation he was transferred to [facility] for further stabilization. While at ED pt required multiple forced medications for continued agitation and paranoia. He was transferred to the inpatient floor 01/24/23 where upon arrival he continued to be very bizarre irritable aggressive pacing in the hallways sometimes rambling and very difficult to discern what he was saying.   Mood assessment today, patient is much more calm.  Per nursing, patient does not demonstrate any aggressive behaviors or made any threatening comments over the past 24 hours.  He denies any side effects to medications.  Reports that anxiety is less.  Mood is okay and not depressed.  Reports that sleep was not as good last night, but when he received the extra dose of trazodone as needed, he was able to fall asleep.  We discussed we are taking Zyprexa off slowly, and due to this, the patient is agreeable with increasing the Thorazine dose, and increasing the trazodone dose, in order to supplement for the sedating effects of Zyprexa.  Otherwise mood seems more stable, and the patient agrees.  Denies any SI or HI.   We discussed we will get EKG, LFT, and Depakote level in the morning.  We discussed that during the patient's continued response to medication changes, and laboratory results, discharge will be considered as early as tomorrow, 5/16.  Unit restrictions discontinued.   Principal Problem: Schizoaffective disorder, bipolar type (HCC) Diagnosis: Principal Problem:   Schizoaffective disorder, bipolar type (HCC) Active Problems:   Cannabis use disorder, severe, dependence (HCC)  Total Time spent with patient: 20 minutes  Past Psychiatric History:   Schizoaffective disorder, managed as outpatient with Abilify Maintena 400 mg IM LAI, last dose was ?12-29-22?   Past Medical History:  Past Medical History:  Diagnosis Date   Anxiety    Schizophrenia (HCC)     Past Surgical History:  Procedure Laterality Date   MANDIBLE SURGERY     WRIST SURGERY     laceration by glass that pt sts was closed with surgery   Family History: History reviewed. No pertinent family history.  Family Psychiatric  History: See H&P  Social History:  Social History   Substance and Sexual Activity  Alcohol Use Yes   Alcohol/week: 1.0 standard drink of alcohol   Types: 1 Shots of liquor per week   Comment: occasionally/ maybe once a month     Social History   Substance and Sexual Activity  Drug Use Yes   Types: Marijuana, Amphetamines, Cocaine   Comment: 1-2 zx per month    Social History   Socioeconomic History   Marital status: Single    Spouse name: Not on file   Number of children: Not on file   Years of  education: Not on file   Highest education level: Not on file  Occupational History   Not on file  Tobacco Use   Smoking status: Every Day    Packs/day: 1.00    Years: 2.00    Additional pack years: 0.00    Total pack years: 2.00    Types: Cigarettes   Smokeless tobacco: Never  Vaping Use   Vaping Use: Never used  Substance and Sexual Activity   Alcohol use: Yes    Alcohol/week: 1.0  standard drink of alcohol    Types: 1 Shots of liquor per week    Comment: occasionally/ maybe once a month   Drug use: Yes    Types: Marijuana, Amphetamines, Cocaine    Comment: 1-2 zx per month   Sexual activity: Not Currently    Birth control/protection: None  Other Topics Concern   Not on file  Social History Narrative   Not on file   Social Determinants of Health   Financial Resource Strain: Not on file  Food Insecurity: Food Insecurity Present (01/24/2023)   Hunger Vital Sign    Worried About Running Out of Food in the Last Year: Sometimes true    Ran Out of Food in the Last Year: Sometimes true  Transportation Needs: Unmet Transportation Needs (01/24/2023)   PRAPARE - Administrator, Civil Service (Medical): Yes    Lack of Transportation (Non-Medical): Yes  Physical Activity: Not on file  Stress: Not on file  Social Connections: Not on file   Additional Social History:                          Current Medications: Current Facility-Administered Medications  Medication Dose Route Frequency Provider Last Rate Last Admin   acetaminophen (TYLENOL) tablet 650 mg  650 mg Oral Q6H PRN Dahlia Byes C, NP   650 mg at 01/28/23 0832   alum & mag hydroxide-simeth (MAALOX/MYLANTA) 200-200-20 MG/5ML suspension 30 mL  30 mL Oral Q4H PRN Dahlia Byes C, NP       ARIPiprazole ER (ABILIFY MAINTENA) injection 400 mg  400 mg Intramuscular Q28 days Charisma Charlot, Harrold Donath, MD   400 mg at 01/26/23 1700   chlorproMAZINE (THORAZINE) injection 50 mg  50 mg Intramuscular TID PRN Alora Gorey, Harrold Donath, MD       chlorproMAZINE (THORAZINE) tablet 100 mg  100 mg Oral Q8H Laconda Basich, MD       chlorproMAZINE (THORAZINE) tablet 50 mg  50 mg Oral TID PRN Charleston Vierling, Harrold Donath, MD       cloNIDine (CATAPRES) tablet 0.2 mg  0.2 mg Oral Q8H Benjaman Artman, MD   0.2 mg at 02/01/23 0601   diphenhydrAMINE (BENADRYL) capsule 50 mg  50 mg Oral TID PRN Dahlia Byes C, NP   50 mg  at 01/29/23 1715   Or   diphenhydrAMINE (BENADRYL) injection 50 mg  50 mg Intramuscular TID PRN Dahlia Byes C, NP   50 mg at 01/27/23 0748   divalproex (DEPAKOTE) DR tablet 750 mg  750 mg Oral Q12H Zalaya Astarita, Harrold Donath, MD   750 mg at 02/01/23 0826   gabapentin (NEURONTIN) capsule 600 mg  600 mg Oral TID Loni Beckwith R, MD   600 mg at 02/01/23 0827   LORazepam (ATIVAN) tablet 2 mg  2 mg Oral TID PRN Dahlia Byes C, NP   2 mg at 01/29/23 1714   Or   LORazepam (ATIVAN) injection 2 mg  2 mg Intramuscular TID PRN Welford Roche,  Hessie Dibble, NP   2 mg at 01/27/23 0748   magnesium hydroxide (MILK OF MAGNESIA) suspension 30 mL  30 mL Oral Daily PRN Dahlia Byes C, NP       nicotine (NICODERM CQ - dosed in mg/24 hours) patch 14 mg  14 mg Transdermal Daily Onuoha, Chinwendu V, NP   14 mg at 01/29/23 1757   traZODone (DESYREL) tablet 100 mg  100 mg Oral QHS PRN Dedric Ethington, Harrold Donath, MD   100 mg at 01/31/23 2317   traZODone (DESYREL) tablet 300 mg  300 mg Oral QHS Charliene Inoue, MD       valbenazine [facility]) capsule 40 mg  40 mg Oral Daily Dahlia Byes C, NP   40 mg at 02/01/23 1610    Lab Results:  No results found for this or any previous visit (from the past 48 hour(s)).   Blood Alcohol level:  Lab Results  Component Value Date   ETH <10 01/23/2023   ETH <5 10/17/2014    Metabolic Disorder Labs: Lab Results  Component Value Date   HGBA1C 5.7 (H) 01/28/2023   MPG 116.89 01/28/2023   MPG 114 10/21/2014   Lab Results  Component Value Date   PROLACTIN 0.8 (L) 08/04/2022   Lab Results  Component Value Date   CHOL 137 01/28/2023   TRIG 204 (H) 01/28/2023   HDL 48 01/28/2023   CHOLHDL 2.9 01/28/2023   VLDL 41 (H) 01/28/2023   LDLCALC 48 01/28/2023   LDLCALC 74 08/04/2022    Physical Findings: AIMS: Facial and Oral Movements Muscles of Facial Expression: None, normal Lips and Perioral Area: None, normal Jaw: None, normal Tongue: None, normal,Extremity  Movements Upper (arms, wrists, hands, fingers): None, normal Lower (legs, knees, ankles, toes): None, normal, Trunk Movements Neck, shoulders, hips: None, normal, Overall Severity Severity of abnormal movements (highest score from questions above): None, normal Incapacitation due to abnormal movements: None, normal Patient's awareness of abnormal movements (rate only patient's report): No Awareness, Dental Status Current problems with teeth and/or dentures?: No Does patient usually wear dentures?: No  CIWA:    COWS:     Musculoskeletal: Strength & Muscle Tone: within normal limits Gait & Station: normal Patient leans: N/A  Psychiatric Specialty Exam:  Presentation  General Appearance:  Casual; Fairly Groomed  Eye Contact: Good  Speech: Normal Rate  Speech Volume: Normal  Handedness: Right   Mood and Affect  Mood: Anxious  Affect: Full Range; Congruent   Thought Process  Thought Processes: Linear  Descriptions of Associations:Intact  Orientation:Full (Time, Place and Person)  Thought Content:Logical  History of Schizophrenia/Schizoaffective disorder:Yes  Duration of Psychotic Symptoms:Greater than six months  Hallucinations:Hallucinations: None  Ideas of Reference:None  Suicidal Thoughts:Suicidal Thoughts: No  Homicidal Thoughts:Homicidal Thoughts: No   Sensorium  Memory: Immediate Good; Recent Good; Remote Good  Judgment: Fair  Insight: Fair   Art therapist  Concentration: Fair  Attention Span: Fair  Recall: Good  Fund of Knowledge: Good  Language: Good   Psychomotor Activity  Psychomotor Activity: Psychomotor Activity: Normal   Assets  Assets: Communication Skills; Physical Health; Resilience   Sleep  Sleep: Sleep: Good    Physical Exam: Physical Exam Vitals reviewed.  Constitutional:      General: He is not in acute distress.    Appearance: He is normal weight.  Pulmonary:     Effort:  Pulmonary effort is normal.  Neurological:     Mental Status: He is alert.     Motor: No weakness.  Gait: Gait normal.  Psychiatric:        Mood and Affect: Mood normal.        Behavior: Behavior normal.        Thought Content: Thought content normal.        Judgment: Judgment normal.    Review of Systems  Constitutional:  Negative for chills and fever.  Cardiovascular:  Negative for chest pain and palpitations.  Neurological:  Negative for dizziness, tingling, tremors and headaches.  Psychiatric/Behavioral:  Positive for substance abuse. Negative for depression, hallucinations, memory loss and suicidal ideas. The patient is nervous/anxious. The patient does not have insomnia.   All other systems reviewed and are negative.  Blood pressure (!) 141/79, pulse 78, temperature 98.3 F (36.8 C), temperature source Oral, resp. rate 20, height 6' (1.829 m), weight 86.2 kg, SpO2 100 %. Body mass index is 25.77 kg/m.   Treatment Plan Summary: Daily contact with patient to assess and evaluate symptoms and progress in treatment and Medication management  Patient is overall improvement since he was admitted to this unit 5 days ago.  He does still does have mood lability and irritability, threatening violence toward staff.  Sleep is much better.  Today I will increase the Thorazine, and decrease Zyprexa, and attempt to consolidate antipsychotics to a total number of 2.  We will also start Depakote for mood stabilization.  I will also taper off of the Ativan, if this is disinhibiting.  Diagnoses / Active Problems: -Schizoaffective disorder bipolar type -GAD -Rule out TD on Ingrezza, no current TD symptoms, recently started Ingrezza   PLAN: Safety and Monitoring:             -- Involuntary admission to inpatient psychiatric unit for safety, stabilization and treatment             -- Daily contact with patient to assess and evaluate symptoms and progress in treatment             -- Patient's  case to be discussed in multi-disciplinary team meeting             -- Observation Level : q15 minute checks             -- Vital signs:  q12 hours             -- Precautions: suicide, elopement, and assault   2. Psychiatric Diagnoses and Treatment:               -D/c Zyprexa (taper to 2 antipsychotics total)   -Previously stopped Cogentin due to concern for TD -Continue Ingrezza 40 mg once daily for TD, started as outpatient on 12/29/2022 -Continue clonidine 0.2 mg every 8 hours for HTN and impulse control -Previously administered Abilify Maintena 400 mg LAI on 01-26-23.  -Previously Stop Haldol as needed for agitation from 5 mg to 10 mg PRN (PO or IM)  -Continue Thorazine 50 mg tid prn PO or IM for agitation (replacing haldol and zyprexa) -order EKG -Increase thorazine from 75 mg q8H -> to 100 mg q8H scheduled for psychosis, mood stabilization, and aggression  -Previously d/c Ativan  -Continue Depakote DR 750 mg bid - for mood stabilization  -Increase trazodone from 200 mg to 300 mg nightly for insomnia, plus 100 mg qhs prn    -order lft, depakote level, and EKG for 5/16    --  The risks/benefits/side-effects/alternatives to this medication were discussed in detail with the patient and time was given for questions. The patient consents  to medication trial.                -- Metabolic profile and EKG monitoring obtained while on an atypical antipsychotic (BMI: Lipid Panel: HbgA1c: QTc:)              -- Encouraged patient to participate in unit milieu and in scheduled group therapies                              3. Medical Issues Being Addressed:              Tobacco Use Disorder             -- Nicotine patch 14 mg/24 hours ordered             -- Smoking cessation encouraged   4. Discharge Planning:              -- Social work and case management to assist with discharge planning and identification of hospital follow-up needs prior to discharge             -- Estimated LOS: as early  as 5/16              -- Discharge Concerns: Need to establish a safety plan; Medication compliance and effectiveness             -- Discharge Goals: Return home with outpatient referrals for mental health follow-up including medication management/psychotherapy       Cristy Hilts, MD 02/01/2023, 10:26 AM  Total Time Spent in Direct Patient Care:  I personally spent 35 minutes on the unit in direct patient care. The direct patient care time included face-to-face time with the patient, reviewing the patient's chart, communicating with other professionals, and coordinating care. Greater than 50% of this time was spent in counseling or coordinating care with the patient regarding goals of hospitalization, psycho-education, and discharge planning needs.   Phineas Inches, MD Psychiatrist

## 2023-02-01 NOTE — Progress Notes (Signed)
Psychoeducational Group Note  Date:  02/01/2023 Time:  2027  Group Topic/Focus:  Wrap-Up Group:   The focus of this group is to help patients review their daily goal of treatment and discuss progress on daily workbooks.  Participation Level: Did Not Attend  Participation Quality:  Not Applicable  Affect:  Not Applicable  Cognitive:  Not Applicable  Insight:  Not Applicable  Engagement in Group: Not Applicable  Additional Comments:  The patient did not attend group this evening.   Hazle Coca S 02/01/2023, 8:27 PM

## 2023-02-02 LAB — HEPATIC FUNCTION PANEL
ALT: 43 U/L (ref 0–44)
AST: 35 U/L (ref 15–41)
Albumin: 3.9 g/dL (ref 3.5–5.0)
Alkaline Phosphatase: 77 U/L (ref 38–126)
Bilirubin, Direct: <0.1 mg/dL (ref 0.0–0.2)
Total Bilirubin: 0.4 mg/dL (ref 0.3–1.2)
Total Protein: 7.3 g/dL (ref 6.5–8.1)

## 2023-02-02 LAB — VALPROIC ACID LEVEL: Valproic Acid Lvl: 90 ug/mL (ref 50.0–100.0)

## 2023-02-02 MED ORDER — DIVALPROEX SODIUM 250 MG PO DR TAB: 750.0000 mg | DELAYED_RELEASE_TABLET | Freq: Two times a day (BID) | ORAL | 0 refills | Status: AC

## 2023-02-02 MED ORDER — CLONIDINE HCL 0.2 MG PO TABS: 0.2000 mg | ORAL_TABLET | Freq: Three times a day (TID) | ORAL | 0 refills | Status: DC

## 2023-02-02 MED ORDER — CHLORPROMAZINE HCL 100 MG PO TABS: 100.0000 mg | ORAL_TABLET | Freq: Three times a day (TID) | ORAL | 0 refills | Status: DC

## 2023-02-02 MED ORDER — ARIPIPRAZOLE ER 400 MG IM SRER: 400.0000 mg | INTRAMUSCULAR | 0 refills | Status: DC

## 2023-02-02 MED ORDER — CLONIDINE HCL 0.2 MG PO TABS: 0.2000 mg | ORAL_TABLET | Freq: Three times a day (TID) | ORAL | 0 refills | Status: AC

## 2023-02-02 MED ORDER — NICOTINE 14 MG/24HR TD PT24: 14.0000 mg | MEDICATED_PATCH | Freq: Every day | TRANSDERMAL | 0 refills | Status: AC

## 2023-02-02 MED ORDER — TRAZODONE HCL 300 MG PO TABS: 300.0000 mg | ORAL_TABLET | Freq: Every day | ORAL | 0 refills | Status: AC

## 2023-02-02 MED ORDER — GABAPENTIN 300 MG PO CAPS: 600.0000 mg | ORAL_CAPSULE | Freq: Three times a day (TID) | ORAL | 0 refills | Status: AC

## 2023-02-02 MED ORDER — GABAPENTIN 300 MG PO CAPS: 600.0000 mg | ORAL_CAPSULE | Freq: Three times a day (TID) | ORAL | 0 refills | Status: DC

## 2023-02-02 MED ORDER — ARIPIPRAZOLE ER 400 MG IM SRER: 400.0000 mg | INTRAMUSCULAR | 0 refills | Status: AC

## 2023-02-02 MED ORDER — VALBENAZINE TOSYLATE 40 MG PO CAPS
40.0000 mg | ORAL_CAPSULE | Freq: Every day | ORAL | 0 refills | Status: AC
Start: 2023-02-02 — End: 2023-03-04

## 2023-02-02 MED ORDER — DIVALPROEX SODIUM 250 MG PO DR TAB: 750.0000 mg | DELAYED_RELEASE_TABLET | Freq: Two times a day (BID) | ORAL | 0 refills | Status: DC

## 2023-02-02 MED ORDER — CHLORPROMAZINE HCL 100 MG PO TABS: 100.0000 mg | ORAL_TABLET | Freq: Three times a day (TID) | ORAL | 0 refills | Status: AC

## 2023-02-02 MED ORDER — VALBENAZINE TOSYLATE 40 MG PO CAPS
40.0000 mg | ORAL_CAPSULE | Freq: Every day | ORAL | 0 refills | Status: DC
Start: 2023-02-02 — End: 2023-02-02

## 2023-02-02 MED ORDER — TRAZODONE HCL 300 MG PO TABS: 300.0000 mg | ORAL_TABLET | Freq: Every day | ORAL | 0 refills | Status: DC

## 2023-02-02 NOTE — Discharge Instructions (Signed)
-  Follow-up with your outpatient psychiatric provider -instructions on appointment date, time, and address (location) are provided to you in discharge paperwork.  -Take your psychiatric medications as prescribed at discharge - instructions are provided to you in the discharge paperwork  -Follow-up with outpatient primary care doctor and other specialists -for management of preventative medicine and any chronic medical disease.  -Recommend abstinence from alcohol, tobacco, and other illicit drug use at discharge.   -If your psychiatric symptoms recur, worsen, or if you have side effects to your psychiatric medications, call your outpatient psychiatric provider, 911, [phone] or go to the nearest emergency department.  -If suicidal thoughts occur, call your outpatient psychiatric provider, 911, [phone] or go to the nearest emergency department.  Naloxone (Narcan) can help reverse an overdose when given to the victim quickly.  [place] offers free naloxone kits and instructions/training on its use.  Add naloxone to your first aid kit and you can help save a life.   Pick up your free kit at the following locations:   [place]:  [facility], [address] [place] [address] ([phone]) [facility] [address] [place] [id number] ([phone]) [facility] [address] [place] [address]  [place]: [facility] [address] [place] [address] ([phone]) [facility] [address] [place] [address] ([phone])  

## 2023-02-02 NOTE — Progress Notes (Signed)
  [facility] Adult Case Management Discharge Plan :  Will you be returning to the same living situation after discharge:  Yes,  shelter At discharge, do you have transportation home?: Yes,  bus pass Do you have the ability to pay for your medications: Yes,  insurance   Release of information consent forms completed and in the chart;  Patient's signature needed at discharge.  Patient to Follow up at:  Follow-up Information     [facility] Follow up.   Specialty: Behavioral Health Why: Please go to your provider for med managment on May 30th at 10:30am. Contact information: [address] [place] [address] [phone]                Next level of care provider has access to [facility] Link:yes  Safety Planning and Suicide Prevention discussed: No. With patient, patient declined consents      Has patient been referred to the Quitline?: Patient refused referral for treatment  Patient has been referred for addiction treatment: Patient refused referral for treatment.  Bodey Frizell E Fendi Meinhardt, LCSW 02/02/2023, 9:40 AM

## 2023-02-02 NOTE — Group Note (Unsigned)
Recreation Therapy Group Note   Group Topic:Health and Wellness  Group Date: 02/02/2023 Start Time: 0955 End Time: 1030 Facilitators: Dorma Altman-McCall, Ahyan Kreeger A, NT Location: [address] Dayroom       Affect/Mood: {RT BHH Affect/Mood:26271}   Participation Level: {RT BHH Participation [employer]   Participation Quality: {RT BHH Participation Quality:26268}   Behavior: {RT BHH Group Behavior:26269}   Speech/Thought Process: {RT BHH Speech/Thought:26276}   Insight: {RT BHH Insight:26272}   Judgement: {RT BHH Judgement:26278}   Modes of Intervention: {RT BHH Modes of Intervention:26277}   Patient Response to Interventions:  {RT BHH Patient Response to Intervention:26274}   Education Outcome:  {RT BHH Education Outcome:26279}   Clinical Observations/Individualized Feedback: *** was *** in their participation of session activities and group discussion. Pt identified ***   Plan: {RT BHH Tx Plan:26280}   Citlally Captain A Mychelle Kendra-McCall, NT,  02/02/2023 10:29 AM

## 2023-02-02 NOTE — Progress Notes (Signed)
   02/01/23 2020  Psych Admission Type (Psych Patients Only)  Admission Status Involuntary  Psychosocial Assessment  Patient Complaints None  Eye Contact Fair  Facial Expression Animated  Affect Appropriate to circumstance  Speech Pressured  Interaction Assertive  Motor Activity Fidgety  Appearance/Hygiene In scrubs  Behavior Characteristics Cooperative;Appropriate to situation  Thought Process  Coherency Circumstantial  Content Preoccupation  Delusions None reported or observed  Perception WDL  Hallucination None reported or observed  Judgment Poor  Confusion None  Danger to Self  Current suicidal ideation? Denies  Danger to Others  Danger to Others None reported or observed   Pt reports 0/10 anxiety and depression. Pt is pleasant and cooperative. Pt was offered support and encouragement. Pt was given scheduled medications. Pt was encouraged to attend group but did not attend group. Q 15 minute checks were done for safety. Interacts well with peers and staff. Pt has no complaints.Pt receptive to treatment and safety maintained on unit.

## 2023-02-02 NOTE — [facility] Suicide Risk Assessment (Signed)
[facility] INPATIENT:  Family/Significant Other Suicide Prevention Education  Suicide Prevention Education:  Patient Refusal for Family/Significant Other Suicide Prevention Education: The patient Vincent [name] has refused to provide written consent for family/significant other to be provided Family/Significant Other Suicide Prevention Education during admission and/or prior to discharge.  Physician notified.  Kiauna Zywicki E Dareth Andrew 02/02/2023, 9:46 AM

## 2023-02-02 NOTE — [facility] Suicide Risk Assessment (Signed)
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 While at [facility] patient remained irate and agitated screaming stomping his feet; he mentioned needing an injection reportedly he was on Abilify Maintena 400 mg IM but has not received it recently. He receives outpatient services at the [facility] and agreed to have his injection; however due to his continued level of agitation he was transferred to [facility] for further stabilization. While at ED pt required multiple forced medications for continued agitation and paranoia. He was transferred to the inpatient floor 01/24/23 where upon arrival he continued to be very bizarre irritable aggressive pacing in the hallways sometimes rambling and very difficult to discern what he was saying.   Psychiatric diagnoses provided upon initial [name]:  -Schizoaffective disorder bipolar type -GAD -Rule out TD on Ingrezza, no current TD symptoms, recently started Ingrezza   Patient's psychiatric medications were adjusted on admission:  Will discontinue Abilify 5 mg and start him on Zyprexa 10 twice daily for 4 to 5 days    During the hospitalization, other adjustments were made to the patient's psychiatric medication regimen:  -Abilify maintena 400 mg LAI was administered on  01-26-23. -zyprexa was incr to 10 mg bid, but then tapered off to reduce polypharmacy (transitioned to thorazine) -haldol was started but then tapered off to reduce polypharmacy (transitioned to thorazine) -thorazine was started and increased to 100 mg tid  -depakote was started, increased to DR 750 mg bid. VA level on 02-02-23 was: 46   # It is recommended to the patient to continue psychiatric medications as prescribed, after discharge from the hospital.     # It is recommended to the patient to follow up with your outpatient psychiatric provider and PCP.   # It was discussed with the patient, the impact of alcohol, drugs, tobacco have been there overall psychiatric and medical wellbeing, and total abstinence from substance use was recommended the patient.ed.   # Prescriptions provided or sent directly to preferred pharmacy at discharge. Patient agreeable to plan. Given opportunity to ask questions. Appears to feel comfortable with discharge.    # In the event of worsening symptoms, the patient is instructed to call the crisis hotline, 911 and or go to the nearest ED for appropriate evaluation and treatment of symptoms. To follow-up with primary care provider for other medical issues, concerns and or health care needs   # Patient was discharged to [facility], with a plan to follow up as noted below.   Psychiatric Specialty Exam  Presentation  General Appearance:  Appropriate for Environment; Casual; Fairly Groomed  Eye Contact: Good  Speech: Normal Rate; Clear and Coherent  Speech Volume: Normal  Handedness: Right   Mood and Affect  Mood: Euthymic  Duration of Depression Symptoms: No data recorded Affect: Appropriate; Congruent; Full Range   Thought Process  Thought Processes: Linear  Descriptions of Associations:Intact  Orientation:Full (Time, Place and Person)  Thought Content:Logical  History  of Schizophrenia/Schizoaffective disorder:Yes  Duration of Psychotic  Symptoms:Greater than six months  Hallucinations:Hallucinations: None  Ideas of Reference:None  Suicidal Thoughts:Suicidal Thoughts: No  Homicidal Thoughts:Homicidal Thoughts: No   Sensorium  Memory: Immediate Good; Recent Good; Remote Good  Judgment: Fair  Insight: Fair   Art therapist  Concentration: Fair  Attention Span: Fair  Recall: Good  Fund of Knowledge: Good  Language: Good   Psychomotor Activity  Psychomotor Activity: Psychomotor Activity: Normal   Assets  Assets: Communication Skills; Physical Health; Resilience   Sleep  Sleep: Sleep: Fair   Physical Exam: Physical Exam ROS Blood pressure 132/78, pulse 69, temperature 98 F (36.7 C), temperature source Oral, resp. rate 20, height 6' (1.829 m), weight 86.2 kg, SpO2 100 %. Body mass index is 25.77 kg/m.  Mental Status Per Nursing [name]::   On Admission:  NA  Demographic factors:  Male, Unemployed Loss Factors:  Legal issues Historical Factors:  Impulsivity Risk Reduction Factors:  Positive social support    Continued Clinical Symptoms:  Psychotic symptoms resolved. Mood is stable. Denying any SI or HI.   Cognitive Features That Contribute To Risk:  None    Suicide Risk:  Mild:  There are no identifiable suicide plans, no associated intent, mild dysphoria and related symptoms, good self-control (both objective and subjective [name]), few other risk factors, and identifiable protective factors, including available and accessible social support.    Follow-up Information     [facility] Follow up.   Specialty: Behavioral Health Why: Please go to your provider for med managment on May 30th at 10:30am. Contact information: [address] [place] [address] [phone]                Plan Of Care/Follow-up recommendations:   -Follow-up with your outpatient psychiatric provider -instructions on appointment date,  time, and address (location) are provided to you in discharge paperwork.   -Take your psychiatric medications as prescribed at discharge - instructions are provided to you in the discharge paperwork   -Follow-up with outpatient primary care doctor and other specialists -for management of preventative medicine and chronic medical disease.   -Testing: Follow-up with outpatient provider for lab results:  Valproic acid level 02-02-23: 90   -Recommend abstinence from alcohol, tobacco, and other illicit drug use at discharge.    -If your psychiatric symptoms recur, worsen, or if you have side effects to your psychiatric medications, call your outpatient psychiatric provider, 911, 988 or go to the nearest emergency department.   -If suicidal thoughts recur, call your outpatient psychiatric provider, 911, 988 or go to the nearest emergency department.    Cristy Hilts, MD 02/02/2023, 9:57 AM

## 2023-02-02 NOTE — Discharge Summary (Signed)
Physician Discharge Summary Note  Patient:  Vincent [name] is an 27 y.o., male MRN:  [id number] DOB:  1996/01/21 Patient phone:  [phone] (home)  Patient address:   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 While at [facility] patient remained irate and agitated screaming stomping his feet; he mentioned needing an injection reportedly he was on Abilify Maintena 400 mg IM but has not received it recently. He receives outpatient services at the [facility] and agreed to have his injection; however due to his continued level of agitation he was transferred to [facility] for further stabilization. While at ED pt required multiple forced medications for continued agitation and paranoia. He was transferred to the inpatient floor 01/24/23 where upon arrival he continued to be very bizarre irritable aggressive pacing in the hallways sometimes rambling and very difficult to discern what he was saying.   Principal Problem: Schizoaffective disorder, bipolar type [facility]) Discharge Diagnoses: Principal Problem:   Schizoaffective disorder, bipolar type (HCC) Active Problems:   Cannabis use disorder, severe, dependence (HCC)   Past Psychiatric History:  Schizoaffective disorder, managed as outpatient with Abilify Maintena 400 mg IM LAI, last dose PTA was ?12-29-22?   Past Medical History:  Past Medical History:  Diagnosis Date   Anxiety     Schizophrenia (HCC)     Past Surgical History:  Procedure Laterality Date   MANDIBLE SURGERY     WRIST SURGERY     laceration by glass that pt sts was closed with surgery   Family History: History reviewed. No pertinent family history. Family Psychiatric  History:  No history of suicide attempts per H&P  Social History:  Social History   Substance and Sexual Activity  Alcohol Use Yes   Alcohol/week: 1.0 standard drink of alcohol   Types: 1 Shots of liquor per week   Comment: occasionally/ maybe once a month     Social History   Substance and Sexual Activity  Drug Use Yes   Types: Marijuana, Amphetamines, Cocaine   Comment: 1-2 zx per month    Social History   Socioeconomic History   Marital status: Single    Spouse name: Not on file   Number of children: Not on file   Years of education: Not on file   Highest education level: Not on file  Occupational History   Not on file  Tobacco Use   Smoking status: Every Day    Packs/day: 1.00    Years: 2.00    Additional pack years: 0.00    Total pack years: 2.00    Types: Cigarettes   Smokeless tobacco: Never  Vaping Use   Vaping Use: Never used  Substance and Sexual Activity   Alcohol use: Yes    Alcohol/week: 1.0 standard drink of alcohol    Types: 1 Shots of liquor per week    Comment: occasionally/ maybe once a month   Drug use: Yes    Types: Marijuana, Amphetamines, Cocaine    Comment: 1-2 zx per  month   Sexual activity: Not Currently    Birth control/protection: None  Other Topics Concern   Not on file  Social History Narrative   Not on file   Social Determinants of Health   Financial Resource Strain: Not on file  Food Insecurity: Food Insecurity Present (01/24/2023)   Hunger Vital Sign    Worried About Running Out of Food in the Last Year: Sometimes true    Ran Out of Food in the Last Year: Sometimes true  Transportation Needs: Unmet Transportation Needs (01/24/2023)   PRAPARE - Scientist, research (physical sciences) (Medical): Yes    Lack of Transportation (Non-Medical): Yes  Physical Activity: Not on file  Stress: Not on file  Social Connections: Not on file    Hospital Course:   During the patient's hospitalization, patient had extensive initial psychiatric evaluation, and follow-up psychiatric evaluations every day.  Psychiatric diagnoses provided upon initial assessment:  -Schizoaffective disorder bipolar type -GAD -Rule out TD on Ingrezza, no current TD symptoms, recently started Ingrezza  Patient's psychiatric medications were adjusted on admission:  Will discontinue Abilify 5 mg and start him on Zyprexa 10 twice daily for 4 to 5 days   During the hospitalization, other adjustments were made to the patient's psychiatric medication regimen:  -Abilify maintena 400 mg LAI was administered on 01-26-23. -zyprexa was incr to 10 mg bid, but then tapered off to reduce polypharmacy (transitioned to thorazine) -haldol was started but then tapered off to reduce polypharmacy (transitioned to thorazine) -thorazine was started and increased to 100 mg tid  -depakote was started, increased to DR 750 mg bid. VA level on 02-02-23 was: 90   Patient's care was discussed during the interdisciplinary team meeting every day during the hospitalization.  The patient denied having side effects to prescribed psychiatric medication.  Gradually, patient started adjusting to milieu. The patient was evaluated each day by a clinical provider to ascertain response to treatment. Improvement was noted by the patient's report of decreasing symptoms, improved sleep and appetite, affect, medication tolerance, behavior, and participation in unit programming.  Patient was asked each day to complete a self inventory noting mood, mental status, pain, new symptoms, anxiety and concerns.    Symptoms were reported as significantly decreased or resolved completely by discharge.   On day of discharge, the patient reports  that their mood is stable. The patient denied having suicidal thoughts for more than 48 hours prior to discharge.  Patient denies having homicidal thoughts.  Patient denies having auditory hallucinations.  Patient denies any visual hallucinations or other symptoms of psychosis. The patient was motivated to continue taking medication with a goal of continued improvement in mental health.   The patient reports their target psychiatric symptoms of psychosis and aggression, responded well to the psychiatric medications, and the patient reports overall benefit other psychiatric hospitalization. Supportive psychotherapy was provided to the patient. The patient also participated in regular group therapy while hospitalized. Coping skills, problem solving as well as relaxation therapies were also part of the unit programming.  Labs were reviewed with the patient, and abnormal results were discussed with the patient.  The patient is able to verbalize their individual safety plan to this provider.  # It is recommended to the patient to continue psychiatric medications as prescribed, after discharge from the hospital.    # It is recommended to the patient to follow up with your outpatient psychiatric provider and PCP.  # It was discussed with the patient, the  impact of alcohol, drugs, tobacco have been there overall psychiatric and medical wellbeing, and total abstinence from substance use was recommended the patient.ed.  # Prescriptions provided or sent directly to preferred pharmacy at discharge. Patient agreeable to plan. Given opportunity to ask questions. Appears to feel comfortable with discharge.    # In the event of worsening symptoms, the patient is instructed to call the crisis hotline, 911 and or go to the nearest ED for appropriate evaluation and treatment of symptoms. To follow-up with primary care provider for other medical issues, concerns and or health care needs  # Patient was discharged to  [facility], with a plan to follow up as noted below.   Physical Findings: AIMS: Facial and Oral Movements Muscles of Facial Expression: None, normal Lips and Perioral Area: None, normal Jaw: None, normal Tongue: None, normal,Extremity Movements Upper (arms, wrists, hands, fingers): None, normal Lower (legs, knees, ankles, toes): None, normal, Trunk Movements Neck, shoulders, hips: None, normal, Overall Severity Severity of abnormal movements (highest score from questions above): None, normal Incapacitation due to abnormal movements: None, normal Patient's awareness of abnormal movements (rate only patient's report): No Awareness, Dental Status Current problems with teeth and/or dentures?: No Does patient usually wear dentures?: No  CIWA:    COWS:     Aims score zero on my exam. No eps on my exam.  Musculoskeletal: Strength & Muscle Tone: within normal limits Gait & Station: normal Patient leans: N/A   Psychiatric Specialty Exam:  Presentation  General Appearance:  Appropriate for Environment; Casual; Fairly Groomed  Eye Contact: Good  Speech: Normal Rate; Clear and Coherent  Speech Volume: Normal  Handedness: Right   Mood and Affect  Mood: Euthymic  Affect: Appropriate; Congruent; Full Range   Thought Process  Thought Processes: Linear  Descriptions of Associations:Intact  Orientation:Full (Time, Place and Person)  Thought Content:Logical  History of Schizophrenia/Schizoaffective disorder:Yes  Duration of Psychotic Symptoms:Greater than six months  Hallucinations:Hallucinations: None  Ideas of Reference:None  Suicidal Thoughts:Suicidal Thoughts: No  Homicidal Thoughts:Homicidal Thoughts: No   Sensorium  Memory: Immediate Good; Recent Good; Remote Good  Judgment: Fair  Insight: Fair   Art therapist  Concentration: Fair  Attention Span: Fair  Recall: Good  Fund of Knowledge: Good  Language: Good   Psychomotor  Activity  Psychomotor Activity: Psychomotor Activity: Normal   Assets  Assets: Communication Skills; Physical Health; Resilience   Sleep  Sleep: Sleep: Fair    Physical Exam: Physical Exam Vitals reviewed.  Constitutional:      General: He is not in acute distress.    Appearance: He is normal weight. He is not toxic-appearing.  Pulmonary:     Effort: Pulmonary effort is normal. No respiratory distress.  Neurological:     Mental Status: He is alert.     Motor: No weakness.     Gait: Gait normal.  Psychiatric:        Mood and Affect: Mood normal.        Behavior: Behavior normal.        Thought Content: Thought content normal.        Judgment: Judgment normal.    Review of Systems  Constitutional:  Negative for chills and fever.  Cardiovascular:  Negative for chest pain and palpitations.  Neurological:  Negative for dizziness, tingling, tremors and headaches.  Psychiatric/Behavioral:  Negative for depression, hallucinations, memory loss, substance abuse and suicidal ideas. The patient is not nervous/anxious and does not have insomnia.   All other systems reviewed and  are negative.  Blood pressure 132/78, pulse 69, temperature 98 F (36.7 C), temperature source Oral, resp. rate 20, height 6' (1.829 m), weight 86.2 kg, SpO2 100 %. Body mass index is 25.77 kg/m.   Social History   Tobacco Use  Smoking Status Every Day   Packs/day: 1.00   Years: 2.00   Additional pack years: 0.00   Total pack years: 2.00   Types: Cigarettes  Smokeless Tobacco Never   Tobacco Cessation:  A prescription for an FDA-approved tobacco cessation medication provided at discharge   Blood Alcohol level:  Lab Results  Component Value Date   [facility] <10 01/23/2023   ETH <5 10/17/2014    Metabolic Disorder Labs:  Lab Results  Component Value Date   HGBA1C 5.7 (H) 01/28/2023   MPG 116.89 01/28/2023   MPG 114 10/21/2014   Lab Results  Component Value Date   PROLACTIN 0.8 (L)  08/04/2022   Lab Results  Component Value Date   CHOL 137 01/28/2023   TRIG 204 (H) 01/28/2023   HDL 48 01/28/2023   CHOLHDL 2.9 01/28/2023   VLDL 41 (H) 01/28/2023   LDLCALC 48 01/28/2023   LDLCALC 74 08/04/2022    See Psychiatric Specialty Exam and Suicide Risk Assessment completed by Attending Physician prior to discharge.  Discharge destination:  Other:  IR[facility]  Is patient on multiple antipsychotic therapies at discharge:  No   Has Patient had three or more failed trials of antipsychotic monotherapy by history:  Yes,   Antipsychotic medications that previously failed include:   1.  Abilify monotherapy., 2.  haldol., and 3.  zyprexa.  Recommended Plan for Multiple Antipsychotic Therapies: Additional reason(s) for multiple antispychotic treatment:  patient did not respond to multiple trials of monotherapy and dual therapy, until he was on a combination of abilify LAI and thorazine. If thorazine were to be dc, be cautious as his psychosis required dual antipsychotic therapy during his admission plus depakote.  Discharge Instructions     Diet - low sodium heart healthy   Complete by: As directed    Increase activity slowly   Complete by: As directed       Allergies as of 02/02/2023       Reactions   Shrimp [shellfish Allergy] Anaphylaxis        Medication List     STOP taking these medications    Abilify Maintena 400 MG Prsy prefilled syringe Generic drug: ARIPiprazole ER Replaced by: ARIPiprazole ER 400 MG Srer injection   amoxicillin-clavulanate 875-125 MG tablet Commonly known as: AUGMENTIN   benztropine 0.5 MG tablet Commonly known as: COGENTIN   ibuprofen 800 MG tablet Commonly known as: ADVIL       TAKE these medications      Indication  ARIPiprazole ER 400 MG Srer injection Commonly known as: ABILIFY MAINTENA Inject 2 mLs (400 mg total) into the muscle every 28 (twenty-eight) days for 1 dose. Next dose is due on 02-23-2023. Start taking on: February 23, 2023 Replaces: Abilify Maintena 400 MG Prsy prefilled syringe  Indication: Schizophrenia   chlorproMAZINE 100 MG tablet Commonly known as: THORAZINE Take 1 tablet (100 mg total) by mouth every 8 (eight) hours.  Indication: Schizophrenia   cloNIDine 0.2 MG tablet Commonly known as: CATAPRES Take 1 tablet (0.2 mg total) by mouth every 8 (eight) hours.  Indication: High Blood Pressure Disorder   divalproex 250 MG DR tablet Commonly known as: DEPAKOTE Take 3 tablets (750 mg total) by mouth every 12 (twelve) hours.  Indication: Manic Phase of Manic-Depression, Schizophrenia   gabapentin 300 MG capsule Commonly known as: NEURONTIN Take 2 capsules (600 mg total) by mouth 3 (three) times daily. What changed: how much to take  Indication: Generalized Anxiety Disorder, Psychomotor agitation on off label basis   nicotine 14 mg/24hr patch Commonly known as: NICODERM CQ - dosed in mg/24 hours Place 1 patch (14 mg total) onto the skin daily. Start taking on: Feb 03, 2023  Indication: Nicotine Addiction   trazodone 300 MG tablet Commonly known as: DESYREL Take 1 tablet (300 mg total) by mouth at bedtime.  Indication: Trouble Sleeping   valbenazine 40 MG capsule Commonly known as: Ingrezza Take 1 capsule (40 mg total) by mouth daily.  Indication: Tardive Dyskinesia        Follow-up Information     [facility] Follow up.   Specialty: Behavioral Health Why: Please go to your provider for med managment on May 30th at 10:30am. Contact information: [address] [place] [address] [phone]                Follow-up recommendations:    Activity: as tolerated  Diet: heart healthy  Other: -Follow-up with your outpatient psychiatric provider -instructions on appointment date, time, and address (location) are provided to you in discharge paperwork.  -Take your psychiatric medications as prescribed at discharge - instructions are  provided to you in the discharge paperwork  -Follow-up with outpatient primary care doctor and other specialists -for management of preventative medicine and chronic medical disease.  -Testing: Follow-up with outpatient provider for lab results:  Valproic acid level 02-02-23: 90  -Recommend abstinence from alcohol, tobacco, and other illicit drug use at discharge.   -If your psychiatric symptoms recur, worsen, or if you have side effects to your psychiatric medications, call your outpatient psychiatric provider, 911, 988 or go to the nearest emergency department.  -If suicidal thoughts recur, call your outpatient psychiatric provider, 911, 988 or go to the nearest emergency department.   Signed: Cristy Hilts, MD 02/02/2023, 9:49 AM   Total Time Spent in Direct Patient Care:  I personally spent 35 minutes on the unit in direct patient care. The direct patient care time included face-to-face time with the patient, reviewing the patient's chart, communicating with other professionals, and coordinating care. Greater than 50% of this time was spent in counseling or coordinating care with the patient regarding goals of hospitalization, psycho-education, and discharge planning needs.   Phineas Inches, MD Psychiatrist

## 2023-02-02 NOTE — Progress Notes (Signed)
Pt discharged to lobby. Pt was stable and appreciative at that time. All papers and prescriptions were given and valuables returned. Verbal understanding expressed. Denies SI/HI and A/VH. Pt given opportunity to express concerns and ask questions.  

## 2023-02-16 ENCOUNTER — Encounter (HOSPITAL_COMMUNITY): Payer: Medicaid Other | Admitting: Psychiatry

## 2023-08-02 ENCOUNTER — Encounter (HOSPITAL_COMMUNITY): Payer: Self-pay

## 2023-08-02 ENCOUNTER — Telehealth (HOSPITAL_COMMUNITY): Payer: MEDICAID | Admitting: Psychiatry
